# Patient Record
Sex: Female | Born: 1995 | Race: Black or African American | Hispanic: No | Marital: Single | Smoking: Never smoker
Health system: Southern US, Community
[De-identification: ages and names within clinical notes are randomized; demographics above are authoritative.]

## PROBLEM LIST (undated history)

## (undated) ENCOUNTER — Inpatient Hospital Stay (HOSPITAL_COMMUNITY): Payer: Self-pay

## (undated) ENCOUNTER — Ambulatory Visit (HOSPITAL_COMMUNITY): Source: Home / Self Care

## (undated) DIAGNOSIS — R011 Cardiac murmur, unspecified: Secondary | ICD-10-CM

## (undated) DIAGNOSIS — A749 Chlamydial infection, unspecified: Secondary | ICD-10-CM

## (undated) DIAGNOSIS — G43909 Migraine, unspecified, not intractable, without status migrainosus: Secondary | ICD-10-CM

## (undated) HISTORY — PX: NO PAST SURGERIES: SHX2092

---
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
## 2013-03-11 ENCOUNTER — Emergency Department (HOSPITAL_COMMUNITY)
Admission: EM | Admit: 2013-03-11 | Discharge: 2013-03-11 | Disposition: A | Discharge status: Home / Self Care | Payer: Self-pay | Attending: Emergency Medicine | Admitting: Emergency Medicine

## 2013-03-11 ENCOUNTER — Emergency Department (HOSPITAL_COMMUNITY): Payer: Self-pay

## 2013-03-11 ENCOUNTER — Encounter (HOSPITAL_COMMUNITY): Payer: Self-pay | Admitting: Emergency Medicine

## 2013-03-11 DIAGNOSIS — R112 Nausea with vomiting, unspecified: Secondary | ICD-10-CM | POA: Insufficient documentation

## 2013-03-11 DIAGNOSIS — N946 Dysmenorrhea, unspecified: Secondary | ICD-10-CM | POA: Insufficient documentation

## 2013-03-11 DIAGNOSIS — Z880 Allergy status to penicillin: Secondary | ICD-10-CM | POA: Insufficient documentation

## 2013-03-11 DIAGNOSIS — R102 Pelvic and perineal pain: Secondary | ICD-10-CM

## 2013-03-11 DIAGNOSIS — R011 Cardiac murmur, unspecified: Secondary | ICD-10-CM | POA: Insufficient documentation

## 2013-03-11 DIAGNOSIS — N949 Unspecified condition associated with female genital organs and menstrual cycle: Secondary | ICD-10-CM | POA: Insufficient documentation

## 2013-03-11 DIAGNOSIS — Z3202 Encounter for pregnancy test, result negative: Secondary | ICD-10-CM | POA: Insufficient documentation

## 2013-03-11 HISTORY — DX: Cardiac murmur, unspecified: R01.1

## 2013-03-11 LAB — CBC WITH DIFFERENTIAL/PLATELET
Basophils Absolute: 0 10*3/uL (ref 0.0–0.1)
Basophils Relative: 0 % (ref 0–1)
Eosinophils Absolute: 0 10*3/uL (ref 0.0–0.7)
Eosinophils Relative: 0 % (ref 0–5)
HCT: 41.4 % (ref 36.0–46.0)
Lymphs Abs: 0.9 10*3/uL (ref 0.7–4.0)
Neutrophils Relative %: 83 % — ABNORMAL HIGH (ref 43–77)
Platelets: 285 10*3/uL (ref 150–400)
RDW: 11.8 % (ref 11.5–15.5)

## 2013-03-11 LAB — WET PREP, GENITAL
Clue Cells Wet Prep HPF POC: NONE SEEN
Trich, Wet Prep: NONE SEEN
WBC, Wet Prep HPF POC: NONE SEEN

## 2013-03-11 LAB — BASIC METABOLIC PANEL
BUN: 10 mg/dL (ref 6–23)
Calcium: 10.1 mg/dL (ref 8.4–10.5)
Creatinine, Ser: 0.69 mg/dL (ref 0.50–1.10)
GFR calc Af Amer: >90 mL/min (ref >90)
GFR calc non Af Amer: >90 mL/min (ref >90)
Potassium: 3.9 mEq/L (ref 3.5–5.1)

## 2013-03-11 LAB — URINE MICROSCOPIC-ADD ON

## 2013-03-11 LAB — PREGNANCY, URINE: Preg Test, Ur: NEGATIVE

## 2013-03-11 LAB — URINALYSIS, ROUTINE W REFLEX MICROSCOPIC: Ketones, ur: 40 mg/dL — AB

## 2013-03-11 MED ORDER — MORPHINE SULFATE 4 MG/ML IJ SOLN
4.0000 mg | Freq: Once | INTRAMUSCULAR | Status: AC
  Administered 2013-03-11: 4 mg via INTRAVENOUS
  Filled 2013-03-11: qty 1

## 2013-03-11 MED ORDER — ONDANSETRON HCL 4 MG PO TABS: 4.0000 mg | ORAL_TABLET | Freq: Four times a day (QID) | ORAL | Status: DC

## 2013-03-11 MED ORDER — NAPROXEN 500 MG PO TABS: 500.0000 mg | ORAL_TABLET | Freq: Two times a day (BID) | ORAL | Status: DC

## 2013-03-11 NOTE — ED Provider Notes (Signed)
CSN: [id number]     Arrival date & time 03/11/13  1155 History   CC: ABD Pain  First MD Initiated Contact with Patient 03/11/13 1208     Chief Complaint  Patient presents with  . Emesis  . Abdominal Pain   (Consider location/radiation/quality/duration/timing/severity/associated sxs/prior Treatment) HPI patient is a 17 year old American female presents emergency department with chief complaint of abdominal pain and pelvic pain. She states the pain began approximately 3-4 days ago and has been increasing over the last few days. She states she normally gets pain during her menses however she is not currently menstruating and is late on her period. She says her periods are irregular and she took a pregnancy test last week but was non determinant.  Probably the pain is an ache and it is intermittent pelvis. Pain is rated at a 5/10 she has taken Tylenol with minimal relief. She denies fever, chills, chest pain, shortness of breath, she does report nausea with one episode of emesis nonbloody nonbilious, she denies frequency urgency dysuria, vaginal discharge or bleeding, or other associated symptoms.  Past Medical History  Diagnosis Date  . Heart murmur    Past Surgical History  Procedure Laterality Date  . Ovarian cyst removal     History reviewed. No pertinent family history. History  Substance Use Topics  . Smoking status: Never Smoker   . Smokeless tobacco: Not on file  . Alcohol Use: No   OB History   Grav Para Term Preterm Abortions TAB SAB Ect Mult Living                 Review of Systems  Allergies  Penicillins  Home Medications   Current Outpatient Rx  Name  Route  Sig  Dispense  Refill  . naproxen (NAPROSYN) 500 MG tablet   Oral   Take 1 tablet (500 mg total) by mouth 2 (two) times daily.   30 tablet   0   . ondansetron (ZOFRAN) 4 MG tablet   Oral   Take 1 tablet (4 mg total) by mouth every 6 (six) hours.   12 tablet   0    BP 113/72  Pulse 60  Temp(Src)  98.6 F (37 C) (Oral)  Resp 18  Ht 5\' 4"  (1.626 m)  SpO2 99% Physical Exam  Constitutional: She is oriented to person, place, and time. She appears well-developed and well-nourished.  HENT:  Head: Normocephalic and atraumatic.  Eyes: Conjunctivae are normal. Pupils are equal, round, and reactive to light.  Neck: Normal range of motion. Neck supple.  Cardiovascular: Normal rate, regular rhythm, normal heart sounds and intact distal pulses.   Pulmonary/Chest: Effort normal and breath sounds normal.  Abdominal: She exhibits no distension and no mass. There is tenderness. There is no rebound and no guarding.  Abdomen soft, no guarding rigidity or masses, no hepatosplenomegaly, but there is palpation McBurney's point, negative Murphy's,  Genitourinary: No vaginal discharge found.  Pelvic exam performed with a chaperone. Normal external female genitalia. Blood noted in the vaginal vault. Blood noted cervix. No active hemorrhaging. No lesions or discharge. Cervix closed. Bimanual exam with uterine tenderness to palpation and right greater than left adnexal tenderness to palpation. No masses appreciated  Musculoskeletal: Normal range of motion.  Neurological: She is alert and oriented to person, place, and time. She has normal reflexes.  Skin: Skin is warm and dry.   general patient is in no acute distress, she is a regular from bathroom and no distress is noted.  ED Course  17 year old with history of ovarian cysts. Presents with pelvic pain. Plan to obtain a hCG and perform pelvic exam and disobeys onto Procedures (including critical care time)  Labs Reviewed  CBC WITH DIFFERENTIAL - Abnormal; Notable for the following:    Neutrophils Relative % 83 (*)    All other components within normal limits  BASIC METABOLIC PANEL - Abnormal; Notable for the following:    Glucose, Bld 102 (*)    All other components within normal limits  URINALYSIS, ROUTINE W REFLEX MICROSCOPIC - Abnormal; Notable for  the following:    Color, Urine RED (*)    APPearance TURBID (*)    Hgb urine dipstick LARGE (*)    Ketones, ur 40 (*)    Protein, ur 100 (*)    Leukocytes, UA MODERATE (*)    All other components within normal limits  URINE MICROSCOPIC-ADD ON - Abnormal; Notable for the following:    Squamous Epithelial / LPF FEW (*)    All other components within normal limits  WET PREP, GENITAL  GC/CHLAMYDIA PROBE AMP  PREGNANCY, URINE   US Transvaginal Non-ob  03/11/2013   *RADIOLOGY REPORT*  Clinical Data:  17 year old female with pelvic pain, right greater than left.  TRANSABDOMINAL AND TRANSVAGINAL ULTRASOUND OF PELVIS DOPPLER ULTRASOUND OF OVARIES  Technique:  Both transabdominal and transvaginal ultrasound examinations of the pelvis were performed. Transabdominal technique was performed for global imaging of the pelvis including uterus, ovaries, adnexal regions, and pelvic cul-de-sac.  It was necessary to proceed with endovaginal exam following the transabdominal exam to visualize the ovaries and endometrium.  Color and duplex Doppler ultrasound was utilized to evaluate blood flow to the ovaries.  Comparison: None  Findings:  Uterus:  The uterus is anteverted measuring 7.9 x 3.8 x 3.9 cm.  No focal uterine abnormalities are identified.  Endometrium: The endometrium is normal in size and appearance measuring 7 mm in thickness.  Right ovary: The right ovary is unremarkable measuring 3.7 x 2.4 x 2.6 cm.  Normal color, Doppler and arterial/venous flow noted.  Left ovary: The left ovary is unremarkable measuring 2.6 x 1.6 x 1.3 cm.  Normal color, Doppler arterial/venous flow noted.  Pulsed Doppler evaluation demonstrates normal low-resistance arterial and venous waveforms in both ovaries.  There is no evidence of free fluid or adnexal mass.  IMPRESSION:  Normal exam.  No evidence of pelvic mass or other significant abnormality  No sonographic evidence for ovarian torsion.   Original Report Authenticated By: Harmon Pier, M.D.   US Pelvis Complete  03/11/2013   *RADIOLOGY REPORT*  Clinical Data:  17 year old female with pelvic pain, right greater than left.  TRANSABDOMINAL AND TRANSVAGINAL ULTRASOUND OF PELVIS DOPPLER ULTRASOUND OF OVARIES  Technique:  Both transabdominal and transvaginal ultrasound examinations of the pelvis were performed. Transabdominal technique was performed for global imaging of the pelvis including uterus, ovaries, adnexal regions, and pelvic cul-de-sac.  It was necessary to proceed with endovaginal exam following the transabdominal exam to visualize the ovaries and endometrium.  Color and duplex Doppler ultrasound was utilized to evaluate blood flow to the ovaries.  Comparison: None  Findings:  Uterus:  The uterus is anteverted measuring 7.9 x 3.8 x 3.9 cm.  No focal uterine abnormalities are identified.  Endometrium: The endometrium is normal in size and appearance measuring 7 mm in thickness.  Right ovary: The right ovary is unremarkable measuring 3.7 x 2.4 x 2.6 cm.  Normal color, Doppler and arterial/venous flow noted.  Left  ovary: The left ovary is unremarkable measuring 2.6 x 1.6 x 1.3 cm.  Normal color, Doppler arterial/venous flow noted.  Pulsed Doppler evaluation demonstrates normal low-resistance arterial and venous waveforms in both ovaries.  There is no evidence of free fluid or adnexal mass.  IMPRESSION:  Normal exam.  No evidence of pelvic mass or other significant abnormality  No sonographic evidence for ovarian torsion.   Original Report Authenticated By: Harmon Pier, M.D.   [place] Art/ven Flow Abd Pelv Doppler  03/11/2013   *RADIOLOGY REPORT*  Clinical Data:  17 year old female with pelvic pain, right greater than left.  TRANSABDOMINAL AND TRANSVAGINAL ULTRASOUND OF PELVIS DOPPLER ULTRASOUND OF OVARIES  Technique:  Both transabdominal and transvaginal ultrasound examinations of the pelvis were performed. Transabdominal technique was performed for global imaging of the pelvis including  uterus, ovaries, adnexal regions, and pelvic cul-de-sac.  It was necessary to proceed with endovaginal exam following the transabdominal exam to visualize the ovaries and endometrium.  Color and duplex Doppler ultrasound was utilized to evaluate blood flow to the ovaries.  Comparison: None  Findings:  Uterus:  The uterus is anteverted measuring 7.9 x 3.8 x 3.9 cm.  No focal uterine abnormalities are identified.  Endometrium: The endometrium is normal in size and appearance measuring 7 mm in thickness.  Right ovary: The right ovary is unremarkable measuring 3.7 x 2.4 x 2.6 cm.  Normal color, Doppler and arterial/venous flow noted.  Left ovary: The left ovary is unremarkable measuring 2.6 x 1.6 x 1.3 cm.  Normal color, Doppler arterial/venous flow noted.  Pulsed Doppler evaluation demonstrates normal low-resistance arterial and venous waveforms in both ovaries.  There is no evidence of free fluid or adnexal mass.  IMPRESSION:  Normal exam.  No evidence of pelvic mass or other significant abnormality  No sonographic evidence for ovarian torsion.   Original Report Authenticated By: Harmon Pier, M.D.   1. Pelvic pain   2. Dysmenorrhea     Results for orders placed during the hospital encounter of 03/11/13  WET PREP, GENITAL      Result Value Range   Yeast Wet Prep HPF POC NONE SEEN  NONE SEEN   Trich, Wet Prep NONE SEEN  NONE SEEN   Clue Cells Wet Prep HPF POC NONE SEEN  NONE SEEN   WBC, Wet Prep HPF POC NONE SEEN  NONE SEEN  CBC WITH DIFFERENTIAL      Result Value Range   WBC 6.9  4.0 - 10.5 K/uL   RBC 4.31  3.87 - 5.11 MIL/uL   Hemoglobin 14.4  12.0 - 15.0 g/dL   HCT 65.7  84.6 - 96.2 %   MCV 96.1  78.0 - 100.0 fL   MCH 33.4  26.0 - 34.0 pg   MCHC 34.8  30.0 - 36.0 g/dL   RDW 95.2  84.1 - 32.4 %   Platelets 285  150 - 400 K/uL   Neutrophils Relative % 83 (*) 43 - 77 %   Neutro Abs 5.7  1.7 - 7.7 K/uL   Lymphocytes Relative 13  12 - 46 %   Lymphs Abs 0.9  0.7 - 4.0 K/uL   Monocytes Relative 3   3 - 12 %   Monocytes Absolute 0.2  0.1 - 1.0 K/uL   Eosinophils Relative 0  0 - 5 %   Eosinophils Absolute 0.0  0.0 - 0.7 K/uL   Basophils Relative 0  0 - 1 %   Basophils Absolute 0.0  0.0 - 0.1 K/uL  BASIC METABOLIC PANEL      Result Value Range   Sodium 140  135 - 145 mEq/L   Potassium 3.9  3.5 - 5.1 mEq/L   Chloride 102  96 - 112 mEq/L   CO2 24  19 - 32 mEq/L   Glucose, Bld 102 (*) 70 - 99 mg/dL   BUN 10  6 - 23 mg/dL   Creatinine, Ser 1.61  0.50 - 1.10 mg/dL   Calcium 09.6  8.4 - 04.5 mg/dL   GFR calc non Af Amer >90  >90 mL/min   GFR calc Af Amer >90  >90 mL/min  PREGNANCY, URINE      Result Value Range   Preg Test, Ur NEGATIVE  NEGATIVE  URINALYSIS, ROUTINE W REFLEX MICROSCOPIC      Result Value Range   Color, Urine RED (*) YELLOW   APPearance TURBID (*) CLEAR   Specific Gravity, Urine 1.030  1.005 - 1.030   pH 7.5  5.0 - 8.0   Glucose, UA NEGATIVE  NEGATIVE mg/dL   Hgb urine dipstick LARGE (*) NEGATIVE   Bilirubin Urine NEGATIVE  NEGATIVE   Ketones, ur 40 (*) NEGATIVE mg/dL   Protein, ur 409 (*) NEGATIVE mg/dL   Urobilinogen, UA 1.0  0.0 - 1.0 mg/dL   Nitrite NEGATIVE  NEGATIVE   Leukocytes, UA MODERATE (*) NEGATIVE  URINE MICROSCOPIC-ADD ON      Result Value Range   Squamous Epithelial / LPF FEW (*) RARE   WBC, UA 7-10  <3 WBC/hpf   RBC / HPF TOO NUMEROUS TO COUNT  <3 RBC/hpf   Bacteria, UA RARE  RARE     MDM  17 year old female with pelvic pain. Workup negative for torsion, ectopic pregnancy, SBI, surgical abdomen, or emergent issue. I suspect her symptoms are multifactorial but likely related to her current menses.  Her vital signs are stable, lab work normal, ultrasound was negative.MDM Number of Diagnoses or Management Options   The patient appears reasonably screened and/or stabilized for discharge and I doubt any other medical condition or other [facility] requiring further screening, evaluation, or treatment in the ED at this time prior to discharge. Plan to  discharge with pain medications NSAIDs and Zofran. ER precautions were given the patient agrees with the plan.  Darlys Gales, MD 03/11/13 2145

## 2013-03-11 NOTE — ED Notes (Signed)
Pt c/o mid to lower abd pain starting today; pt sts LMP was beginning of July

## 2013-03-12 LAB — GC/CHLAMYDIA PROBE AMP: GC Probe RNA: NEGATIVE

## 2013-05-23 ENCOUNTER — Encounter (HOSPITAL_COMMUNITY): Payer: Self-pay

## 2013-05-23 ENCOUNTER — Inpatient Hospital Stay (HOSPITAL_COMMUNITY): Payer: Medicaid - Out of State

## 2013-05-23 ENCOUNTER — Inpatient Hospital Stay (HOSPITAL_COMMUNITY)
Admission: AD | Admit: 2013-05-23 | Discharge: 2013-05-23 | Disposition: A | Discharge status: Home / Self Care | Payer: Medicaid - Out of State | Source: Ambulatory Visit | Attending: Obstetrics & Gynecology | Admitting: Obstetrics & Gynecology

## 2013-05-23 DIAGNOSIS — A499 Bacterial infection, unspecified: Secondary | ICD-10-CM | POA: Insufficient documentation

## 2013-05-23 DIAGNOSIS — B9689 Other specified bacterial agents as the cause of diseases classified elsewhere: Secondary | ICD-10-CM | POA: Insufficient documentation

## 2013-05-23 DIAGNOSIS — O34599 Maternal care for other abnormalities of gravid uterus, unspecified trimester: Secondary | ICD-10-CM | POA: Insufficient documentation

## 2013-05-23 DIAGNOSIS — O2390 Unspecified genitourinary tract infection in pregnancy, unspecified trimester: Secondary | ICD-10-CM | POA: Insufficient documentation

## 2013-05-23 DIAGNOSIS — R109 Unspecified abdominal pain: Secondary | ICD-10-CM | POA: Insufficient documentation

## 2013-05-23 DIAGNOSIS — N760 Acute vaginitis: Secondary | ICD-10-CM

## 2013-05-23 DIAGNOSIS — Z3490 Encounter for supervision of normal pregnancy, unspecified, unspecified trimester: Secondary | ICD-10-CM

## 2013-05-23 DIAGNOSIS — N83209 Unspecified ovarian cyst, unspecified side: Secondary | ICD-10-CM

## 2013-05-23 LAB — URINALYSIS, ROUTINE W REFLEX MICROSCOPIC
Bilirubin Urine: NEGATIVE
Glucose, UA: NEGATIVE mg/dL
Ketones, ur: 15 mg/dL — AB
Protein, ur: NEGATIVE mg/dL
pH: 7.5 (ref 5.0–8.0)

## 2013-05-23 LAB — WET PREP, GENITAL: Trich, Wet Prep: NONE SEEN

## 2013-05-23 LAB — HCG, QUANTITATIVE, PREGNANCY: hCG, Beta Chain, Quant, S: 46472 m[IU]/mL — ABNORMAL HIGH (ref <5)

## 2013-05-23 LAB — CBC
MCHC: 35.3 g/dL (ref 30.0–36.0)
Platelets: 281 10*3/uL (ref 150–400)
RDW: 11.6 % (ref 11.5–15.5)
WBC: 4.2 10*3/uL (ref 4.0–10.5)

## 2013-05-23 LAB — ABO/RH: ABO/RH(D): B NEG

## 2013-05-23 MED ORDER — OXYCODONE-ACETAMINOPHEN 5-325 MG PO TABS: 1.0000 | ORAL_TABLET | ORAL | Status: DC | PRN

## 2013-05-23 MED ORDER — PROMETHAZINE HCL 25 MG PO TABS: 25.0000 mg | ORAL_TABLET | Freq: Four times a day (QID) | ORAL | Status: DC | PRN

## 2013-05-23 MED ORDER — ONDANSETRON HCL 4 MG PO TABS: 4.0000 mg | ORAL_TABLET | Freq: Three times a day (TID) | ORAL | Status: DC | PRN

## 2013-05-23 MED ORDER — ONDANSETRON 4 MG PO TBDP
4.0000 mg | ORAL_TABLET | Freq: Once | ORAL | Status: AC
  Administered 2013-05-23: 4 mg via ORAL
  Filled 2013-05-23: qty 1

## 2013-05-23 MED ORDER — METRONIDAZOLE 500 MG PO TABS: 500.0000 mg | ORAL_TABLET | Freq: Two times a day (BID) | ORAL | Status: DC

## 2013-05-23 NOTE — MAU Note (Signed)
Lower abd cramping is late for period.  No discharge.  Unsure if pregnant.

## 2013-05-23 NOTE — MAU Provider Note (Signed)
History     CSN: [id number]  Arrival date and time: 05/23/13 1311   First Provider Initiated Contact with Patient 05/23/13 1509      Chief Complaint  Patient presents with  . Abdominal Pain  . possible pregnant    HPI  Brandy [name] is a 17 y/o G1P0 w/ EGA of [redacted] weeks and 3 days who presents today with c/o nausea, abdominal cramping, and missed periods; LMP on 9/6.  Pt. States that early in October she began experiencing some mild abdominal cramping which she attributed to normal cramping that she experiences prior to normal menstruations.  Her cramping continued for another week and a half, but she still did not have her period.  She took a home pregnancy test 1-1.5 weeks ago, which was positive.  However, she did not think she was actually pregnant because a friend of hers told her they could be wrong. Pt. Also began experiencing N/V that began about a week ago, with once episode of emesis on 10/16.  UPT (+) in MAU, pt. Moderately surprised with results, but is okay with the news. She is unsure of her plans for prenatal care and is not taking PNV.  She denies any smoking, alcohol or drug use.   Her abdominal cramping is intermittent, and feels similar to cramping during menstruation. Brandy [name] states that the cramping is a 5-6/10 at its worst, but is currently a 0/10. She cannot correlate the cramping to any movement or activity, and she denies any unusual or change to activity level. She currently is experiencing mild nausea, but no other sx.  She reports decreased appetite for 2 weeks and minimal water intake, ~3-4 glasses /day; she last ate this morning around 10am.  She denies any CP, SOB, diarrhea, constipation, fever/chills, vaginal bleeding, vaginal discharge, or dysuria. She denies any pertinent medical history, but does report that she was recently diagnosed with a heart murmur in June of 2014, for which she saw a cardiologist; she is unsure of details, who she saw, or if she has to  f/u.    OB History   Grav Para Term Preterm Abortions TAB SAB Ect Mult Living   1               Past Medical History  Diagnosis Date  . Heart murmur     History reviewed. No pertinent past surgical history.  History reviewed. No pertinent family history.  History  Substance Use Topics  . Smoking status: Never Smoker   . Smokeless tobacco: Not on file  . Alcohol Use: No    Allergies:  Allergies  Allergen Reactions  . Penicillins Hives    No prescriptions prior to admission   Results for orders placed during the hospital encounter of 05/23/13 (from the past 24 hour(s))  URINALYSIS, ROUTINE W REFLEX MICROSCOPIC     Status: Abnormal   Collection Time    05/23/13  1:25 PM      Result Value Range   Color, Urine YELLOW  YELLOW   APPearance CLOUDY (*) CLEAR   Specific Gravity, Urine 1.025  1.005 - 1.030   pH 7.5  5.0 - 8.0   Glucose, UA NEGATIVE  NEGATIVE mg/dL   Hgb urine dipstick NEGATIVE  NEGATIVE   Bilirubin Urine NEGATIVE  NEGATIVE   Ketones, ur 15 (*) NEGATIVE mg/dL   Protein, ur NEGATIVE  NEGATIVE mg/dL   Urobilinogen, UA 2.0 (*) 0.0 - 1.0 mg/dL   Nitrite NEGATIVE  NEGATIVE   Leukocytes, UA  NEGATIVE  NEGATIVE  POCT PREGNANCY, URINE     Status: Abnormal   Collection Time    05/23/13  1:46 PM      Result Value Range   Preg Test, Ur POSITIVE (*) NEGATIVE  WET PREP, GENITAL     Status: Abnormal   Collection Time    05/23/13  4:21 PM      Result Value Range   Yeast Wet Prep HPF POC NONE SEEN  NONE SEEN   Trich, Wet Prep NONE SEEN  NONE SEEN   Clue Cells Wet Prep HPF POC MODERATE (*) NONE SEEN   WBC, Wet Prep HPF POC FEW (*) NONE SEEN  HCG, QUANTITATIVE, PREGNANCY     Status: Abnormal   Collection Time    05/23/13  4:41 PM      Result Value Range   hCG, Beta Chain, Sharene Butters, [place] [address] (*) <5 mIU/mL  CBC     Status: Abnormal   Collection Time    05/23/13  4:42 PM      Result Value Range   WBC 4.2  4.0 - 10.5 K/uL   RBC 3.74 (*) 3.87 - 5.11 MIL/uL    Hemoglobin 12.3  12.0 - 15.0 g/dL   HCT 19.1 (*) 47.8 - 29.5 %   MCV 93.0  78.0 - 100.0 fL   MCH 32.9  26.0 - 34.0 pg   MCHC 35.3  30.0 - 36.0 g/dL   RDW 62.1  30.8 - 65.7 %   Platelets 281  150 - 400 K/uL  ABO/RH     Status: None   Collection Time    05/23/13  4:42 PM      Result Value Range   ABO/RH(D) B NEG     US Ob Comp Less 14 Wks  05/23/2013   CLINICAL DATA:  Positive pregnancy test, pelvic pain  EXAM: OBSTETRIC <14 WK ULTRASOUND  TECHNIQUE: Transabdominal ultrasound was performed for evaluation of the gestation as well as the maternal uterus and adnexal regions.  COMPARISON:  None for this pregnancy.  FINDINGS: Intrauterine gestational sac: Visualized/normal in shape.  Yolk sac:  Visualized  Embryo:  Visualized  Cardiac Activity: Visualized  Heart Rate: 125 bpm  CRL:   7  mm   6 w 5d                  [place] EDC: 01/11/14  Maternal uterus/adnexae: The right ovary is normal. Simple appearing left ovarian cyst measures 4.9 x 4.7 x 4.4 cm.  IMPRESSION: Single live intrauterine gestation with MEASUREMENTS by crown-rump length concordant with assigned gestational age of [redacted] weeks 3 days by LMP.  4.9 cm simple appearing left ovarian cyst. If the patient is symptomatic on the left, this theoretically could act as a lead point for ovarian torsion, and close followup may be needed as clinically indicated. Otherwise, this can be reassessed at the time of fetal anatomic survey at 18-[redacted] weeks gestational age.   Electronically Signed   By: Christiana Pellant M.D.   On: 05/23/2013 17:13    US Ob Comp Less 14 Wks  05/23/2013   CLINICAL DATA:  Positive pregnancy test, pelvic pain  EXAM: OBSTETRIC <14 WK ULTRASOUND  TECHNIQUE: Transabdominal ultrasound was performed for evaluation of the gestation as well as the maternal uterus and adnexal regions.  COMPARISON:  None for this pregnancy.  FINDINGS: Intrauterine gestational sac: Visualized/normal in shape.  Yolk sac:  Visualized  Embryo:  Visualized  Cardiac Activity:  Visualized  Heart Rate: 125  bpm  CRL:   7  mm   6 w 5d                  [place] EDC: 01/11/14  Maternal uterus/adnexae: The right ovary is normal. Simple appearing left ovarian cyst measures 4.9 x 4.7 x 4.4 cm.  IMPRESSION: Single live intrauterine gestation with MEASUREMENTS by crown-rump length concordant with assigned gestational age of [redacted] weeks 3 days by LMP.  4.9 cm simple appearing left ovarian cyst. If the patient is symptomatic on the left, this theoretically could act as a lead point for ovarian torsion, and close followup may be needed as clinically indicated. Otherwise, this can be reassessed at the time of fetal anatomic survey at 18-[redacted] weeks gestational age.   Electronically Signed   By: Christiana Pellant M.D.   On: 05/23/2013 17:13   US Ob Transvaginal  05/23/2013   CLINICAL DATA:  Patient [redacted] weeks pregnant by ultrasound same day. Left ovarian cyst.  EXAM: TRANSVAGINAL ULTRASOUND OF PELVIS  DOPPLER ULTRASOUND OF OVARIES  TECHNIQUE: Transvaginal ultrasound examination of the pelvis was performed including evaluation of the uterus, ovaries, adnexal regions, and pelvic cul-de-sac.  Color and duplex Doppler ultrasound was utilized to evaluate blood flow to the ovaries.  COMPARISON:  Transabdominal ultrasound 05/23/2013  FINDINGS: Uterus  Measurements: Single intrauterine gestation with normal cardiac activity with heart at 124 beats per min. Crown-rump length is 0.65 mm for a estimated gestational age is 6 weeks 4 days.  Right ovary  Measurements: Normal. Normal arterial and venous waveforms.  Left ovary  Measurements: Large anechoic cyst associated with the left ovary measuring 5.7 x 4.1 x 4.7 cm. There is normal color Doppler flow to the left ovary. Normal arterial and venous waveforms in the left ovary.  Pulsed Doppler evaluation demonstrates normal low-resistance arterial and venous waveforms in both ovaries.  IMPRESSION: 1. Normal arterial and venous blood flow to the left and right ovary. No evidence of  torsion.  2. Large anechoic cyst associated with the left ovary measuring 5.7 cm.  3. Single intrauterine gestation with normal cardiac activity described on comparison abdominal ultrasound same day.   Electronically Signed   By: Genevive Bi M.D.   On: 05/23/2013 18:05   [place] Art/ven Flow Abd Pelv Doppler  05/23/2013   CLINICAL DATA:  Patient [redacted] weeks pregnant by ultrasound same day. Left ovarian cyst.  EXAM: TRANSVAGINAL ULTRASOUND OF PELVIS  DOPPLER ULTRASOUND OF OVARIES  TECHNIQUE: Transvaginal ultrasound examination of the pelvis was performed including evaluation of the uterus, ovaries, adnexal regions, and pelvic cul-de-sac.  Color and duplex Doppler ultrasound was utilized to evaluate blood flow to the ovaries.  COMPARISON:  Transabdominal ultrasound 05/23/2013  FINDINGS: Uterus  Measurements: Single intrauterine gestation with normal cardiac activity with heart at 124 beats per min. Crown-rump length is 0.65 mm for a estimated gestational age is 6 weeks 4 days.  Right ovary  Measurements: Normal. Normal arterial and venous waveforms.  Left ovary  Measurements: Large anechoic cyst associated with the left ovary measuring 5.7 x 4.1 x 4.7 cm. There is normal color Doppler flow to the left ovary. Normal arterial and venous waveforms in the left ovary.  Pulsed Doppler evaluation demonstrates normal low-resistance arterial and venous waveforms in both ovaries.  IMPRESSION: 1. Normal arterial and venous blood flow to the left and right ovary. No evidence of torsion.  2. Large anechoic cyst associated with the left ovary measuring 5.7 cm.  3. Single intrauterine gestation with normal  cardiac activity described on comparison abdominal ultrasound same day.   Electronically Signed   By: Genevive Bi M.D.   On: 05/23/2013 18:05    Review of Systems  Constitutional: Negative for fever and chills.  Respiratory: Negative for cough.   Cardiovascular: Negative for chest pain.  Gastrointestinal: Positive for  nausea. Negative for heartburn, vomiting, abdominal pain, diarrhea and constipation.  Genitourinary: Negative for dysuria.  Neurological: Negative for headaches.   Physical Exam   Blood pressure 112/60, pulse 83, temperature 99 F (37.2 C), temperature source Oral, resp. rate 16, height 5\' 3"  (1.6 m), weight 125 lb 6.4 oz (56.881 kg), last menstrual period 04/08/2013.  Physical Exam  Constitutional: She is oriented to person, place, and time. She appears well-developed and well-nourished.  HENT:  Head: Normocephalic.  Neck: Neck supple.  Cardiovascular: Normal rate, regular rhythm and intact distal pulses.  Exam reveals no gallop and no friction rub.   Murmur heard. Late diastolic III/VI murmur heard best at RUSB  Respiratory: Effort normal and breath sounds normal. She has no wheezes.  GI: Soft. Bowel sounds are normal. She exhibits no distension and no mass. There is tenderness. There is no rebound and no guarding.  + suprapubic tenderness  + left lower quadrant pain   Genitourinary: No vaginal discharge found.  Speculum exam: Vagina - Small amount of creamy discharge, no odor Cervix - No contact bleeding Bimanual exam: Cervix closed Uterus non tender, normal size for gestational age Adnexa non tender, no masses bilaterally, suprapubic tenderness.  GC/Chlam, wet prep done Chaperone present for exam.   Neurological: She is alert and oriented to person, place, and time.  Skin: Skin is warm.    MAU Course  Procedures None   MDM Wet prep GC/Chlamydia  CBC  ABO/RH Beta Hcg [place]   B negative blood type   Given Dr. Amanda Cockayne report; I have sent patient back for doppler of left ovary.  Consulted with Dr. Erin Fulling; plan of care discussed  Assessment and Plan  A- IUP with cardiac activity US done 10/21 Left ovarian cyst 4.9 cm, no sign of ovarian torsion  Bacterial vaginosis   P- Discharge home RX: percocet 5/325        Flagyl 500 mg BID # 14        Zofran          Phenergan  Return to MAU with worsening abdominal pain, or vaginal bleeding  Return to MAU with worsening Nausea and vomiting   Christiana Fuchs, Student-PA 05/23/2013 3:44 PM  Evaluation and management procedures were performed by the student above under my supervision and collaboration. I have reviewed the note and chart, and I agree with the management and plan.  RASCH, JENNIFER IRENE FNP-C  05/23/2013, 6:23 PM

## 2013-05-23 NOTE — MAU Provider Note (Signed)
Attestation of Attending Supervision of Advanced Practitioner (CNM/NP): Evaluation and management procedures were performed by the Advanced Practitioner under my supervision and collaboration.  I have reviewed the Advanced Practitioner's note and chart, and I agree with the management and plan.  HARRAWAY-SMITH, Raven Furnas 7:17 PM

## 2013-05-23 NOTE — MAU Note (Signed)
Patient is in with c/o lower abdominal cramping and a positive home pregnancy test. She denies vaginal bleeding or abnormal vaginal discharge. Patient will like an ultrasound to determine how far along she is. She states that she will get prenatal care from Dr Gaynell Face once she gets her medicaid card.

## 2013-05-24 LAB — GC/CHLAMYDIA PROBE AMP
CT Probe RNA: NEGATIVE
GC Probe RNA: NEGATIVE

## 2014-03-28 ENCOUNTER — Encounter (HOSPITAL_COMMUNITY): Payer: Self-pay | Admitting: *Deleted

## 2014-04-10 ENCOUNTER — Inpatient Hospital Stay (HOSPITAL_COMMUNITY)
Admission: AD | Admit: 2014-04-10 | Discharge: 2014-04-11 | Disposition: A | Discharge status: Home / Self Care | Payer: Medicaid - Out of State | Source: Ambulatory Visit | Attending: Family Medicine | Admitting: Family Medicine

## 2014-04-10 DIAGNOSIS — Z3202 Encounter for pregnancy test, result negative: Secondary | ICD-10-CM | POA: Insufficient documentation

## 2014-04-10 DIAGNOSIS — R109 Unspecified abdominal pain: Secondary | ICD-10-CM | POA: Insufficient documentation

## 2014-04-11 ENCOUNTER — Encounter (HOSPITAL_COMMUNITY): Payer: Self-pay | Admitting: *Deleted

## 2014-04-11 DIAGNOSIS — Z3202 Encounter for pregnancy test, result negative: Secondary | ICD-10-CM | POA: Diagnosis not present

## 2014-04-11 DIAGNOSIS — R109 Unspecified abdominal pain: Secondary | ICD-10-CM | POA: Diagnosis not present

## 2014-04-11 LAB — URINALYSIS, ROUTINE W REFLEX MICROSCOPIC
Bilirubin Urine: NEGATIVE
GLUCOSE, UA: NEGATIVE mg/dL
HGB URINE DIPSTICK: NEGATIVE
Ketones, ur: NEGATIVE mg/dL
LEUKOCYTES UA: NEGATIVE
Nitrite: NEGATIVE
Protein, ur: NEGATIVE mg/dL
SPECIFIC GRAVITY, URINE: 1.02 (ref 1.005–1.030)
Urobilinogen, UA: 1 mg/dL (ref 0.0–1.0)
pH: 6 (ref 5.0–8.0)

## 2014-04-11 LAB — POCT PREGNANCY, URINE: PREG TEST UR: NEGATIVE

## 2014-04-11 NOTE — MAU Provider Note (Signed)
History     CSN: [id number]  Arrival date and time: 04/10/14 2349   First Provider Initiated Contact with Patient 04/11/14 0340      No chief complaint on file.  HPI  Brandy [name] is a 18 y.o. G1P1001 who presents today with lower abdominal pain and missed period. She states that she had a depo shot at the end of May, and was due for one at the end of August. She did not get that shot. She was concerned she was pregnant. She did not take a pregnancy test at home. She is here today to find out if she is pregnant.   Past Medical History  Diagnosis Date  . Heart murmur     History reviewed. No pertinent past surgical history.  Family History  Problem Relation Age of Onset  . Asthma Mother     History  Substance Use Topics  . Smoking status: Never Smoker   . Smokeless tobacco: Never Used  . Alcohol Use: No    Allergies:  Allergies  Allergen Reactions  . Penicillins Hives    Prescriptions prior to admission  Medication Sig Dispense Refill  . metroNIDAZOLE (FLAGYL) 500 MG tablet Take 1 tablet (500 mg total) by mouth 2 (two) times daily.  14 tablet  0  . ondansetron (ZOFRAN) 4 MG tablet Take 1 tablet (4 mg total) by mouth every 8 (eight) hours as needed for nausea.  20 tablet  0  . oxyCODONE-acetaminophen (PERCOCET/ROXICET) 5-325 MG per tablet Take 1 tablet by mouth every 4 (four) hours as needed for pain.  10 tablet  0  . promethazine (PHENERGAN) 25 MG tablet Take 1 tablet (25 mg total) by mouth every 6 (six) hours as needed for nausea.  20 tablet  0    ROS Physical Exam   Blood pressure 112/71, pulse 86, temperature 98.7 F (37.1 C), temperature source Oral, resp. rate 18, height  (1.575 m), weight 55.112 kg (121 lb 8 oz), last menstrual period 03/03/2014, unknown if currently breastfeeding.  Physical Exam  Nursing note and vitals reviewed. Constitutional: She appears well-developed. No distress.  Cardiovascular: Normal rate.   Respiratory: Effort normal.   GI: Soft. There is no tenderness.  Neurological: She is alert.  Skin: Skin is warm and dry.  Psychiatric: She has a normal mood and affect.    MAU Course  Procedures  Results for orders placed during the hospital encounter of 04/10/14 (from the past 24 hour(s))  URINALYSIS, ROUTINE W REFLEX MICROSCOPIC     Status: None   Collection Time    04/11/14  1:36 AM      Result Value Ref Range   Color, Urine YELLOW  YELLOW   APPearance CLEAR  CLEAR   Specific Gravity, Urine 1.020  1.005 - 1.030   pH 6.0  5.0 - 8.0   Glucose, UA NEGATIVE  NEGATIVE mg/dL   Hgb urine dipstick NEGATIVE  NEGATIVE   Bilirubin Urine NEGATIVE  NEGATIVE   Ketones, ur NEGATIVE  NEGATIVE mg/dL   Protein, ur NEGATIVE  NEGATIVE mg/dL   Urobilinogen, UA 1.0  0.0 - 1.0 mg/dL   Nitrite NEGATIVE  NEGATIVE   Leukocytes, UA NEGATIVE  NEGATIVE  POCT PREGNANCY, URINE     Status: None   Collection Time    04/11/14  1:45 AM      Result Value Ref Range   Preg Test, Ur NEGATIVE  NEGATIVE    Assessment and Plan   1. Encounter for pregnancy test with  result negative    Patient has a plan to go to [facility] for depo injection Return to MAU as needed  Follow-up Information   Schedule an appointment as soon as possible for a visit with [facility].   Contact information:   [address] [place] [address]        Tawnya Crook 04/11/2014, 3:42 AM

## 2014-04-11 NOTE — MAU Note (Signed)
NOT IN LOBBY 

## 2014-04-11 NOTE — MAU Note (Signed)
PT SAYS SHE STARTED HAVING LOWER ABD PAIN - 8-28. BIRTH CONTROL-  DEPO- LAST  SHOT 12-27-2013.   LAST SEX-  Friday.     NO HPT.    WAS HERE LAST YEAR FOR ABD   PAIN- -  WAS A CYST.    LAST APPOINTMENT AND DELIVERED IN [place].        NO GYN DR HERE.

## 2014-04-11 NOTE — Discharge Instructions (Signed)

## 2014-04-12 NOTE — MAU Provider Note (Signed)
Attestation of Attending Supervision of Advanced Practitioner (PA/CNM/NP): Evaluation and management procedures were performed by the Advanced Practitioner under my supervision and collaboration.  I have reviewed the Advanced Practitioner's note and chart, and I agree with the management and plan.  Aarik Blank, DO Attending Physician Faculty Practice, [facility]  

## 2014-06-04 ENCOUNTER — Encounter (HOSPITAL_COMMUNITY): Payer: Self-pay | Admitting: *Deleted

## 2014-07-21 ENCOUNTER — Encounter (HOSPITAL_COMMUNITY): Payer: Self-pay

## 2014-07-21 ENCOUNTER — Emergency Department (HOSPITAL_COMMUNITY)
Admission: EM | Admit: 2014-07-21 | Discharge: 2014-07-22 | Disposition: A | Discharge status: Home / Self Care | Payer: Medicaid Other | Attending: Emergency Medicine | Admitting: Emergency Medicine

## 2014-07-21 DIAGNOSIS — H1089 Other conjunctivitis: Secondary | ICD-10-CM | POA: Diagnosis not present

## 2014-07-21 DIAGNOSIS — Z880 Allergy status to penicillin: Secondary | ICD-10-CM | POA: Diagnosis not present

## 2014-07-21 DIAGNOSIS — R011 Cardiac murmur, unspecified: Secondary | ICD-10-CM | POA: Insufficient documentation

## 2014-07-21 DIAGNOSIS — H109 Unspecified conjunctivitis: Secondary | ICD-10-CM

## 2014-07-21 DIAGNOSIS — Z792 Long term (current) use of antibiotics: Secondary | ICD-10-CM | POA: Insufficient documentation

## 2014-07-21 DIAGNOSIS — H578 Other specified disorders of eye and adnexa: Secondary | ICD-10-CM | POA: Diagnosis present

## 2014-07-21 NOTE — ED Notes (Signed)
Pt reports eye swelling, pain and redness that began this morning.  Pt reports she has had cold symptoms x1 week.

## 2014-07-21 NOTE — ED Provider Notes (Signed)
CSN: [id number]     Arrival date & time 07/21/14  2321 History   This chart was scribed for non-physician practitioner working with Dione Boozeavid Glick, MD by Richarda Overlieichard Holland, ED Scribe. This patient was seen in room TR04C/TR04C and the patient's care was started at 11:59 PM.    Chief Complaint  Patient presents with  . Eye Problem   HPI HPI Comments: Brandy [name] is a 18 y.o. female who presents to the Emergency Department complaining of right eye swelling that began this morning. Pt reports she has had cold symptoms for the last week. She states she has had chills and has blurry vision out of her right eye and is experiencing pain and redness. Pt reports she has used ibuprofen and theraflu and neither provided relief to her symptoms. Pt states she does not wear contacts. She denies any known sick contacts. Pt reports no pertinent past medical history at this time. She reports she is allergic to penicillins. She reports no modifying factors currently.  Past Medical History  Diagnosis Date  . Heart murmur    History reviewed. No pertinent past surgical history. Family History  Problem Relation Age of Onset  . Asthma Mother    History  Substance Use Topics  . Smoking status: Never Smoker   . Smokeless tobacco: Never Used  . Alcohol Use: No   OB History    Gravida Para Term Preterm AB TAB SAB Ectopic Multiple Living   1 1 1       1      Review of Systems  HENT: Positive for congestion and rhinorrhea.   Eyes: Positive for discharge, redness and itching.  All other systems reviewed and are negative.     Allergies  Penicillins  Home Medications   Prior to Admission medications   Medication Sig Start Date End Date Taking? Authorizing Provider  metroNIDAZOLE (FLAGYL) 500 MG tablet Take 1 tablet (500 mg total) by mouth 2 (two) times daily. 05/23/13   Iona HansenJennifer Irene Rasch, NP  ondansetron (ZOFRAN) 4 MG tablet Take 1 tablet (4 mg total) by mouth every 8 (eight) hours as needed for  nausea. 05/23/13   Debbrah AlarJennifer Irene Rasch, NP  oxyCODONE-acetaminophen (PERCOCET/ROXICET) 5-325 MG per tablet Take 1 tablet by mouth every 4 (four) hours as needed for pain. 05/23/13   Debbrah AlarJennifer Irene Rasch, NP  promethazine (PHENERGAN) 25 MG tablet Take 1 tablet (25 mg total) by mouth every 6 (six) hours as needed for nausea. 05/23/13   Debbrah AlarJennifer Irene Rasch, NP  trimethoprim-polymyxin b (POLYTRIM) ophthalmic solution Place 1 drop into the right eye every 6 (six) hours. 07/22/14   Betzaida Cremeens L Korry Dalgleish, PA-C   BP 104/60 mmHg  Pulse 68  Temp(Src) 98.5 F (36.9 C) (Oral)  Resp 18  Ht 5\' 4"  (1.626 m)  Wt 118 lb (53.524 kg)  BMI 20.24 kg/m2  SpO2 99%  LMP 07/21/2014 Physical Exam  Constitutional: She is oriented to person, place, and time. She appears well-developed and well-nourished. No distress.  HENT:  Head: Normocephalic and atraumatic.  Right Ear: External ear normal.  Left Ear: External ear normal.  Nose: Nose normal.  Mouth/Throat: Oropharynx is clear and moist.  Eyes: EOM are normal. Pupils are equal, round, and reactive to light. Right eye exhibits discharge (dried purulent drainage). Right conjunctiva is injected. Right conjunctiva has no hemorrhage.  Neck: Normal range of motion. Neck supple.  Cardiovascular: Normal rate, regular rhythm and normal heart sounds.   Pulmonary/Chest: Effort normal and breath sounds normal. No respiratory  distress.  Abdominal: Soft.  Musculoskeletal: Normal range of motion.  Lymphadenopathy:    She has no cervical adenopathy.  Neurological: She is alert and oriented to person, place, and time.  Skin: Skin is warm and dry. She is not diaphoretic.  Psychiatric: She has a normal mood and affect.  Nursing note and vitals reviewed.   ED Course  Procedures   DIAGNOSTIC STUDIES: Oxygen Saturation is 99% on RA, normal by my interpretation.    COORDINATION OF CARE: 1:05 AM Discussed treatment plan with pt at bedside and pt agreed to plan.   Labs  Review Labs Reviewed - No data to display  Imaging Review No results found.   EKG Interpretation None      MDM   Final diagnoses:  Bacterial conjunctivitis of right eye   Filed Vitals:   07/21/14 2340  BP: 104/60  Pulse: 68  Temp: 98.5 F (36.9 C)  Resp: 18   Afebrile, NAD, non-toxic appearing, AAOx4.    Patient presentation consistent with bacterial conjunctivitis.  No entrapment, consensual photophobia.  Presentation non-concerning for iritis, corneal abrasions, or HSV.  No antibiotics are indicated and patient will be prescribed polytrim.  Personal hygiene and frequent handwashing discussed.  Patient advised to followup with ophthalmologist if symptoms persist or worsen in any way including vision change.  Patient verbalizes understanding and is agreeable with discharge.    I personally performed the services described in this documentation, which was scribed in my presence. The recorded information has been reviewed and is accurate.       Jeannetta EllisJennifer L Jeronica Stlouis, PA-C 07/22/14 0106  Dione Boozeavid Glick, MD 07/22/14 [phone]

## 2014-07-22 MED ORDER — POLYMYXIN B-TRIMETHOPRIM 10000-0.1 UNIT/ML-% OP SOLN: 1.0000 [drp] | Freq: Four times a day (QID) | OPHTHALMIC | Status: DC

## 2014-07-22 NOTE — Discharge Instructions (Signed)
Please follow up with your primary care physician in 1-2 days. If you do not have one please call the [facility] number listed above. Please take your antibiotic until completion. Please read all discharge instructions and return precautions.    Bacterial Conjunctivitis Bacterial conjunctivitis, commonly called pink eye, is an inflammation of the clear membrane that covers the white part of the eye (conjunctiva). The inflammation can also happen on the underside of the eyelids. The blood vessels in the conjunctiva become inflamed, causing the eye to become red or pink. Bacterial conjunctivitis may spread easily from one eye to another and from person to person (contagious).  CAUSES  Bacterial conjunctivitis is caused by bacteria. The bacteria may come from your own skin, your upper respiratory tract, or from someone else with bacterial conjunctivitis. SYMPTOMS  The normally white color of the eye or the underside of the eyelid is usually pink or red. The pink eye is usually associated with irritation, tearing, and some sensitivity to light. Bacterial conjunctivitis is often associated with a thick, yellowish discharge from the eye. The discharge may turn into a crust on the eyelids overnight, which causes your eyelids to stick together. If a discharge is present, there may also be some blurred vision in the affected eye. DIAGNOSIS  Bacterial conjunctivitis is diagnosed by your caregiver through an eye exam and the symptoms that you report. Your caregiver looks for changes in the surface tissues of your eyes, which may point to the specific type of conjunctivitis. A sample of any discharge may be collected on a cotton-tip swab if you have a severe case of conjunctivitis, if your cornea is affected, or if you keep getting repeat infections that do not respond to treatment. The sample will be sent to a lab to see if the inflammation is caused by a bacterial infection and to see if the  infection will respond to antibiotic medicines. TREATMENT   Bacterial conjunctivitis is treated with antibiotics. Antibiotic eyedrops are most often used. However, antibiotic ointments are also available. Antibiotics pills are sometimes used. Artificial tears or eye washes may ease discomfort. HOME CARE INSTRUCTIONS   To ease discomfort, apply a cool, clean washcloth to your eye for 10-20 minutes, 3-4 times a day.  Gently wipe away any drainage from your eye with a warm, wet washcloth or a cotton ball.  Wash your hands often with soap and water. Use paper towels to dry your hands.  Do not share towels or washcloths. This may spread the infection.  Change or wash your pillowcase every day.  You should not use eye makeup until the infection is gone.  Do not operate machinery or drive if your vision is blurred.  Stop using contact lenses. Ask your caregiver how to sterilize or replace your contacts before using them again. This depends on the type of contact lenses that you use.  When applying medicine to the infected eye, do not touch the edge of your eyelid with the eyedrop bottle or ointment tube. SEEK IMMEDIATE MEDICAL CARE IF:   Your infection has not improved within 3 days after beginning treatment.  You had yellow discharge from your eye and it returns.  You have increased eye pain.  Your eye redness is spreading.  Your vision becomes blurred.  You have a fever or persistent symptoms for more than 2-3 days.  You have a fever and your symptoms suddenly get worse.  You have facial pain, redness, or swelling. MAKE SURE YOU:  Understand these instructions.  Will watch your condition.  Will get help right away if you are not doing well or get worse. Document Released: 07/20/2005 Document Revised: 12/04/2013 Document Reviewed: 12/21/2011 [facility] Patient Information 2015 [place], [place]. This information is not intended to replace advice given to you by your health care  provider. Make sure you discuss any questions you have with your health care provider.

## 2014-08-03 NOTE — L&D Delivery Note (Cosign Needed)
Delivery Notet  After a 10 minute 2nsd stage, at 9:52 PM a viable female was delivered via Vaginal, Spontaneous Delivery (Presentation: ; LOP).  APGAR: 9/9, ; weight  Pending.  After 3 minutes, the cord was clamped and cut. 40 units of pitocin diluted in 1000cc LR was infused rapidly IV.  The placenta separated spontaneously and delivered via CCT and maternal pushing effort.  It was inspected and appears to be intact with a 3 VC.   Anesthesia:  epidural Episiotomy:  none Lacerations:  none Suture Repair: n/a Est. Blood Loss (mL):  150  Mom to postpartum.  Baby to Couplet care / Skin to Skin.  [name],Brandy Urbas 07/04/2015, 10:08 PM

## 2014-09-23 ENCOUNTER — Encounter (HOSPITAL_COMMUNITY): Payer: Self-pay | Admitting: *Deleted

## 2014-09-23 ENCOUNTER — Emergency Department (HOSPITAL_COMMUNITY)
Admission: EM | Admit: 2014-09-23 | Discharge: 2014-09-24 | Disposition: A | Discharge status: Home / Self Care | Payer: Medicaid Other | Attending: Emergency Medicine | Admitting: Emergency Medicine

## 2014-09-23 DIAGNOSIS — Z792 Long term (current) use of antibiotics: Secondary | ICD-10-CM | POA: Insufficient documentation

## 2014-09-23 DIAGNOSIS — R112 Nausea with vomiting, unspecified: Secondary | ICD-10-CM | POA: Diagnosis not present

## 2014-09-23 DIAGNOSIS — Z79899 Other long term (current) drug therapy: Secondary | ICD-10-CM | POA: Insufficient documentation

## 2014-09-23 DIAGNOSIS — B349 Viral infection, unspecified: Secondary | ICD-10-CM

## 2014-09-23 DIAGNOSIS — Z3202 Encounter for pregnancy test, result negative: Secondary | ICD-10-CM | POA: Diagnosis not present

## 2014-09-23 DIAGNOSIS — Z880 Allergy status to penicillin: Secondary | ICD-10-CM | POA: Insufficient documentation

## 2014-09-23 DIAGNOSIS — R1084 Generalized abdominal pain: Secondary | ICD-10-CM | POA: Insufficient documentation

## 2014-09-23 DIAGNOSIS — R011 Cardiac murmur, unspecified: Secondary | ICD-10-CM | POA: Diagnosis not present

## 2014-09-23 NOTE — ED Notes (Signed)
The pt has had abd pain since yesterday with n v  lmp feb 19

## 2014-09-23 NOTE — ED Provider Notes (Signed)
CSN: [id number]     Arrival date & time 09/23/14  2343 History   First MD Initiated Contact with Patient 09/23/14 2349     Chief Complaint  Patient presents with  . Abdominal Pain     (Consider location/radiation/quality/duration/timing/severity/associated sxs/prior Treatment) HPI Comments: Patient is a 19 year old female with no past medical history who presents with abdominal pain that started yesterday. The pain is located in her generalized abdomen and does not radiate. The pain is described as cramping and severe. The pain started gradually and progressively worsened since the onset. No alleviating/aggravating factors. The patient has tried nothing for symptoms without relief. Associated symptoms include nausea and vomiting. Patient denies fever, headache, diarrhea, chest pain, SOB, dysuria, constipation, abnormal vaginal bleeding/discharge. No history of abdominal surgery. LMP 09/21/2014     Past Medical History  Diagnosis Date  . Heart murmur    History reviewed. No pertinent past surgical history. Family History  Problem Relation Age of Onset  . Asthma Mother    History  Substance Use Topics  . Smoking status: Never Smoker   . Smokeless tobacco: Never Used  . Alcohol Use: No   OB History    Gravida Para Term Preterm AB TAB SAB Ectopic Multiple Living   Review of Systems  Constitutional: Negative for fever, chills and fatigue.  HENT: Negative for trouble swallowing.   Eyes: Negative for visual disturbance.  Respiratory: Negative for shortness of breath.   Cardiovascular: Negative for chest pain and palpitations.  Gastrointestinal: Positive for nausea, vomiting and abdominal pain. Negative for diarrhea.  Genitourinary: Negative for dysuria and difficulty urinating.  Musculoskeletal: Negative for arthralgias and neck pain.  Skin: Negative for color change.  Neurological: Negative for dizziness and weakness.  Psychiatric/Behavioral: Negative for  dysphoric mood.      Allergies  Penicillins  Home Medications   Prior to Admission medications   Medication Sig Start Date End Date Taking? Authorizing Provider  metroNIDAZOLE (FLAGYL) 500 MG tablet Take 1 tablet (500 mg total) by mouth 2 (two) times daily. 05/23/13   Iona Hansen Rasch, NP  ondansetron (ZOFRAN) 4 MG tablet Take 1 tablet (4 mg total) by mouth every 8 (eight) hours as needed for nausea. 05/23/13   Debbrah Alar, NP  oxyCODONE-acetaminophen (PERCOCET/ROXICET) 5-325 MG per tablet Take 1 tablet by mouth every 4 (four) hours as needed for pain. 05/23/13   Debbrah Alar, NP  promethazine (PHENERGAN) 25 MG tablet Take 1 tablet (25 mg total) by mouth every 6 (six) hours as needed for nausea. 05/23/13   Debbrah Alar, NP  trimethoprim-polymyxin b (POLYTRIM) ophthalmic solution Place 1 drop into the right eye every 6 (six) hours. 07/22/14   Jennifer L Piepenbrink, PA-C   BP 116/74 mmHg  Pulse 94  Temp(Src) 98.9 F (37.2 C) (Oral)  Resp 16  Ht  (1.626 m)  Wt 118 lb (53.524 kg)  BMI 20.24 kg/m2  SpO2 99%  LMP 09/21/2014 Physical Exam  Constitutional: She is oriented to person, place, and time. She appears well-developed and well-nourished. No distress.  HENT:  Head: Normocephalic and atraumatic.  Eyes: Conjunctivae and EOM are normal.  Neck: Normal range of motion.  Cardiovascular: Normal rate and regular rhythm.  Exam reveals no gallop and no friction rub.   No murmur heard. Pulmonary/Chest: Effort normal and breath sounds normal. She has no wheezes. She has no rales. She exhibits no tenderness.  Abdominal: Soft. She exhibits no distension. There is tenderness. There is no rebound.  Generalized tenderness to palpation. No focal tenderness or peritoneal signs.   Musculoskeletal: Normal range of motion.  Neurological: She is alert and oriented to person, place, and time. Coordination normal.  Speech is goal-oriented. Moves limbs without ataxia.    Skin: Skin is warm and dry.  Psychiatric: She has a normal mood and affect. Her behavior is normal.  Nursing note and vitals reviewed.   ED Course  Procedures (including critical care time) Labs Review Labs Reviewed  CBC WITH DIFFERENTIAL/PLATELET - Abnormal; Notable for the following:    WBC 3.2 (*)    All other components within normal limits  URINALYSIS, ROUTINE W REFLEX MICROSCOPIC - Abnormal; Notable for the following:    Ketones, ur 15 (*)    All other components within normal limits  COMPREHENSIVE METABOLIC PANEL  LIPASE, BLOOD  PREGNANCY, URINE    Imaging Review No results found.   EKG Interpretation None      MDM   Final diagnoses:  Viral illness  Generalized abdominal pain    11:56 PM Labs and urinalysis pending. Vitals stable and patient afebrile. Patient will have fluids, morphine and zofran.   3:35 AM Labs and urinalysis unremarkable for acute changes. Patient likely has a viral illness and will be discharged with symptomatic treatment.    Brandy BeckKaitlyn Josede Cicero, PA-C 09/24/14 0341  Brandy Gaskinsonald W Wickline, MD 09/24/14 [phone]

## 2014-09-24 LAB — CBC WITH DIFFERENTIAL/PLATELET
BASOS PCT: 0 % (ref 0–1)
Basophils Absolute: 0 10*3/uL (ref 0.0–0.1)
Eosinophils Absolute: 0 10*3/uL (ref 0.0–0.7)
Eosinophils Relative: 1 % (ref 0–5)
HCT: 38.4 % (ref 36.0–46.0)
HEMOGLOBIN: 12.9 g/dL (ref 12.0–15.0)
Lymphocytes Relative: 29 % (ref 12–46)
Lymphs Abs: 0.9 10*3/uL (ref 0.7–4.0)
MCH: 32.4 pg (ref 26.0–34.0)
MCHC: 33.6 g/dL (ref 30.0–36.0)
MCV: 96.5 fL (ref 78.0–100.0)
MONOS PCT: 5 % (ref 3–12)
Monocytes Absolute: 0.2 10*3/uL (ref 0.1–1.0)
NEUTROS ABS: 2.1 10*3/uL (ref 1.7–7.7)
Neutrophils Relative %: 65 % (ref 43–77)
Platelets: 231 10*3/uL (ref 150–400)
RBC: 3.98 MIL/uL (ref 3.87–5.11)
RDW: 12.1 % (ref 11.5–15.5)
WBC: 3.2 10*3/uL — AB (ref 4.0–10.5)

## 2014-09-24 LAB — COMPREHENSIVE METABOLIC PANEL
ALT: 14 U/L (ref 0–35)
AST: 18 U/L (ref 0–37)
Albumin: 4 g/dL (ref 3.5–5.2)
Alkaline Phosphatase: 71 U/L (ref 39–117)
Anion gap: 6 (ref 5–15)
BUN: 10 mg/dL (ref 6–23)
CHLORIDE: 107 mmol/L (ref 96–112)
CO2: 24 mmol/L (ref 19–32)
CREATININE: 0.77 mg/dL (ref 0.50–1.10)
Calcium: 8.8 mg/dL (ref 8.4–10.5)
GFR calc Af Amer: >90 mL/min (ref >90)
GFR calc non Af Amer: >90 mL/min (ref >90)
GLUCOSE: 94 mg/dL (ref 70–99)
POTASSIUM: 3.5 mmol/L (ref 3.5–5.1)
SODIUM: 137 mmol/L (ref 135–145)
Total Bilirubin: 0.6 mg/dL (ref 0.3–1.2)
Total Protein: 7 g/dL (ref 6.0–8.3)

## 2014-09-24 LAB — LIPASE, BLOOD: Lipase: 29 U/L (ref 11–59)

## 2014-09-24 LAB — URINALYSIS, ROUTINE W REFLEX MICROSCOPIC
Bilirubin Urine: NEGATIVE
GLUCOSE, UA: NEGATIVE mg/dL
Hgb urine dipstick: NEGATIVE
Ketones, ur: 15 mg/dL — AB
Leukocytes, UA: NEGATIVE
Nitrite: NEGATIVE
Protein, ur: NEGATIVE mg/dL
Specific Gravity, Urine: 1.03 (ref 1.005–1.030)
Urobilinogen, UA: 1 mg/dL (ref 0.0–1.0)
pH: 7.5 (ref 5.0–8.0)

## 2014-09-24 LAB — PREGNANCY, URINE: Preg Test, Ur: NEGATIVE

## 2014-09-24 MED ORDER — ONDANSETRON 4 MG PO TBDP: 4.0000 mg | ORAL_TABLET | Freq: Three times a day (TID) | ORAL | Status: DC | PRN

## 2014-09-24 MED ORDER — MORPHINE SULFATE 4 MG/ML IJ SOLN
4.0000 mg | Freq: Once | INTRAMUSCULAR | Status: AC
  Administered 2014-09-24: 2 mg via INTRAVENOUS
  Filled 2014-09-24: qty 1

## 2014-09-24 MED ORDER — ONDANSETRON HCL 4 MG/2ML IJ SOLN
4.0000 mg | Freq: Once | INTRAMUSCULAR | Status: AC
  Administered 2014-09-24: 4 mg via INTRAVENOUS
  Filled 2014-09-24: qty 2

## 2014-09-24 MED ORDER — TRAMADOL HCL 50 MG PO TABS: 50.0000 mg | ORAL_TABLET | Freq: Four times a day (QID) | ORAL | Status: DC | PRN

## 2014-09-24 MED ORDER — ACETAMINOPHEN 500 MG PO TABS
1000.0000 mg | ORAL_TABLET | Freq: Once | ORAL | Status: AC
  Administered 2014-09-24: 1000 mg via ORAL
  Filled 2014-09-24: qty 2

## 2014-09-24 NOTE — Discharge Instructions (Signed)
Take tramadol as needed for pain. Take zofran as needed for nausea. Refer to attached documents for more information. Return to the ED with worsening or concerning symptoms.

## 2014-09-24 NOTE — ED Notes (Signed)
Szekalski, PA notified that the pt was having chest tightness.

## 2014-10-19 ENCOUNTER — Emergency Department (INDEPENDENT_AMBULATORY_CARE_PROVIDER_SITE_OTHER)
Admission: EM | Admit: 2014-10-19 | Discharge: 2014-10-19 | Disposition: A | Discharge status: Home / Self Care | Payer: Medicaid Other | Source: Home / Self Care | Attending: Emergency Medicine | Admitting: Emergency Medicine

## 2014-10-19 ENCOUNTER — Encounter (HOSPITAL_COMMUNITY): Payer: Self-pay | Admitting: *Deleted

## 2014-10-19 DIAGNOSIS — R1030 Lower abdominal pain, unspecified: Secondary | ICD-10-CM | POA: Diagnosis not present

## 2014-10-19 LAB — POCT URINALYSIS DIP (DEVICE)
Bilirubin Urine: NEGATIVE
Glucose, UA: NEGATIVE mg/dL
Nitrite: NEGATIVE
PROTEIN: NEGATIVE mg/dL
Specific Gravity, Urine: >=1.03 (ref 1.005–1.030)
Urobilinogen, UA: 1 mg/dL (ref 0.0–1.0)
pH: 6 (ref 5.0–8.0)

## 2014-10-19 LAB — POCT PREGNANCY, URINE: Preg Test, Ur: NEGATIVE

## 2014-10-19 MED ORDER — IBUPROFEN 800 MG PO TABS: 800.0000 mg | ORAL_TABLET | Freq: Three times a day (TID) | ORAL | Status: DC

## 2014-10-19 MED ORDER — KETOROLAC TROMETHAMINE 60 MG/2ML IM SOLN
INTRAMUSCULAR | Status: AC
  Filled 2014-10-19: qty 2

## 2014-10-19 MED ORDER — TRAMADOL HCL 50 MG PO TABS: 50.0000 mg | ORAL_TABLET | Freq: Four times a day (QID) | ORAL | Status: DC | PRN

## 2014-10-19 MED ORDER — KETOROLAC TROMETHAMINE 60 MG/2ML IM SOLN
60.0000 mg | Freq: Once | INTRAMUSCULAR | Status: AC
  Administered 2014-10-19: 60 mg via INTRAMUSCULAR

## 2014-10-19 MED ORDER — CIPROFLOXACIN HCL 500 MG PO TABS: 500.0000 mg | ORAL_TABLET | Freq: Two times a day (BID) | ORAL | Status: DC

## 2014-10-19 NOTE — ED Provider Notes (Signed)
CSN: [id number]     Arrival date & time 10/19/14  1134 History   First MD Initiated Contact with Patient 10/19/14 1401     Chief Complaint  Patient presents with  . Abdominal Cramping   (Consider location/radiation/quality/duration/timing/severity/associated sxs/prior Treatment) HPI She is a 19 year old woman here for evaluation of lower abdominal cramping. She states this started last night and is located across her lower abdomen. She tried some ibuprofen without improvement. Her period started today. She denies any vaginal discharge. She reports some nausea, but no vomiting. She states she has a pressure sensation when she urinates. She states this pain is similar, but worse, to pain she had previously when she had a cyst.  Past Medical History  Diagnosis Date  . Heart murmur    History reviewed. No pertinent past surgical history. Family History  Problem Relation Age of Onset  . Asthma Mother    History  Substance Use Topics  . Smoking status: Never Smoker   . Smokeless tobacco: Never Used  . Alcohol Use: No   OB History    Gravida Para Term Preterm AB TAB SAB Ectopic Multiple Living   Review of Systems  Constitutional: Negative for fever and chills.  Gastrointestinal: Positive for nausea and abdominal pain. Negative for vomiting and diarrhea.  Genitourinary: Positive for dysuria. Negative for vaginal discharge.    Allergies  Penicillins  Home Medications   Prior to Admission medications   Medication Sig Start Date End Date Taking? Authorizing Provider  ciprofloxacin (CIPRO) 500 MG tablet Take 1 tablet (500 mg total) by mouth 2 (two) times daily. 10/19/14   Charm Rings, MD  ibuprofen (ADVIL,MOTRIN) 800 MG tablet Take 1 tablet (800 mg total) by mouth 3 (three) times daily. 10/19/14   Charm Rings, MD  ondansetron (ZOFRAN ODT) 4 MG disintegrating tablet Take 1 tablet (4 mg total) by mouth every 8 (eight) hours as needed for nausea or vomiting. 09/24/14    Emilia Beck, PA-C  traMADol (ULTRAM) 50 MG tablet Take 1 tablet (50 mg total) by mouth every 6 (six) hours as needed. 10/19/14   Charm Rings, MD   BP 117/66 mmHg  Pulse 83  Temp(Src) 98.5 F (36.9 C) (Oral)  Resp 14  SpO2 100%  LMP 09/21/2014 Physical Exam  Constitutional: She is oriented to person, place, and time. She appears well-developed and well-nourished. No distress.  Neck: Neck supple.  Cardiovascular: Normal rate.   Pulmonary/Chest: Effort normal.  Abdominal: Soft. Bowel sounds are normal. She exhibits no distension and no mass. There is tenderness (across lower abdomen). There is no rebound and no guarding.  Neurological: She is alert and oriented to person, place, and time.    ED Course  Procedures (including critical care time) Labs Review Labs Reviewed  POCT URINALYSIS DIP (DEVICE) - Abnormal; Notable for the following:    Ketones, ur TRACE (*)    Hgb urine dipstick LARGE (*)    Leukocytes, UA SMALL (*)    All other components within normal limits  URINE CULTURE  POCT PREGNANCY, URINE    Imaging Review No results found.   MDM   1. Lower abdominal pain    Toradol 60 mg IM given.  Likely cyst versus UTI. We'll treat with Cipro for 3 days and send urine for culture. Ibuprofen 800 mg and tramadol 50 mg to use as needed for pain. Follow-up if symptoms change or worsen.  Charm RingsErin J Honig, MD 10/19/14 [phone]

## 2014-10-19 NOTE — ED Notes (Signed)
Pt  Reports  Low  abd  Pain     Started  Last  Pm     -          Period  Started  Today           Sme  Nausea  No  Vomiting   No  Diarrhea          Pt  States  She  Has  Had  An ovarian  Cyst  in past

## 2014-10-19 NOTE — Discharge Instructions (Signed)
You either have a cyst or a bladder infection. Take ibuprofen 800 mg 3 times a day starting at bedtime. Use the tramadol every 8 hours as needed for severe pain. Take Cipro 1 pill twice a day for 3 days. If your pain is worsening or you develop new symptoms please come back.

## 2014-10-22 LAB — URINE CULTURE: Colony Count: 85000

## 2014-11-23 ENCOUNTER — Encounter (HOSPITAL_COMMUNITY): Payer: Self-pay | Admitting: *Deleted

## 2014-11-23 ENCOUNTER — Inpatient Hospital Stay (HOSPITAL_COMMUNITY): Payer: Medicaid Other

## 2014-11-23 ENCOUNTER — Inpatient Hospital Stay (HOSPITAL_COMMUNITY)
Admission: AD | Admit: 2014-11-23 | Discharge: 2014-11-23 | Disposition: A | Discharge status: Home / Self Care | Payer: Medicaid Other | Source: Ambulatory Visit | Attending: Family Medicine | Admitting: Family Medicine

## 2014-11-23 DIAGNOSIS — Z3A01 Less than 8 weeks gestation of pregnancy: Secondary | ICD-10-CM | POA: Diagnosis not present

## 2014-11-23 DIAGNOSIS — R109 Unspecified abdominal pain: Secondary | ICD-10-CM | POA: Diagnosis not present

## 2014-11-23 DIAGNOSIS — O9989 Other specified diseases and conditions complicating pregnancy, childbirth and the puerperium: Secondary | ICD-10-CM | POA: Diagnosis not present

## 2014-11-23 DIAGNOSIS — O26899 Other specified pregnancy related conditions, unspecified trimester: Secondary | ICD-10-CM

## 2014-11-23 LAB — URINALYSIS, ROUTINE W REFLEX MICROSCOPIC
Bilirubin Urine: NEGATIVE
GLUCOSE, UA: NEGATIVE mg/dL
Hgb urine dipstick: NEGATIVE
Ketones, ur: NEGATIVE mg/dL
Nitrite: NEGATIVE
PROTEIN: NEGATIVE mg/dL
Specific Gravity, Urine: 1.01 (ref 1.005–1.030)
UROBILINOGEN UA: 0.2 mg/dL (ref 0.0–1.0)
pH: 6 (ref 5.0–8.0)

## 2014-11-23 LAB — CBC
HCT: 36.4 % (ref 36.0–46.0)
HEMOGLOBIN: 12.7 g/dL (ref 12.0–15.0)
MCH: 34 pg (ref 26.0–34.0)
MCHC: 34.9 g/dL (ref 30.0–36.0)
MCV: 97.3 fL (ref 78.0–100.0)
PLATELETS: 310 10*3/uL (ref 150–400)
RBC: 3.74 MIL/uL — AB (ref 3.87–5.11)
RDW: 12.2 % (ref 11.5–15.5)
WBC: 4.5 10*3/uL (ref 4.0–10.5)

## 2014-11-23 LAB — URINE MICROSCOPIC-ADD ON

## 2014-11-23 LAB — HCG, QUANTITATIVE, PREGNANCY: HCG, BETA CHAIN, QUANT, S: 7022 m[IU]/mL — AB (ref <5)

## 2014-11-23 LAB — WET PREP, GENITAL
TRICH WET PREP: NONE SEEN
WBC, Wet Prep HPF POC: NONE SEEN
YEAST WET PREP: NONE SEEN

## 2014-11-23 LAB — POCT PREGNANCY, URINE: PREG TEST UR: POSITIVE — AB

## 2014-11-23 NOTE — Discharge Instructions (Signed)

## 2014-11-23 NOTE — MAU Note (Signed)
Pt presents to MAU with complaints of pain in her lower abdomen. LMP was 10/11/14

## 2014-11-23 NOTE — MAU Provider Note (Signed)
Chief Complaint: Abdominal Pain and Possible Pregnancy   First Provider Initiated Contact with Patient 11/23/14 2113      SUBJECTIVE HPI: Brandy [name] is a 19 y.o. G2P1001 at [redacted]w[redacted]d by LMP who presents to maternity admissions reporting abdominal pain described as cramping and mild, bilaterally in lower abdomen, with onset unsure but at least 2 weeks ago.  She has not tried anything for pain.  The pain is not affected by position change or walking.  She denies vaginal bleeding, vaginal itching/burning, urinary symptoms, h/a, dizziness, n/v, or fever/chills.    Past Medical History  Diagnosis Date  . Heart murmur    History reviewed. No pertinent past surgical history. History   Social History  . Marital Status: Single    Spouse Name: N/A  . Number of Children: N/A  . Years of Education: N/A   Occupational History  . Not on file.   Social History Main Topics  . Smoking status: Never Smoker   . Smokeless tobacco: Never Used  . Alcohol Use: No  . Drug Use: No  . Sexual Activity: Yes    Birth Control/ Protection: None   Other Topics Concern  . Not on file   Social History Narrative   No current facility-administered medications on file prior to encounter.   Current Outpatient Prescriptions on File Prior to Encounter  Medication Sig Dispense Refill  . ciprofloxacin (CIPRO) 500 MG tablet Take 1 tablet (500 mg total) by mouth 2 (two) times daily. (Patient not taking: Reported on 11/23/2014) 6 tablet 0  . ibuprofen (ADVIL,MOTRIN) 800 MG tablet Take 1 tablet (800 mg total) by mouth 3 (three) times daily. (Patient not taking: Reported on 11/23/2014) 30 tablet 0  . ondansetron (ZOFRAN ODT) 4 MG disintegrating tablet Take 1 tablet (4 mg total) by mouth every 8 (eight) hours as needed for nausea or vomiting. (Patient not taking: Reported on 11/23/2014) 10 tablet 0  . traMADol (ULTRAM) 50 MG tablet Take 1 tablet (50 mg total) by mouth every 6 (six) hours as needed. (Patient not taking:  Reported on 11/23/2014) 15 tablet 0  . [DISCONTINUED] promethazine (PHENERGAN) 25 MG tablet Take 1 tablet (25 mg total) by mouth every 6 (six) hours as needed for nausea. (Patient not taking: Reported on 09/24/2014) 20 tablet 0   Allergies  Allergen Reactions  . Kiwi Extract Swelling    Causes swelling of the mouth and throat.  Marland Kitchen Penicillins Hives    Review of Systems  Constitutional: Negative for fever, chills and malaise/fatigue.  Eyes: Negative for blurred vision.  Respiratory: Negative for cough and shortness of breath.   Cardiovascular: Negative for chest pain.  Gastrointestinal: Positive for abdominal pain. Negative for heartburn, nausea and vomiting.  Genitourinary: Negative for dysuria, urgency and frequency.  Musculoskeletal: Negative.   Neurological: Negative for dizziness and headaches.  Psychiatric/Behavioral: Negative for depression.   OBJECTIVE Blood pressure 106/70, pulse 107, temperature 98.1 F (36.7 C), resp. rate 16, last menstrual period 10/11/2014, unknown if currently breastfeeding. GENERAL: Well-developed, well-nourished female in no acute distress.  HEENT: Normocephalic HEART: normal rate RESP: normal effort ABDOMEN: Soft, non-tender EXTREMITIES: Nontender, no edema NEURO: Alert and oriented Pelvic exam: Cervix pink, visually closed, without lesion, scant white creamy discharge, vaginal walls and external genitalia normal Bimanual exam: Cervix 0/long/high, firm, anterior, neg CMT, uterus nontender, nonenlarged, adnexa without tenderness, enlargement, or mass  LAB RESULTS Results for orders placed or performed during the hospital encounter of 11/23/14 (from the past 24 hour(s))  Urinalysis, Routine w  reflex microscopic     Status: Abnormal   Collection Time: 11/23/14  6:37 PM  Result Value Ref Range   Color, Urine YELLOW YELLOW   APPearance CLEAR CLEAR   Specific Gravity, Urine 1.010 1.005 - 1.030   pH 6.0 5.0 - 8.0   Glucose, UA NEGATIVE NEGATIVE mg/dL    Hgb urine dipstick NEGATIVE NEGATIVE   Bilirubin Urine NEGATIVE NEGATIVE   Ketones, ur NEGATIVE NEGATIVE mg/dL   Protein, ur NEGATIVE NEGATIVE mg/dL   Urobilinogen, UA 0.2 0.0 - 1.0 mg/dL   Nitrite NEGATIVE NEGATIVE   Leukocytes, UA TRACE (A) NEGATIVE  Urine microscopic-add on     Status: Abnormal   Collection Time: 11/23/14  6:37 PM  Result Value Ref Range   Squamous Epithelial / LPF MANY (A) RARE   WBC, UA 0-2 <3 WBC/hpf   RBC / HPF 0-2 <3 RBC/hpf   Bacteria, UA FEW (A) RARE  Pregnancy, urine POC     Status: Abnormal   Collection Time: 11/23/14  6:43 PM  Result Value Ref Range   Preg Test, Ur POSITIVE (A) NEGATIVE  CBC     Status: Abnormal   Collection Time: 11/23/14  8:10 PM  Result Value Ref Range   WBC 4.5 4.0 - 10.5 K/uL   RBC 3.74 (L) 3.87 - 5.11 MIL/uL   Hemoglobin 12.7 12.0 - 15.0 g/dL   HCT 19.136.4 47.836.0 - 29.546.0 %   MCV 97.3 78.0 - 100.0 fL   MCH 34.0 26.0 - 34.0 pg   MCHC 34.9 30.0 - 36.0 g/dL   RDW 62.112.2 30.811.5 - 65.715.5 %   Platelets 310 150 - 400 K/uL  hCG, quantitative, pregnancy     Status: Abnormal   Collection Time: 11/23/14  8:10 PM  Result Value Ref Range   hCG, Beta Chain, Quant, S 7022 (H) <5 mIU/mL  Wet prep, genital     Status: Abnormal   Collection Time: 11/23/14  9:20 PM  Result Value Ref Range   Yeast Wet Prep HPF POC NONE SEEN NONE SEEN   Trich, Wet Prep NONE SEEN NONE SEEN   Clue Cells Wet Prep HPF POC FEW (A) NONE SEEN   WBC, Wet Prep HPF POC NONE SEEN NONE SEEN    IMAGING [place] Ob Comp Less 14 Wks  11/23/2014   CLINICAL DATA:  Pelvic pain, positive pregnancy test  EXAM: OBSTETRIC <14 WK US AND TRANSVAGINAL OB US  TECHNIQUE: Both transabdominal and transvaginal ultrasound examinations were performed for complete evaluation of the gestation as well as the maternal uterus, adnexal regions, and pelvic cul-de-sac. Transvaginal technique was performed to assess early pregnancy.  COMPARISON:  None for this pregnancy  FINDINGS: Intrauterine gestational sac:  Visualized/normal in shape.  Yolk sac:  Visualized  Embryo:  Not visualized  Cardiac Activity: Not visualized  MSD: 7  mm   5 w   2  d  Maternal uterus/adnexae: Trace free fluid in the cul-de-sac. Ovaries appear normal.  IMPRESSION: Intrauterine gestational sac and yolk sac identified but no fetal pole or cardiac activity yet visualized. Recommend follow-up ultrasound in 10-14 days to document appropriate pregnancy progression and for the purposes of accurate dating.   Electronically Signed   By: Christiana PellantGretchen  Green M.D.   On: 11/23/2014 22:43   [place] Ob Transvaginal  11/23/2014   CLINICAL DATA:  Pelvic pain, positive pregnancy test  EXAM: OBSTETRIC <14 WK US AND TRANSVAGINAL OB US  TECHNIQUE: Both transabdominal and transvaginal ultrasound examinations were performed for complete evaluation of  the gestation as well as the maternal uterus, adnexal regions, and pelvic cul-de-sac. Transvaginal technique was performed to assess early pregnancy.  COMPARISON:  None for this pregnancy  FINDINGS: Intrauterine gestational sac: Visualized/normal in shape.  Yolk sac:  Visualized  Embryo:  Not visualized  Cardiac Activity: Not visualized  MSD: 7  mm   5 w   2  d  Maternal uterus/adnexae: Trace free fluid in the cul-de-sac. Ovaries appear normal.  IMPRESSION: Intrauterine gestational sac and yolk sac identified but no fetal pole or cardiac activity yet visualized. Recommend follow-up ultrasound in 10-14 days to document appropriate pregnancy progression and for the purposes of accurate dating.   Electronically Signed   By: Christiana Pellant M.D.   On: 11/23/2014 22:43    ASSESSMENT 1. Abdominal pain in pregnancy     PLAN Discharge home Outpatient U/S ordered in 10 days Return to MAU as needed for emergencies    Medication List    STOP taking these medications        ciprofloxacin 500 MG tablet  Commonly known as:  CIPRO     ibuprofen 800 MG tablet  Commonly known as:  ADVIL,MOTRIN     ondansetron 4 MG  disintegrating tablet  Commonly known as:  ZOFRAN ODT     traMADol 50 MG tablet  Commonly known as:  ULTRAM       Follow-up Information    Follow up with [facility] [place] [facility].   Specialty:  Radiology   Why:  Ultrasound will call you with appointment.   Contact information:   [address] [id number] mc [place] [address] [phone]      Follow up with [facility] [place] MATERNITY ADMISSIONS.   Why:  As needed for emergencies   Contact information:   [address] [id number] mc [place] [address] [phone]      Sharen Counter Certified Nurse-Midwife 11/23/2014  11:17 PM

## 2014-11-24 LAB — HIV ANTIBODY (ROUTINE TESTING W REFLEX): HIV Screen 4th Generation wRfx: NONREACTIVE

## 2014-11-26 LAB — GC/CHLAMYDIA PROBE AMP (~~LOC~~) NOT AT ARMC
Chlamydia: NEGATIVE
Neisseria Gonorrhea: NEGATIVE

## 2014-12-14 ENCOUNTER — Inpatient Hospital Stay (HOSPITAL_COMMUNITY)
Admission: AD | Admit: 2014-12-14 | Discharge: 2014-12-14 | Disposition: A | Discharge status: Home / Self Care | Payer: Medicaid Other | Source: Ambulatory Visit | Attending: Obstetrics & Gynecology | Admitting: Obstetrics & Gynecology

## 2014-12-14 ENCOUNTER — Ambulatory Visit (HOSPITAL_COMMUNITY)
Admission: RE | Admit: 2014-12-14 | Discharge: 2014-12-14 | Disposition: A | Discharge status: Home / Self Care | Payer: Medicaid Other | Source: Ambulatory Visit | Attending: Obstetrics and Gynecology | Admitting: Obstetrics and Gynecology

## 2014-12-14 DIAGNOSIS — O208 Other hemorrhage in early pregnancy: Secondary | ICD-10-CM | POA: Insufficient documentation

## 2014-12-14 DIAGNOSIS — Z3A08 8 weeks gestation of pregnancy: Secondary | ICD-10-CM | POA: Insufficient documentation

## 2014-12-14 DIAGNOSIS — Z3481 Encounter for supervision of other normal pregnancy, first trimester: Secondary | ICD-10-CM

## 2014-12-14 DIAGNOSIS — R109 Unspecified abdominal pain: Secondary | ICD-10-CM

## 2014-12-14 DIAGNOSIS — O26899 Other specified pregnancy related conditions, unspecified trimester: Secondary | ICD-10-CM

## 2014-12-14 NOTE — Discharge Instructions (Signed)
First Trimester of Pregnancy The first trimester of pregnancy is from week 1 until the end of week 12 (months 1 through 3). A week after a sperm fertilizes an egg, the egg will implant on the wall of the uterus. This embryo will begin to develop into a baby. Genes from you and your partner are forming the baby. The female genes determine whether the baby is a boy or a girl. At 6-8 weeks, the eyes and face are formed, and the heartbeat can be seen on ultrasound. At the end of 12 weeks, all the baby's organs are formed.  Now that you are pregnant, you will want to do everything you can to have a healthy baby. Two of the most important things are to get good prenatal care and to follow your health care provider's instructions. Prenatal care is all the medical care you receive before the baby's birth. This care will help prevent, find, and treat any problems during the pregnancy and childbirth. BODY CHANGES Your body goes through many changes during pregnancy. The changes vary from woman to woman.   You may gain or lose a couple of pounds at first.  You may feel sick to your stomach (nauseous) and throw up (vomit). If the vomiting is uncontrollable, call your health care provider.  You may tire easily.  You may develop headaches that can be relieved by medicines approved by your health care provider.  You may urinate more often. Painful urination may mean you have a bladder infection.  You may develop heartburn as a result of your pregnancy.  You may develop constipation because certain hormones are causing the muscles that push waste through your intestines to slow down.  You may develop hemorrhoids or swollen, bulging veins (varicose veins).  Your breasts may begin to grow larger and become tender. Your nipples may stick out more, and the tissue that surrounds them (areola) may become darker.  Your gums may bleed and may be sensitive to brushing and flossing.  Dark spots or blotches (chloasma,  mask of pregnancy) may develop on your face. This will likely fade after the baby is born.  Your menstrual periods will stop.  You may have a loss of appetite.  You may develop cravings for certain kinds of food.  You may have changes in your emotions from day to day, such as being excited to be pregnant or being concerned that something may go wrong with the pregnancy and baby.  You may have more vivid and strange dreams.  You may have changes in your hair. These can include thickening of your hair, rapid growth, and changes in texture. Some women also have hair loss during or after pregnancy, or hair that feels dry or thin. Your hair will most likely return to normal after your baby is born. WHAT TO EXPECT AT YOUR PRENATAL VISITS During a routine prenatal visit:  You will be weighed to make sure you and the baby are growing normally.  Your blood pressure will be taken.  Your abdomen will be measured to track your baby's growth.  The fetal heartbeat will be listened to starting around week 10 or 12 of your pregnancy.  Test results from any previous visits will be discussed. Your health care provider may ask you:  How you are feeling.  If you are feeling the baby move.  If you have had any abnormal symptoms, such as leaking fluid, bleeding, severe headaches, or abdominal cramping.  If you have any questions. Other tests   that may be performed during your first trimester include:  Blood tests to find your blood type and to check for the presence of any previous infections. They will also be used to check for low iron levels (anemia) and Rh antibodies. Later in the pregnancy, blood tests for diabetes will be done along with other tests if problems develop.  Urine tests to check for infections, diabetes, or protein in the urine.  An ultrasound to confirm the proper growth and development of the baby.  An amniocentesis to check for possible genetic problems.  Fetal screens for  spina bifida and Down syndrome.  You may need other tests to make sure you and the baby are doing well. HOME CARE INSTRUCTIONS  Medicines  Follow your health care provider's instructions regarding medicine use. Specific medicines may be either safe or unsafe to take during pregnancy.  Take your prenatal vitamins as directed.  If you develop constipation, try taking a stool softener if your health care provider approves. Diet  Eat regular, well-balanced meals. Choose a variety of foods, such as meat or vegetable-based protein, fish, milk and low-fat dairy products, vegetables, fruits, and whole grain breads and cereals. Your health care provider will help you determine the amount of weight gain that is right for you.  Avoid raw meat and uncooked cheese. These carry germs that can cause birth defects in the baby.  Eating four or five small meals rather than three large meals a day may help relieve nausea and vomiting. If you start to feel nauseous, eating a few soda crackers can be helpful. Drinking liquids between meals instead of during meals also seems to help nausea and vomiting.  If you develop constipation, eat more high-fiber foods, such as fresh vegetables or fruit and whole grains. Drink enough fluids to keep your urine clear or pale yellow. Activity and Exercise  Exercise only as directed by your health care provider. Exercising will help you:  Control your weight.  Stay in shape.  Be prepared for labor and delivery.  Experiencing pain or cramping in the lower abdomen or low back is a good sign that you should stop exercising. Check with your health care provider before continuing normal exercises.  Try to avoid standing for long periods of time. Move your legs often if you must stand in one place for a long time.  Avoid heavy lifting.  Wear low-heeled shoes, and practice good posture.  You may continue to have sex unless your health care provider directs you  otherwise. Relief of Pain or Discomfort  Wear a good support bra for breast tenderness.   Take warm sitz baths to soothe any pain or discomfort caused by hemorrhoids. Use hemorrhoid cream if your health care provider approves.   Rest with your legs elevated if you have leg cramps or low back pain.  If you develop varicose veins in your legs, wear support hose. Elevate your feet for 15 minutes, 3-4 times a day. Limit salt in your diet. Prenatal Care  Schedule your prenatal visits by the twelfth week of pregnancy. They are usually scheduled monthly at first, then more often in the last 2 months before delivery.  Write down your questions. Take them to your prenatal visits.  Keep all your prenatal visits as directed by your health care provider. Safety  Wear your seat belt at all times when driving.  Make a list of emergency phone numbers, including numbers for family, friends, the hospital, and police and fire departments. General Tips    Ask your health care provider for a referral to a local prenatal education class. Begin classes no later than at the beginning of month 6 of your pregnancy.  Ask for help if you have counseling or nutritional needs during pregnancy. Your health care provider can offer advice or refer you to specialists for help with various needs.  Do not use hot tubs, steam rooms, or saunas.  Do not douche or use tampons or scented sanitary pads.  Do not cross your legs for long periods of time.  Avoid cat litter boxes and soil used by cats. These carry germs that can cause birth defects in the baby and possibly loss of the fetus by miscarriage or stillbirth.  Avoid all smoking, herbs, alcohol, and medicines not prescribed by your health care provider. Chemicals in these affect the formation and growth of the baby.  Schedule a dentist appointment. At home, brush your teeth with a soft toothbrush and be gentle when you floss. SEEK MEDICAL CARE IF:   You have  dizziness.  You have mild pelvic cramps, pelvic pressure, or nagging pain in the abdominal area.  You have persistent nausea, vomiting, or diarrhea.  You have a bad smelling vaginal discharge.  You have pain with urination.  You notice increased swelling in your face, hands, legs, or ankles. SEEK IMMEDIATE MEDICAL CARE IF:   You have a fever.  You are leaking fluid from your vagina.  You have spotting or bleeding from your vagina.  You have severe abdominal cramping or pain.  You have rapid weight gain or loss.  You vomit blood or material that looks like coffee grounds.  You are exposed to German measles and have never had them.  You are exposed to fifth disease or chickenpox.  You develop a severe headache.  You have shortness of breath.  You have any kind of trauma, such as from a fall or a car accident. Document Released: 07/14/2001 Document Revised: 12/04/2013 Document Reviewed: 05/30/2013 ExitCare Patient Information 2015 [employer]. This information is not intended to replace advice given to you by your health care provider. Make sure you discuss any questions you have with your health care provider.  

## 2014-12-14 NOTE — MAU Provider Note (Signed)
Brandy [name] 19 y.o. G2P1001 @[redacted]w[redacted]d  presents for follow up ultrasound. Ultrasound shows positive cardiac activity HR 165 and she is  8 weeks 0 days. EDD 07/26/15. She denies pain or bleeding. She is to start her prenatal care. She is to start prenatal vitamins. She will return here as needed for any problems. Review of Systems  All other systems reviewed and are negative. Physical Exam  Constitutional: She is oriented to person, place, and time and well-developed, well-nourished, and in no distress. No distress.  HENT:  Head: Normocephalic.  Pulmonary/Chest: Effort normal. No respiratory distress.  Neurological: She is alert and oriented to person, place, and time. Gait normal.  Skin: No rash noted. No erythema. No pallor.  Psychiatric: Mood, memory, affect and judgment normal.   exam [place] Ob Comp Less 14 Wks  11/23/2014   CLINICAL DATA:  Pelvic pain, positive pregnancy test  EXAM: OBSTETRIC <14 WK US AND TRANSVAGINAL OB US  TECHNIQUE: Both transabdominal and transvaginal ultrasound examinations were performed for complete evaluation of the gestation as well as the maternal uterus, adnexal regions, and pelvic cul-de-sac. Transvaginal technique was performed to assess early pregnancy.  COMPARISON:  None for this pregnancy  FINDINGS: Intrauterine gestational sac: Visualized/normal in shape.  Yolk sac:  Visualized  Embryo:  Not visualized  Cardiac Activity: Not visualized  MSD: 7  mm   5 w   2  d  Maternal uterus/adnexae: Trace free fluid in the cul-de-sac. Ovaries appear normal.  IMPRESSION: Intrauterine gestational sac and yolk sac identified but no fetal pole or cardiac activity yet visualized. Recommend follow-up ultrasound in 10-14 days to document appropriate pregnancy progression and for the purposes of accurate dating.   Electronically Signed   By: Christiana PellantGretchen  Green M.D.   On: 11/23/2014 22:43   [place] Ob Transvaginal  12/14/2014   CLINICAL DATA:  Abdominal pain in pregnancy. Vaginal bleeding. First  trimester pregnancy with inconclusive fetal viability.  EXAM: TRANSVAGINAL OB ULTRASOUND  TECHNIQUE: Transvaginal ultrasound was performed for complete evaluation of the gestation as well as the maternal uterus, adnexal regions, and pelvic cul-de-sac.  COMPARISON:  11/23/2014  FINDINGS: Intrauterine gestational sac: Visualized/normal in shape.  Yolk sac:  Visualized  Embryo:  Visualized  Cardiac Activity: Visualized  Heart Rate: 165 bpm  CRL:   16  mm   8 w 0 d                  US EDC: 07/26/2015  Maternal uterus/adnexae: A small subchorionic hemorrhage is noted. Both ovaries are normal in appearance. No mass or free fluid identified.  IMPRESSION: Single living IUP measuring 8 weeks 0 days with US EDC of 07/26/2015. Appropriate progression since prior exam.  Small subchorionic hemorrhage noted.   Electronically Signed   By: Myles RosenthalJohn  Stahl M.D.   On: 12/14/2014 14:04   [place] Ob Transvaginal  11/23/2014   CLINICAL DATA:  Pelvic pain, positive pregnancy test  EXAM: OBSTETRIC <14 WK US AND TRANSVAGINAL OB US  TECHNIQUE: Both transabdominal and transvaginal ultrasound examinations were performed for complete evaluation of the gestation as well as the maternal uterus, adnexal regions, and pelvic cul-de-sac. Transvaginal technique was performed to assess early pregnancy.  COMPARISON:  None for this pregnancy  FINDINGS: Intrauterine gestational sac: Visualized/normal in shape.  Yolk sac:  Visualized  Embryo:  Not visualized  Cardiac Activity: Not visualized  MSD: 7  mm   5 w   2  d  Maternal uterus/adnexae: Trace free fluid in the cul-de-sac. Ovaries appear  normal.  IMPRESSION: Intrauterine gestational sac and yolk sac identified but no fetal pole or cardiac activity yet visualized. Recommend follow-up ultrasound in 10-14 days to document appropriate pregnancy progression and for the purposes of accurate dating.   Electronically Signed   By: Christiana PellantGretchen  Green M.D.   On: 11/23/2014 22:43   Illene BolusLori Bonne Whack CNM

## 2014-12-20 ENCOUNTER — Inpatient Hospital Stay (HOSPITAL_COMMUNITY)
Admission: AD | Admit: 2014-12-20 | Discharge: 2014-12-21 | Disposition: A | Discharge status: Home / Self Care | Payer: Medicaid Other | Source: Ambulatory Visit | Attending: Obstetrics & Gynecology | Admitting: Obstetrics & Gynecology

## 2014-12-20 DIAGNOSIS — O26891 Other specified pregnancy related conditions, first trimester: Secondary | ICD-10-CM

## 2014-12-20 DIAGNOSIS — O219 Vomiting of pregnancy, unspecified: Secondary | ICD-10-CM

## 2014-12-20 DIAGNOSIS — Z3A09 9 weeks gestation of pregnancy: Secondary | ICD-10-CM | POA: Insufficient documentation

## 2014-12-20 DIAGNOSIS — R51 Headache: Secondary | ICD-10-CM | POA: Insufficient documentation

## 2014-12-20 DIAGNOSIS — O210 Mild hyperemesis gravidarum: Secondary | ICD-10-CM | POA: Insufficient documentation

## 2014-12-20 DIAGNOSIS — R519 Headache, unspecified: Secondary | ICD-10-CM

## 2014-12-21 ENCOUNTER — Encounter (HOSPITAL_COMMUNITY): Payer: Self-pay | Admitting: *Deleted

## 2014-12-21 DIAGNOSIS — O219 Vomiting of pregnancy, unspecified: Secondary | ICD-10-CM | POA: Diagnosis not present

## 2014-12-21 DIAGNOSIS — R51 Headache: Secondary | ICD-10-CM | POA: Diagnosis present

## 2014-12-21 DIAGNOSIS — O210 Mild hyperemesis gravidarum: Secondary | ICD-10-CM | POA: Diagnosis not present

## 2014-12-21 DIAGNOSIS — Z3A09 9 weeks gestation of pregnancy: Secondary | ICD-10-CM | POA: Diagnosis not present

## 2014-12-21 LAB — URINALYSIS, ROUTINE W REFLEX MICROSCOPIC
Bilirubin Urine: NEGATIVE
GLUCOSE, UA: NEGATIVE mg/dL
Hgb urine dipstick: NEGATIVE
Ketones, ur: 15 mg/dL — AB
LEUKOCYTES UA: NEGATIVE
Nitrite: NEGATIVE
PH: 6 (ref 5.0–8.0)
Protein, ur: 100 mg/dL — AB
SPECIFIC GRAVITY, URINE: 1.025 (ref 1.005–1.030)
Urobilinogen, UA: 1 mg/dL (ref 0.0–1.0)

## 2014-12-21 LAB — URINE MICROSCOPIC-ADD ON

## 2014-12-21 MED ORDER — PROMETHAZINE HCL 25 MG PO TABS: 12.5000 mg | ORAL_TABLET | Freq: Four times a day (QID) | ORAL | Status: DC | PRN

## 2014-12-21 MED ORDER — PROMETHAZINE HCL 25 MG/ML IJ SOLN
25.0000 mg | Freq: Once | INTRAMUSCULAR | Status: AC
  Administered 2014-12-21: 25 mg via INTRAVENOUS
  Filled 2014-12-21: qty 1

## 2014-12-21 MED ORDER — BUTALBITAL-APAP-CAFFEINE 50-325-40 MG PO TABS: 1.0000 | ORAL_TABLET | Freq: Four times a day (QID) | ORAL | Status: DC | PRN

## 2014-12-21 MED ORDER — BUTALBITAL-APAP-CAFFEINE 50-325-40 MG PO TABS
2.0000 | ORAL_TABLET | Freq: Once | ORAL | Status: AC
  Administered 2014-12-21: 2 via ORAL
  Filled 2014-12-21: qty 2

## 2014-12-21 NOTE — Discharge Instructions (Signed)

## 2014-12-21 NOTE — MAU Note (Signed)
PT  SAYS SHE STARTED  VOMITING  ,  ABD  PAIN     AND H/A   ON Monday  .  [facility]    TOOK TYLENOL   2 REG TABS   FOR  H/A-  NO RELIEF

## 2014-12-21 NOTE — MAU Provider Note (Signed)
History     CSN: [id number]  Arrival date and time: 12/20/14 2339   First Provider Initiated Contact with Patient 12/21/14 0120      No chief complaint on file.  HPI Comments: Brandy [name] is a 19 y.o. G2P1001 at 1253w2d who presents today with headache, and nausea/vomiting. She states that the nausea and vomiting has been ongoing since finding out she was pregnant. However, the headache started about three days ago. She has tried tylenol, but it hasn't helped. She denies any VB or LOC.   Headache  This is a new problem. The current episode started in the past 7 days. The problem occurs constantly. The problem has been unchanged. The pain is located in the frontal region. The pain does not radiate. The pain quality is similar to prior headaches. The quality of the pain is described as aching. The pain is at a severity of 10/10. Associated symptoms include nausea and vomiting. Pertinent negatives include no abdominal pain or fever. Nothing aggravates the symptoms. She has tried acetaminophen for the symptoms. The treatment provided no relief.  Emesis  This is a new problem. The current episode started in the past 7 days. The problem occurs 2 to 4 times per day. The problem has been unchanged. The emesis has an appearance of stomach contents. Associated symptoms include headaches. Pertinent negatives include no abdominal pain, diarrhea or fever. Risk factors: pregnancy  She has tried nothing for the symptoms.      Past Medical History  Diagnosis Date  . Heart murmur     History reviewed. No pertinent past surgical history.  Family History  Problem Relation Age of Onset  . Asthma Mother     History  Substance Use Topics  . Smoking status: Never Smoker   . Smokeless tobacco: Never Used  . Alcohol Use: No    Allergies:  Allergies  Allergen Reactions  . Kiwi Extract Swelling    Causes swelling of the mouth and throat.  Marland Kitchen. Penicillins Hives    No prescriptions prior to  admission    Review of Systems  Constitutional: Negative for fever.  Gastrointestinal: Positive for nausea and vomiting. Negative for abdominal pain, diarrhea and constipation.  Genitourinary: Negative for dysuria, urgency and frequency.  Neurological: Positive for headaches.   Physical Exam   Blood pressure 102/69, pulse 102, temperature 100.3 F (37.9 C), temperature source Oral, resp. rate 18, height 5\' 3"  (1.6 m), weight 53.695 kg (118 lb 6 oz), last menstrual period 10/11/2014, unknown if currently breastfeeding.  Physical Exam  Nursing note and vitals reviewed. Constitutional: She is oriented to person, place, and time. She appears well-developed and well-nourished. No distress.  Cardiovascular: Normal rate.   Respiratory: Effort normal.  GI: Soft. There is no tenderness.  Neurological: She is alert and oriented to person, place, and time.  Skin: Skin is warm and dry.  Psychiatric: She has a normal mood and affect.   Results for orders placed or performed during the hospital encounter of 12/20/14 (from the past 24 hour(s))  Urinalysis, Routine w reflex microscopic     Status: Abnormal   Collection Time: 12/21/14 12:15 AM  Result Value Ref Range   Color, Urine YELLOW YELLOW   APPearance HAZY (A) CLEAR   Specific Gravity, Urine 1.025 1.005 - 1.030   pH 6.0 5.0 - 8.0   Glucose, UA NEGATIVE NEGATIVE mg/dL   Hgb urine dipstick NEGATIVE NEGATIVE   Bilirubin Urine NEGATIVE NEGATIVE   Ketones, ur 15 (A) NEGATIVE mg/dL  Protein, ur 100 (A) NEGATIVE mg/dL   Urobilinogen, UA 1.0 0.0 - 1.0 mg/dL   Nitrite NEGATIVE NEGATIVE   Leukocytes, UA NEGATIVE NEGATIVE  Urine microscopic-add on     Status: Abnormal   Collection Time: 12/21/14 12:15 AM  Result Value Ref Range   Squamous Epithelial / LPF FEW (A) RARE   WBC, UA 3-6 <3 WBC/hpf   Bacteria, UA FEW (A) RARE   Urine-Other MUCOUS PRESENT     MAU Course  Procedures  MDM Patient is tolerating PO Headache is improved    Assessment and Plan   1. Nausea and vomiting during pregnancy prior to [redacted] weeks gestation   2. Headache in pregnancy, first trimester    DC home RX: phenergan and Fioricet  [facility]  Follow-up Information    Schedule an appointment as soon as possible [facility] [place]   Contact information:   [address] [facility] [place] [phone]       Tawnya CrookHogan, Fatema Rabe Donovan 12/21/2014, 1:26 AM

## 2015-01-01 ENCOUNTER — Inpatient Hospital Stay (HOSPITAL_COMMUNITY)
Admission: AD | Admit: 2015-01-01 | Discharge: 2015-01-02 | Disposition: A | Discharge status: Home / Self Care | Payer: Medicaid Other | Source: Ambulatory Visit | Attending: Family Medicine | Admitting: Family Medicine

## 2015-01-01 DIAGNOSIS — O219 Vomiting of pregnancy, unspecified: Secondary | ICD-10-CM

## 2015-01-01 DIAGNOSIS — Z3A10 10 weeks gestation of pregnancy: Secondary | ICD-10-CM | POA: Insufficient documentation

## 2015-01-01 DIAGNOSIS — O210 Mild hyperemesis gravidarum: Secondary | ICD-10-CM | POA: Insufficient documentation

## 2015-01-02 ENCOUNTER — Encounter (HOSPITAL_COMMUNITY): Payer: Self-pay | Admitting: Anesthesiology

## 2015-01-02 ENCOUNTER — Encounter (HOSPITAL_COMMUNITY): Payer: Self-pay | Admitting: *Deleted

## 2015-01-02 DIAGNOSIS — O219 Vomiting of pregnancy, unspecified: Secondary | ICD-10-CM | POA: Diagnosis not present

## 2015-01-02 DIAGNOSIS — Z3A11 11 weeks gestation of pregnancy: Secondary | ICD-10-CM

## 2015-01-02 LAB — URINALYSIS, ROUTINE W REFLEX MICROSCOPIC
Bilirubin Urine: NEGATIVE
Glucose, UA: NEGATIVE mg/dL
Hgb urine dipstick: NEGATIVE
KETONES UR: NEGATIVE mg/dL
LEUKOCYTES UA: NEGATIVE
NITRITE: NEGATIVE
PH: 6 (ref 5.0–8.0)
PROTEIN: NEGATIVE mg/dL
Specific Gravity, Urine: >1.03 — ABNORMAL HIGH (ref 1.005–1.030)
UROBILINOGEN UA: 0.2 mg/dL (ref 0.0–1.0)

## 2015-01-02 MED ORDER — METOCLOPRAMIDE HCL 5 MG/ML IJ SOLN
10.0000 mg | Freq: Once | INTRAMUSCULAR | Status: AC
  Administered 2015-01-02: 10 mg via INTRAVENOUS
  Filled 2015-01-02: qty 2

## 2015-01-02 MED ORDER — LACTATED RINGERS IV BOLUS (SEPSIS)
1000.0000 mL | Freq: Once | INTRAVENOUS | Status: AC
  Administered 2015-01-02: 1000 mL via INTRAVENOUS

## 2015-01-02 MED ORDER — LACTATED RINGERS IV BOLUS (SEPSIS): 1000.0000 mL | Freq: Once | INTRAVENOUS | Status: DC

## 2015-01-02 MED ORDER — METOCLOPRAMIDE HCL 10 MG PO TABS: 10.0000 mg | ORAL_TABLET | Freq: Four times a day (QID) | ORAL | Status: DC

## 2015-01-02 MED ORDER — PROMETHAZINE HCL 25 MG/ML IJ SOLN
25.0000 mg | Freq: Once | INTRAVENOUS | Status: AC
  Administered 2015-01-02: 25 mg via INTRAVENOUS
  Filled 2015-01-02: qty 1

## 2015-01-02 NOTE — MAU Note (Signed)
Pt states she began having really bad pain in her lower abd at 1900, vomiting

## 2015-01-02 NOTE — Discharge Instructions (Signed)
Morning Sickness °Morning sickness is when you feel sick to your stomach (nauseous) during pregnancy. You may feel sick to your stomach and throw up (vomit). You may feel sick in the morning, but you can feel this way any time of day. Some women feel very sick to their stomach and cannot stop throwing up (hyperemesis gravidarum). °HOME CARE °· Only take medicines as told by your doctor. °· Take multivitamins as told by your doctor. Taking multivitamins before getting pregnant can stop or lessen the harshness of morning sickness. °· Eat dry toast or unsalted crackers before getting out of bed. °· Eat 5 to 6 small meals a day. °· Eat dry and bland foods like rice and baked potatoes. °· Do not drink liquids with meals. Drink between meals. °· Do not eat greasy, fatty, or spicy foods. °· Have someone cook for you if the smell of food causes you to feel sick or throw up. °· If you feel sick to your stomach after taking prenatal vitamins, take them at night or with a snack. °· Eat protein when you need a snack (nuts, yogurt, cheese). °· Eat unsweetened gelatins for dessert. °· Wear a bracelet used for sea sickness (acupressure wristband). °· Go to a doctor that puts thin needles into certain body points (acupuncture) to improve how you feel. °· Do not smoke. °· Use a humidifier to keep the air in your house free of odors. °· Get lots of fresh air. °GET HELP IF: °· You need medicine to feel better. °· You feel dizzy or lightheaded. °· You are losing weight. °GET HELP RIGHT AWAY IF:  °· You feel very sick to your stomach and cannot stop throwing up. °· You pass out (faint). °MAKE SURE YOU: °· Understand these instructions. °· Will watch your condition. °· Will get help right away if you are not doing well or get worse. °Document Released: 08/27/2004 Document Revised: 07/25/2013 Document Reviewed: 01/04/2013 °Exit[employer]® Patient Information ©2015 [employer]. This information is not intended to replace advice given to you by  your health care provider. Make sure you discuss any questions you have with your health care provider. ° °Eating Plan for Hyperemesis Gravidarum °Severe cases of hyperemesis gravidarum can lead to dehydration and malnutrition. The hyperemesis eating plan is one way to lessen the symptoms of nausea and vomiting. It is often used with prescribed medicines to control your symptoms.  °WHAT CAN I DO TO RELIEVE MY SYMPTOMS? °Listen to your body. Everyone is different and has different preferences. Find what works best for you. Some of the following things may help: °· Eat and drink slowly. °· Eat 5-6 small meals daily instead of 3 large meals.   °· Eat crackers before you get out of bed in the morning.   °· Starchy foods are usually well tolerated (such as cereal, toast, bread, potatoes, pasta, rice, and pretzels).   °· Ginger may help with nausea. Add ¼ tsp ground ginger to hot tea or choose ginger tea.   °· Try drinking 100% fruit juice or an electrolyte drink. °· Continue to take your prenatal vitamins as directed by your health care provider. If you are having trouble taking your prenatal vitamins, talk with your health care provider about different options. °· Include at least 1 serving of protein with your meals and snacks (such as meats or poultry, beans, nuts, eggs, or yogurt). Try eating a protein-rich snack before bed (such as cheese and crackers or a half turkey or peanut butter sandwich). °WHAT THINGS SHOULD I   AVOID TO REDUCE MY SYMPTOMS? °The following things may help reduce your symptoms: °· Avoid foods with strong smells. Try eating meals in well-ventilated areas that are free of odors. °· Avoid drinking water or other beverages with meals. Try not to drink anything less than 30 minutes before and after meals. °· Avoid drinking more than 1 cup of fluid at a time. °· Avoid fried or high-fat foods, such as butter and cream sauces. °· Avoid spicy foods. °· Avoid skipping meals the best you can. Nausea can be  more intense on an empty stomach. If you cannot tolerate food at that time, do not force it. Try sucking on ice chips or other frozen items and make up the calories later. °· Avoid lying down within 2 hours after eating. °Document Released: 05/17/2007 Document Revised: 07/25/2013 Document Reviewed: 05/24/2013 °ExitCare® Patient Information ©2015 [employer]. This information is not intended to replace advice given to you by your health care provider. Make sure you discuss any questions you have with your health care provider. ° °

## 2015-01-02 NOTE — MAU Provider Note (Signed)
History     CSN: [id number]  Arrival date and time: 01/01/15 2359   First Provider Initiated Contact with Patient 01/02/15 0147      No chief complaint on file.  HPI [name] is a 19 y.o. G2P1001 at [redacted]w[redacted]d who presents to MAU today with complaint of N/V and abdominal pain since last night. The patient states 2 loose stools as well yesterday. She states abdominal pain is diffuse and moderate and started with onset of N/V. She denies fever, UTI symptoms or sick contacts. She has has nausea throughout the pregnancy, but not a lot of vomiting. She has not taken any ant-emetics.   OB History    Gravida Para Term Preterm AB TAB SAB Ectopic Multiple Living   Past Medical History  Diagnosis Date  . Heart murmur     History reviewed. No pertinent past surgical history.  Family History  Problem Relation Age of Onset  . Asthma Mother     History  Substance Use Topics  . Smoking status: Never Smoker   . Smokeless tobacco: Never Used  . Alcohol Use: No    Allergies:  Allergies  Allergen Reactions  . Kiwi Extract Swelling    Causes swelling of the mouth and throat.  Marland Kitchen Penicillins Hives    Prescriptions prior to admission  Medication Sig Dispense Refill Last Dose  . butalbital-acetaminophen-caffeine (FIORICET) 50-325-40 MG per tablet Take 1-2 tablets by mouth every 6 (six) hours as needed for headache. 20 tablet 0 Past Week at Unknown time  . promethazine (PHENERGAN) 25 MG tablet Take 0.5-1 tablets (12.5-25 mg total) by mouth every 6 (six) hours as needed. 30 tablet 0 More than a month at Unknown time    Review of Systems  Constitutional: Negative for fever and malaise/fatigue.  Gastrointestinal: Positive for nausea, vomiting, abdominal pain and diarrhea. Negative for constipation.  Genitourinary: Negative for dysuria, urgency and frequency.       Neg - vaginal bleeding, discharge   Physical Exam   Blood pressure 121/72, pulse 114,  temperature 98.6 F (37 C), resp. rate 18, height  (1.626 m), weight 118 lb (53.524 kg), last menstrual period 10/11/2014, SpO2 98 %, unknown if currently breastfeeding.  Physical Exam  Nursing note and vitals reviewed. Constitutional: She is oriented to person, place, and time. She appears well-developed and well-nourished. No distress.  HENT:  Head: Normocephalic and atraumatic.  Cardiovascular: Normal rate.   Respiratory: Effort normal.  GI: Soft. Bowel sounds are normal. She exhibits no distension and no mass. There is tenderness (mild diffuse tenderness to palpation of the abdomen). There is no rebound and no guarding.  Neurological: She is alert and oriented to person, place, and time.  Skin: Skin is warm and dry. No erythema.  Psychiatric: She has a normal mood and affect.   Results for orders placed or performed during the hospital encounter of 01/01/15 (from the past 24 hour(s))  Urinalysis, Routine w reflex microscopic     Status: Abnormal   Collection Time: 01/02/15 12:23 AM  Result Value Ref Range   Color, Urine YELLOW YELLOW   APPearance CLEAR CLEAR   Specific Gravity, Urine >1.030 (H) 1.005 - 1.030   pH 6.0 5.0 - 8.0   Glucose, UA NEGATIVE NEGATIVE mg/dL   Hgb urine dipstick NEGATIVE NEGATIVE   Bilirubin Urine NEGATIVE NEGATIVE   Ketones, ur NEGATIVE NEGATIVE mg/dL   Protein, ur NEGATIVE  NEGATIVE mg/dL   Urobilinogen, UA 0.2 0.0 - 1.0 mg/dL   Nitrite NEGATIVE NEGATIVE   Leukocytes, UA NEGATIVE NEGATIVE     MAU Course  Procedures  MDM FHR - 151 bpm with doppler UA today shows mild dehydration. Patient is actively vomiting upon arrival in MAU 25 mg Phenergan infusion in 1 liter LR given  First liter completed. Entered room to re-assess patient. Had to wake her up. Patient states still having nausea. No additional emesis.  1 liter LR ordered with 10 mg Reglan.  Patient reports improvement in symptoms. No additional emesis while in MAU Note reviewed from  previous visit in MAU Assessment and Plan  A: SIUP at [id number] Nausea and vomiting in pregnancy prior to [redacted] weeks gestation  P: Discharge home Rx for Reglan sent to patient's pharmacy Diet for N/V in pregnancy included on AVS Patient advised to follow-up with [facility] to start prenatal care as planned Patient may return to MAU as needed or if her condition were to change or worsen  Marny LowensteinJulie N Shiquan Mathieu, PA-C  01/02/2015, 5:09 AM

## 2015-01-02 NOTE — MAU Note (Signed)
SAYS SHE STARTED   VOMITING  AT 8 PM . [facility]-   PLANS  FOR HD.

## 2015-01-25 ENCOUNTER — Inpatient Hospital Stay (HOSPITAL_COMMUNITY)
Admission: AD | Admit: 2015-01-25 | Discharge: 2015-01-26 | Disposition: A | Discharge status: Home / Self Care | Payer: Self-pay | Source: Ambulatory Visit | Attending: Obstetrics & Gynecology | Admitting: Obstetrics & Gynecology

## 2015-01-25 ENCOUNTER — Encounter (HOSPITAL_COMMUNITY): Payer: Self-pay | Admitting: *Deleted

## 2015-01-25 DIAGNOSIS — O26899 Other specified pregnancy related conditions, unspecified trimester: Secondary | ICD-10-CM

## 2015-01-25 DIAGNOSIS — Z3493 Encounter for supervision of normal pregnancy, unspecified, third trimester: Secondary | ICD-10-CM | POA: Insufficient documentation

## 2015-01-25 DIAGNOSIS — Z3A14 14 weeks gestation of pregnancy: Secondary | ICD-10-CM | POA: Insufficient documentation

## 2015-01-25 DIAGNOSIS — N760 Acute vaginitis: Secondary | ICD-10-CM

## 2015-01-25 DIAGNOSIS — R102 Pelvic and perineal pain: Secondary | ICD-10-CM

## 2015-01-25 DIAGNOSIS — B9689 Other specified bacterial agents as the cause of diseases classified elsewhere: Secondary | ICD-10-CM

## 2015-01-25 LAB — URINALYSIS, ROUTINE W REFLEX MICROSCOPIC
Bilirubin Urine: NEGATIVE
Glucose, UA: NEGATIVE mg/dL
HGB URINE DIPSTICK: NEGATIVE
Ketones, ur: NEGATIVE mg/dL
Leukocytes, UA: NEGATIVE
NITRITE: NEGATIVE
PROTEIN: NEGATIVE mg/dL
Specific Gravity, Urine: >1.03 — ABNORMAL HIGH (ref 1.005–1.030)
UROBILINOGEN UA: 0.2 mg/dL (ref 0.0–1.0)
pH: 6 (ref 5.0–8.0)

## 2015-01-25 LAB — WET PREP, GENITAL
Trich, Wet Prep: NONE SEEN
Yeast Wet Prep HPF POC: NONE SEEN

## 2015-01-25 MED ORDER — IBUPROFEN 600 MG PO TABS
600.0000 mg | ORAL_TABLET | Freq: Once | ORAL | Status: AC
  Administered 2015-01-26: 600 mg via ORAL
  Filled 2015-01-25: qty 1

## 2015-01-25 NOTE — MAU Provider Note (Signed)
Chief Complaint: Abdominal Cramping   First Provider Initiated Contact with Patient 01/25/15 2325      SUBJECTIVE HPI: Brandy [name] is a 18 y.o. G2P1001 at [redacted]w[redacted]d by LMP who presents to [facility] reporting cramping x 2 days that she describes as intermittent, crampy, 7/10 on pain scale at worst. Hasn't tried anything for pain. Unsure what makes it batter or worse. Denies vaginal bleeding. LOF or vaginal discharge.  Ultrasound 12/14/2014 showed a single live intrauterine pregnancy measuring 8 weeks. Hasn't started [facility].   Past Medical History  Diagnosis Date  . Heart murmur    OB History  Gravida Para Term Preterm AB SAB TAB Ectopic Multiple Living  2 1 1       1     # Outcome Date GA Lbr Len/2nd Weight Sex Delivery Anes PTL Lv  2 Current           1 Term     M Vag-Spont   Y     Comments: System Generated. Please review and update pregnancy details.     History reviewed. No pertinent past surgical history. History   Social History  . Marital Status: Single    Spouse Name: N/A  . Number of Children: N/A  . Years of Education: N/A   Occupational History  . Not on file.   Social History Main Topics  . Smoking status: Never Smoker   . Smokeless tobacco: Never Used  . Alcohol Use: No  . Drug Use: No  . Sexual Activity: Yes    Birth Control/ Protection: None   Other Topics Concern  . Not on file   Social History Narrative   No current facility-administered medications on file prior to encounter.   Current Outpatient Prescriptions on File Prior to Encounter  Medication Sig Dispense Refill  . butalbital-acetaminophen-caffeine (FIORICET) 50-325-40 MG per tablet Take 1-2 tablets by mouth every 6 (six) hours as needed for headache. 20 tablet 0  . metoCLOPramide (REGLAN) 10 MG tablet Take 1 tablet (10 mg total) by mouth every 6 (six) hours. 30 tablet 0  . promethazine (PHENERGAN) 25 MG tablet Take 0.5-1 tablets (12.5-25 mg total) by mouth every 6 (six) hours as  needed. 30 tablet 0   Allergies  Allergen Reactions  . Kiwi Extract Swelling    Causes swelling of the mouth and throat.  Marland Kitchen Penicillins Hives    Review of Systems  Constitutional: Negative for fever and chills.  Gastrointestinal: Positive for abdominal pain. Negative for nausea, vomiting, diarrhea, constipation and blood in stool.  Genitourinary: Negative for dysuria, urgency, frequency, hematuria and flank pain.       Neg for vaginal discharge, bleeding or LOF.   Musculoskeletal: Negative for myalgias and back pain.    OBJECTIVE Blood pressure 116/61, pulse 86, temperature 98.7 F (37.1 C), temperature source Oral, height 5\' 4"  (1.626 m), weight 122 lb (55.339 kg), last menstrual period 10/11/2014, unknown if currently breastfeeding. GENERAL: Well-developed, well-nourished female in no acute distress.  HEART: normal rate RESP: normal effort GI: Abdomen soft, non-tender. Positive bowel sounds 4. MS: Nontender, no edema NEURO: Alert and oriented SPECULUM EXAM: NEFG, physiologic discharge, no blood noted, cervix clean BIMANUAL: cervix long and closed; uterus 14-week size, no adnexal tenderness or masses Fetal heart rate 151 by Doppler.  LAB RESULTS Results for orders placed or performed during the hospital encounter of 01/25/15 (from the past 24 hour(s))  Urinalysis, Routine w reflex microscopic (not at [facility])     Status: Abnormal   Collection  Time: 01/25/15 10:40 PM  Result Value Ref Range   Color, Urine YELLOW YELLOW   APPearance CLEAR CLEAR   Specific Gravity, Urine >1.030 (H) 1.005 - 1.030   pH 6.0 5.0 - 8.0   Glucose, UA NEGATIVE NEGATIVE mg/dL   Hgb urine dipstick NEGATIVE NEGATIVE   Bilirubin Urine NEGATIVE NEGATIVE   Ketones, ur NEGATIVE NEGATIVE mg/dL   Protein, ur NEGATIVE NEGATIVE mg/dL   Urobilinogen, UA 0.2 0.0 - 1.0 mg/dL   Nitrite NEGATIVE NEGATIVE   Leukocytes, UA NEGATIVE NEGATIVE  Wet prep, genital     Status: Abnormal   Collection Time: 01/25/15 11:33  PM  Result Value Ref Range   Yeast Wet Prep HPF POC NONE SEEN NONE SEEN   Trich, Wet Prep NONE SEEN NONE SEEN   Clue Cells Wet Prep HPF POC FEW (A) NONE SEEN   WBC, Wet Prep HPF POC FEW (A) NONE SEEN    IMAGING No results found.  MAU COURSE UA, wet prep, GC/chlamydia cultures, ibuprofen, warm compress.  Pain improve significantly. Now 4/10 on pain scale.  ASSESSMENT 1. BV (bacterial vaginosis)   2. Pain of round ligament affecting pregnancy, antepartum     PLAN Discharge home in stable condition.     Follow-up Information    Follow up with Obstetrician of your choice.   Why:  Routine prenatal visit      Follow up with [facility] [place] [facility].   Why:  As needed in emergencies   Contact information:   [address] [id number] mc [place] [address] [phone]       Medication List    TAKE these medications        butalbital-acetaminophen-caffeine 50-325-40 MG per tablet  Commonly known as:  FIORICET  Take 1-2 tablets by mouth every 6 (six) hours as needed for headache.     ibuprofen 600 MG tablet  Commonly known as:  ADVIL,MOTRIN  Take 1 tablet (600 mg total) by mouth every 6 (six) hours as needed for moderate pain.     metoCLOPramide 10 MG tablet  Commonly known as:  REGLAN  Take 1 tablet (10 mg total) by mouth every 6 (six) hours.     metroNIDAZOLE 500 MG tablet  Commonly known as:  FLAGYL  Take 1 tablet (500 mg total) by mouth 2 (two) times daily.     promethazine 25 MG tablet  Commonly known as:  PHENERGAN  Take 0.5-1 tablets (12.5-25 mg total) by mouth every 6 (six) hours as needed.         [place], CNM 01/26/2015  12:42 AM

## 2015-01-25 NOTE — MAU Note (Signed)
Pt reports having abd pain and cramping x2 days. Denies vag bleeding or discharge.

## 2015-01-26 DIAGNOSIS — A499 Bacterial infection, unspecified: Secondary | ICD-10-CM

## 2015-01-26 DIAGNOSIS — N760 Acute vaginitis: Secondary | ICD-10-CM

## 2015-01-26 MED ORDER — IBUPROFEN 600 MG PO TABS: 600.0000 mg | ORAL_TABLET | Freq: Four times a day (QID) | ORAL | Status: DC | PRN

## 2015-01-26 MED ORDER — METRONIDAZOLE 500 MG PO TABS: 500.0000 mg | ORAL_TABLET | Freq: Two times a day (BID) | ORAL | Status: DC

## 2015-01-26 NOTE — Discharge Instructions (Signed)
Bacterial Vaginosis Bacterial vaginosis is a vaginal infection that occurs when the normal balance of bacteria in the vagina is disrupted. It results from an overgrowth of certain bacteria. This is the most common vaginal infection in women of childbearing age. Treatment is important to prevent complications, especially in pregnant women, as it can cause a premature delivery. CAUSES  Bacterial vaginosis is caused by an increase in harmful bacteria that are normally present in smaller amounts in the vagina. Several different kinds of bacteria can cause bacterial vaginosis. However, the reason that the condition develops is not fully understood. RISK FACTORS Certain activities or behaviors can put you at an increased risk of developing bacterial vaginosis, including:  Having a new sex partner or multiple sex partners.  Douching.  Using an intrauterine device (IUD) for contraception. Women do not get bacterial vaginosis from toilet seats, bedding, swimming pools, or contact with objects around them. SIGNS AND SYMPTOMS  Some women with bacterial vaginosis have no signs or symptoms. Common symptoms include:  Grey vaginal discharge.  A fishlike odor with discharge, especially after sexual intercourse.  Itching or burning of the vagina and vulva.  Burning or pain with urination. DIAGNOSIS  Your health care provider will take a medical history and examine the vagina for signs of bacterial vaginosis. A sample of vaginal fluid may be taken. Your health care provider will look at this sample under a microscope to check for bacteria and abnormal cells. A vaginal pH test may also be done.  TREATMENT  Bacterial vaginosis may be treated with antibiotic medicines. These may be given in the form of a pill or a vaginal cream. A second round of antibiotics may be prescribed if the condition comes back after treatment.  HOME CARE INSTRUCTIONS   Only take over-the-counter or prescription medicines as  directed by your health care provider.  If antibiotic medicine was prescribed, take it as directed. Make sure you finish it even if you start to feel better.  Do not have sex until treatment is completed.  Tell all sexual partners that you have a vaginal infection. They should see their health care provider and be treated if they have problems, such as a mild rash or itching.  Practice safe sex by using condoms and only having one sex partner. SEEK MEDICAL CARE IF:   Your symptoms are not improving after 3 days of treatment.  You have increased discharge or pain.  You have a fever. MAKE SURE YOU:   Understand these instructions.  Will watch your condition.  Will get help right away if you are not doing well or get worse. FOR MORE INFORMATION  Centers for Disease Control and Prevention, Division of STD Prevention: SolutionApps.co.za American Sexual Health Association (ASHA): www.ashastd.org  Document Released: 07/20/2005 Document Revised: 05/10/2013 Document Reviewed: 03/01/2013 [facility] Patient Information 2015 [place], [place]. This information is not intended to replace advice given to you by your health care provider. Make sure you discuss any questions you have with your health care provider.  Round Ligament Pain During Pregnancy   Round ligament pain is a sharp pain or jabbing feeling often felt in the lower belly or groin area on one or both sides. It is one of the most common complaints during pregnancy and is considered a normal part of pregnancy. It is most often felt during the second trimester.   Here is what you need to know about round ligament pain, including some tips to help you feel better.   Causes of Round  Ligament Pain   Several thick ligaments surround and support your womb (uterus) as it grows during pregnancy. One of them is called the round ligament.   The round ligament connects the front part of the womb to your groin, the area where your legs attach to  your pelvis. The round ligament normally tightens and relaxes slowly.   As your baby and womb grow, the round ligament stretches. That makes it more likely to become strained.   Sudden movements can cause the ligament to tighten quickly, like a rubber band snapping. This causes a sudden and quick jabbing feeling.   Symptoms of Round Ligament Pain   Round ligament pain can be concerning and uncomfortable. But it is considered normal as your body changes during pregnancy.   The symptoms of round ligament pain include a sharp, sudden spasm in the belly. It usually affects the right side, but it may happen on both sides. The pain only lasts a few seconds.   Exercise may cause the pain, as will rapid movements such as:  sneezing  coughing  laughing  rolling over in bed  standing up too quickly   Treatment of Round Ligament Pain   Here are some tips that may help reduce your discomfort:   Pain relief. Take over-the-counter acetaminophen for pain, if necessary. Ask your doctor if this is OK.   Exercise. Get plenty of exercise to keep your stomach (core) muscles strong. Doing stretching exercises or prenatal yoga can be helpful. Ask your doctor which exercises are safe for you and your baby.   A helpful exercise involves putting your hands and knees on the floor, lowering your head, and pushing your backside into the air.   Avoid sudden movements. Change positions slowly (such as standing up or sitting down) to avoid sudden movements that may cause stretching and pain.   Flex your hips. Bend and flex your hips before you cough, sneeze, or laugh to avoid pulling on the ligaments.   Apply warmth. A heating pad or warm bath may be helpful. Ask your doctor if this is OK. Extreme heat can be dangerous to the baby.   You should try to modify your daily activity level and avoid positions that may worsen the condition.   When to Call the Doctor/Midwife   Always tell your doctor or midwife  about any type of pain you have during pregnancy. Round ligament pain is quick and doesn't last long.   Call your health care provider immediately if you have:  severe pain  fever  chills  pain on urination  difficulty walking   Belly pain during pregnancy can be due to many different causes. It is important for your doctor to rule out more serious conditions, including pregnancy complications such as placenta abruption or non-pregnancy illnesses such as:  inguinal hernia  appendicitis  stomach, liver, and kidney problems  Preterm labor pains may sometimes be mistaken for round ligament pain.    Second Trimester of Pregnancy The second trimester is from week 13 through week 28, months 4 through 6. The second trimester is often a time when you feel your best. Your body has also adjusted to being pregnant, and you begin to feel better physically. Usually, morning sickness has lessened or quit completely, you may have more energy, and you may have an increase in appetite. The second trimester is also a time when the fetus is growing rapidly. At the end of the sixth month, the fetus is about 9 inches  long and weighs about 1 pounds. You will likely begin to feel the baby move (quickening) between 18 and 20 weeks of the pregnancy. BODY CHANGES Your body goes through many changes during pregnancy. The changes vary from woman to woman.   Your weight will continue to increase. You will notice your lower abdomen bulging out.  You may begin to get stretch marks on your hips, abdomen, and breasts.  You may develop headaches that can be relieved by medicines approved by your health care provider.  You may urinate more often because the fetus is pressing on your bladder.  You may develop or continue to have heartburn as a result of your pregnancy.  You may develop constipation because certain hormones are causing the muscles that push waste through your intestines to slow down.  You may develop  hemorrhoids or swollen, bulging veins (varicose veins).  You may have back pain because of the weight gain and pregnancy hormones relaxing your joints between the bones in your pelvis and as a result of a shift in weight and the muscles that support your balance.  Your breasts will continue to grow and be tender.  Your gums may bleed and may be sensitive to brushing and flossing.  Dark spots or blotches (chloasma, mask of pregnancy) may develop on your face. This will likely fade after the baby is born.  A dark line from your belly button to the pubic area (linea nigra) may appear. This will likely fade after the baby is born.  You may have changes in your hair. These can include thickening of your hair, rapid growth, and changes in texture. Some women also have hair loss during or after pregnancy, or hair that feels dry or thin. Your hair will most likely return to normal after your baby is born. WHAT TO EXPECT AT YOUR PRENATAL VISITS During a routine prenatal visit:  You will be weighed to make sure you and the fetus are growing normally.  Your blood pressure will be taken.  Your abdomen will be measured to track your baby's growth.  The fetal heartbeat will be listened to.  Any test results from the previous visit will be discussed. Your health care provider may ask you:  How you are feeling.  If you are feeling the baby move.  If you have had any abnormal symptoms, such as leaking fluid, bleeding, severe headaches, or abdominal cramping.  If you have any questions. Other tests that may be performed during your second trimester include:  Blood tests that check for:  Low iron levels (anemia).  Gestational diabetes (between 24 and 28 weeks).  Rh antibodies.  Urine tests to check for infections, diabetes, or protein in the urine.  An ultrasound to confirm the proper growth and development of the baby.  An amniocentesis to check for possible genetic problems.  Fetal  screens for spina bifida and Down syndrome. HOME CARE INSTRUCTIONS   Avoid all smoking, herbs, alcohol, and unprescribed drugs. These chemicals affect the formation and growth of the baby.  Follow your health care provider's instructions regarding medicine use. There are medicines that are either safe or unsafe to take during pregnancy.  Exercise only as directed by your health care provider. Experiencing uterine cramps is a good sign to stop exercising.  Continue to eat regular, healthy meals.  Wear a good support bra for breast tenderness.  Do not use hot tubs, steam rooms, or saunas.  Wear your seat belt at all times when driving.  Avoid raw meat, uncooked cheese, cat litter boxes, and soil used by cats. These carry germs that can cause birth defects in the baby.  Take your prenatal vitamins.  Try taking a stool softener (if your health care provider approves) if you develop constipation. Eat more high-fiber foods, such as fresh vegetables or fruit and whole grains. Drink plenty of fluids to keep your urine clear or pale yellow.  Take warm sitz baths to soothe any pain or discomfort caused by hemorrhoids. Use hemorrhoid cream if your health care provider approves.  If you develop varicose veins, wear support hose. Elevate your feet for 15 minutes, 3-4 times a day. Limit salt in your diet.  Avoid heavy lifting, wear low heel shoes, and practice good posture.  Rest with your legs elevated if you have leg cramps or low back pain.  Visit your dentist if you have not gone yet during your pregnancy. Use a soft toothbrush to brush your teeth and be gentle when you floss.  A sexual relationship may be continued unless your health care provider directs you otherwise.  Continue to go to all your prenatal visits as directed by your health care provider. SEEK MEDICAL CARE IF:   You have dizziness.  You have mild pelvic cramps, pelvic pressure, or nagging pain in the abdominal  area.  You have persistent nausea, vomiting, or diarrhea.  You have a bad smelling vaginal discharge.  You have pain with urination. SEEK IMMEDIATE MEDICAL CARE IF:   You have a fever.  You are leaking fluid from your vagina.  You have spotting or bleeding from your vagina.  You have severe abdominal cramping or pain.  You have rapid weight gain or loss.  You have shortness of breath with chest pain.  You notice sudden or extreme swelling of your face, hands, ankles, feet, or legs.  You have not felt your baby move in over an hour.  You have severe headaches that do not go away with medicine.  You have vision changes. Document Released: 07/14/2001 Document Revised: 07/25/2013 Document Reviewed: 09/20/2012 [facility] Patient Information 2015 [place], [place]. This information is not intended to replace advice given to you by your health care provider. Make sure you discuss any questions you have with your health care provider.

## 2015-01-27 LAB — CULTURE, OB URINE: Special Requests: NORMAL

## 2015-01-28 ENCOUNTER — Other Ambulatory Visit: Payer: Self-pay | Admitting: Advanced Practice Midwife

## 2015-01-28 DIAGNOSIS — O98812 Other maternal infectious and parasitic diseases complicating pregnancy, second trimester: Principal | ICD-10-CM

## 2015-01-28 DIAGNOSIS — A749 Chlamydial infection, unspecified: Secondary | ICD-10-CM | POA: Insufficient documentation

## 2015-01-28 LAB — GC/CHLAMYDIA PROBE AMP (~~LOC~~) NOT AT ARMC
CHLAMYDIA, DNA PROBE: POSITIVE — AB
NEISSERIA GONORRHEA: NEGATIVE

## 2015-01-28 MED ORDER — AZITHROMYCIN 500 MG PO TABS: 1000.0000 mg | ORAL_TABLET | Freq: Once | ORAL | Status: DC

## 2015-01-28 NOTE — Progress Notes (Signed)
Dx Chlamydia. Rx Azithromycin. 

## 2015-01-29 ENCOUNTER — Encounter (HOSPITAL_COMMUNITY): Payer: Self-pay | Admitting: *Deleted

## 2015-02-02 ENCOUNTER — Encounter: Payer: Self-pay | Admitting: Advanced Practice Midwife

## 2015-02-02 ENCOUNTER — Telehealth: Payer: Self-pay | Admitting: Advanced Practice Midwife

## 2015-02-02 NOTE — Telephone Encounter (Signed)
Pt called for results, informed of +CT, [facility], pt agrees, discussed partner treatment. After further review of chart, azithromycin order has already been sent to patient's pharmacy, unsure if patient is aware, left message to call back to verify.

## 2015-02-04 ENCOUNTER — Encounter (HOSPITAL_COMMUNITY): Payer: Self-pay | Admitting: *Deleted

## 2015-02-04 ENCOUNTER — Inpatient Hospital Stay (HOSPITAL_COMMUNITY)
Admission: AD | Admit: 2015-02-04 | Discharge: 2015-02-04 | Disposition: A | Discharge status: Home / Self Care | Payer: Self-pay | Source: Ambulatory Visit | Attending: Obstetrics & Gynecology | Admitting: Obstetrics & Gynecology

## 2015-02-04 DIAGNOSIS — Z3A15 15 weeks gestation of pregnancy: Secondary | ICD-10-CM | POA: Insufficient documentation

## 2015-02-04 DIAGNOSIS — A5602 Chlamydial vulvovaginitis: Secondary | ICD-10-CM

## 2015-02-04 DIAGNOSIS — A749 Chlamydial infection, unspecified: Secondary | ICD-10-CM

## 2015-02-04 DIAGNOSIS — O98812 Other maternal infectious and parasitic diseases complicating pregnancy, second trimester: Secondary | ICD-10-CM | POA: Insufficient documentation

## 2015-02-04 DIAGNOSIS — O98312 Other infections with a predominantly sexual mode of transmission complicating pregnancy, second trimester: Secondary | ICD-10-CM

## 2015-02-04 HISTORY — DX: Chlamydial infection, unspecified: A74.9

## 2015-02-04 LAB — URINALYSIS, ROUTINE W REFLEX MICROSCOPIC
Bilirubin Urine: NEGATIVE
Glucose, UA: NEGATIVE mg/dL
Hgb urine dipstick: NEGATIVE
KETONES UR: NEGATIVE mg/dL
NITRITE: NEGATIVE
PROTEIN: NEGATIVE mg/dL
SPECIFIC GRAVITY, URINE: 1.025 (ref 1.005–1.030)
UROBILINOGEN UA: 1 mg/dL (ref 0.0–1.0)
pH: 6.5 (ref 5.0–8.0)

## 2015-02-04 LAB — URINE MICROSCOPIC-ADD ON

## 2015-02-04 LAB — WET PREP, GENITAL
Trich, Wet Prep: NONE SEEN
Yeast Wet Prep HPF POC: NONE SEEN

## 2015-02-04 LAB — CBC
HCT: 34.3 % — ABNORMAL LOW (ref 36.0–46.0)
Hemoglobin: 11.8 g/dL — ABNORMAL LOW (ref 12.0–15.0)
MCH: 33.9 pg (ref 26.0–34.0)
MCHC: 34.4 g/dL (ref 30.0–36.0)
MCV: 98.6 fL (ref 78.0–100.0)
Platelets: 282 10*3/uL (ref 150–400)
RBC: 3.48 MIL/uL — ABNORMAL LOW (ref 3.87–5.11)
RDW: 12.9 % (ref 11.5–15.5)
WBC: 4 10*3/uL (ref 4.0–10.5)

## 2015-02-04 MED ORDER — AZITHROMYCIN 1 G PO PACK
1.0000 g | PACK | Freq: Once | ORAL | Status: AC
  Administered 2015-02-04: 1 g via ORAL
  Filled 2015-02-04: qty 1

## 2015-02-04 NOTE — MAU Note (Addendum)
States she received a letter stating that she tested + for chlamydia and came straight here. States she is having "belly pain" that is a little worse than when she was here before. Worse with movement.

## 2015-02-04 NOTE — MAU Provider Note (Signed)
History     CSN: [id number]  Arrival date and time: 02/04/15 1224   First Provider Initiated Contact with Patient 02/04/15 1312      Chief Complaint  Patient presents with  . Abdominal Pain   HPI  Brandy [name] is a 19 y.o. G2P1001 at [redacted]w[redacted]d. She was notified has Chlamydia from MAU visit 6/24 and has not been treated yet. She continues to have low abd pain, R>L, worse with movement. No bleeding or spotting. No changes in discharge, odor or itching. No frequency urgency or dysuria, feels pressure with voiding. Sl constipated, sm BM last evening.   OB History    Gravida Para Term Preterm AB TAB SAB Ectopic Multiple Living   Past Medical History  Diagnosis Date  . Heart murmur   . Chlamydia     History reviewed. No pertinent past surgical history.  Family History  Problem Relation Age of Onset  . Asthma Mother     History  Substance Use Topics  . Smoking status: Never Smoker   . Smokeless tobacco: Never Used  . Alcohol Use: No    Allergies:  Allergies  Allergen Reactions  . Kiwi Extract Swelling    Causes swelling of the mouth and throat.  Marland Kitchen Penicillins Hives    Prescriptions prior to admission  Medication Sig Dispense Refill Last Dose  . acetaminophen (TYLENOL) 500 MG tablet Take 500 mg by mouth every 6 (six) hours as needed.   Past Week at Unknown time  . Prenatal Vit-Min-FA-Fish Oil ([employer] PRENATAL GUMMY) 0.4-113.5 MG CHEW Chew 1 tablet by mouth daily.   02/03/2015 at Unknown time  . butalbital-acetaminophen-caffeine (FIORICET) 50-325-40 MG per tablet Take 1-2 tablets by mouth every 6 (six) hours as needed for headache. (Patient not taking: Reported on 02/04/2015) 20 tablet 0 Past Week at Unknown time  . ibuprofen (ADVIL,MOTRIN) 600 MG tablet Take 1 tablet (600 mg total) by mouth every 6 (six) hours as needed for moderate pain. (Patient not taking: Reported on 02/04/2015) 30 tablet 0   . metoCLOPramide (REGLAN) 10 MG tablet Take 1 tablet (10 mg  total) by mouth every 6 (six) hours. (Patient not taking: Reported on 02/04/2015) 30 tablet 0   . metroNIDAZOLE (FLAGYL) 500 MG tablet Take 1 tablet (500 mg total) by mouth 2 (two) times daily. (Patient not taking: Reported on 02/04/2015) 14 tablet 0   . promethazine (PHENERGAN) 25 MG tablet Take 0.5-1 tablets (12.5-25 mg total) by mouth every 6 (six) hours as needed. (Patient not taking: Reported on 02/04/2015) 30 tablet 0 More than a month at Unknown time    Review of Systems  Constitutional: Negative for fever and chills.  Gastrointestinal: Positive for abdominal pain and constipation. Negative for nausea, vomiting and diarrhea.  Genitourinary: Negative for dysuria, urgency, frequency and hematuria.       Pressure with voiding No spotting or bleeding No change in discharge, odor or itching   Physical Exam   Blood pressure 107/60, pulse 100, temperature 98.3 F (36.8 C), temperature source Oral, resp. rate 18, height 5' 4.5" (1.638 m), weight 55.849 kg (123 lb 2 oz), last menstrual period 10/11/2014, unknown if currently breastfeeding.  Physical Exam  Nursing note and vitals reviewed. Constitutional: She is oriented to person, place, and time. She appears well-developed and well-nourished.  GI: Soft. There is tenderness (mild tenderness low abd).  Genitourinary:  Pelvic exam: Ext gen- nl anatomy, skin intact  Vagina- small amt thin greenish frothy discharge Cx- closed, thick Uterus- gravid, non tender Adn- non tender  Musculoskeletal: Normal range of motion.  Neurological: She is alert and oriented to person, place, and time.  Skin: Skin is warm and dry.  Psychiatric: She has a normal mood and affect. Her behavior is normal.    MAU Course  Procedures  MDM Results for orders placed or performed during the hospital encounter of 02/04/15 (from the past 24 hour(s))  Urinalysis, Routine w reflex microscopic (not at [facility])     Status: Abnormal   Collection Time: 02/04/15 12:33 PM   Result Value Ref Range   Color, Urine YELLOW YELLOW   APPearance CLEAR CLEAR   Specific Gravity, Urine 1.025 1.005 - 1.030   pH 6.5 5.0 - 8.0   Glucose, UA NEGATIVE NEGATIVE mg/dL   Hgb urine dipstick NEGATIVE NEGATIVE   Bilirubin Urine NEGATIVE NEGATIVE   Ketones, ur NEGATIVE NEGATIVE mg/dL   Protein, ur NEGATIVE NEGATIVE mg/dL   Urobilinogen, UA 1.0 0.0 - 1.0 mg/dL   Nitrite NEGATIVE NEGATIVE   Leukocytes, UA SMALL (A) NEGATIVE  Urine microscopic-add on     Status: None   Collection Time: 02/04/15 12:33 PM  Result Value Ref Range   Squamous Epithelial / LPF RARE RARE   WBC, UA 3-6 <3 WBC/hpf   Bacteria, UA RARE RARE  CBC     Status: Abnormal   Collection Time: 02/04/15  1:30 PM  Result Value Ref Range   WBC 4.0 4.0 - 10.5 K/uL   RBC 3.48 (L) 3.87 - 5.11 MIL/uL   Hemoglobin 11.8 (L) 12.0 - 15.0 g/dL   HCT 16.134.3 (L) 09.636.0 - 04.546.0 %   MCV 98.6 78.0 - 100.0 fL   MCH 33.9 26.0 - 34.0 pg   MCHC 34.4 30.0 - 36.0 g/dL   RDW 40.912.9 81.111.5 - 91.415.5 %   Platelets 282 150 - 400 K/uL  Wet prep, genital     Status: Abnormal   Collection Time: 02/04/15  1:59 PM  Result Value Ref Range   Yeast Wet Prep HPF POC NONE SEEN NONE SEEN   Trich, Wet Prep NONE SEEN NONE SEEN   Clue Cells Wet Prep HPF POC FEW (A) NONE SEEN   WBC, Wet Prep HPF POC FEW (A) NONE SEEN   CBC, U/A and wet prep are neg  Assessment and Plan  15 3/7 wks with Chlamydia- no sexual contact x 7 d after partner also treated Zithromax given today [facility]  Brandy [name] M. 02/04/2015, 1:22 PM

## 2015-02-21 ENCOUNTER — Encounter (HOSPITAL_COMMUNITY): Payer: Self-pay | Admitting: *Deleted

## 2015-02-21 ENCOUNTER — Inpatient Hospital Stay (HOSPITAL_COMMUNITY)
Admission: AD | Admit: 2015-02-21 | Discharge: 2015-02-22 | Disposition: A | Discharge status: Home / Self Care | Payer: Self-pay | Source: Ambulatory Visit | Attending: Family Medicine | Admitting: Family Medicine

## 2015-02-21 DIAGNOSIS — Z3A18 18 weeks gestation of pregnancy: Secondary | ICD-10-CM | POA: Insufficient documentation

## 2015-02-21 DIAGNOSIS — O9989 Other specified diseases and conditions complicating pregnancy, childbirth and the puerperium: Secondary | ICD-10-CM | POA: Insufficient documentation

## 2015-02-21 DIAGNOSIS — R1032 Left lower quadrant pain: Secondary | ICD-10-CM | POA: Insufficient documentation

## 2015-02-21 DIAGNOSIS — R109 Unspecified abdominal pain: Secondary | ICD-10-CM

## 2015-02-21 DIAGNOSIS — O26899 Other specified pregnancy related conditions, unspecified trimester: Secondary | ICD-10-CM | POA: Insufficient documentation

## 2015-02-21 DIAGNOSIS — M545 Low back pain: Secondary | ICD-10-CM | POA: Insufficient documentation

## 2015-02-21 LAB — URINALYSIS, DIPSTICK ONLY
Bilirubin Urine: NEGATIVE
Glucose, UA: NEGATIVE mg/dL
Hgb urine dipstick: NEGATIVE
Ketones, ur: NEGATIVE mg/dL
LEUKOCYTES UA: NEGATIVE
Nitrite: NEGATIVE
PH: 7 (ref 5.0–8.0)
Protein, ur: NEGATIVE mg/dL
Specific Gravity, Urine: 1.015 (ref 1.005–1.030)
Urobilinogen, UA: 0.2 mg/dL (ref 0.0–1.0)

## 2015-02-21 NOTE — MAU Note (Signed)
Pt c/o sharp shooting pain in low abd and vagina with low back pain for the past couple of days.  Took tylenol for pain with some relief. Denies any vag bleeding or discharge.  Reports starting to feel fetal movement.

## 2015-02-22 ENCOUNTER — Inpatient Hospital Stay (HOSPITAL_COMMUNITY): Payer: Self-pay

## 2015-02-22 DIAGNOSIS — R109 Unspecified abdominal pain: Secondary | ICD-10-CM

## 2015-02-22 DIAGNOSIS — O9989 Other specified diseases and conditions complicating pregnancy, childbirth and the puerperium: Secondary | ICD-10-CM

## 2015-02-22 DIAGNOSIS — O26899 Other specified pregnancy related conditions, unspecified trimester: Secondary | ICD-10-CM | POA: Insufficient documentation

## 2015-02-22 DIAGNOSIS — Z3A18 18 weeks gestation of pregnancy: Secondary | ICD-10-CM | POA: Insufficient documentation

## 2015-02-22 LAB — WET PREP, GENITAL
Trich, Wet Prep: NONE SEEN
Yeast Wet Prep HPF POC: NONE SEEN

## 2015-02-22 LAB — GC/CHLAMYDIA PROBE AMP (~~LOC~~) NOT AT ARMC
Chlamydia: NEGATIVE
Neisseria Gonorrhea: POSITIVE — AB

## 2015-02-22 LAB — CBC
HCT: 32.8 % — ABNORMAL LOW (ref 36.0–46.0)
Hemoglobin: 11.2 g/dL — ABNORMAL LOW (ref 12.0–15.0)
MCH: 34.3 pg — ABNORMAL HIGH (ref 26.0–34.0)
MCHC: 34.1 g/dL (ref 30.0–36.0)
MCV: 100.3 fL — AB (ref 78.0–100.0)
Platelets: 261 10*3/uL (ref 150–400)
RBC: 3.27 MIL/uL — ABNORMAL LOW (ref 3.87–5.11)
RDW: 12.9 % (ref 11.5–15.5)
WBC: 5.3 10*3/uL (ref 4.0–10.5)

## 2015-02-22 LAB — HIV ANTIBODY (ROUTINE TESTING W REFLEX): HIV Screen 4th Generation wRfx: NONREACTIVE

## 2015-02-22 MED ORDER — OXYCODONE-ACETAMINOPHEN 5-325 MG PO TABS
2.0000 | ORAL_TABLET | Freq: Once | ORAL | Status: AC
  Administered 2015-02-22: 2 via ORAL
  Filled 2015-02-22: qty 2

## 2015-02-22 MED ORDER — OXYCODONE-ACETAMINOPHEN 5-325 MG PO TABS: 1.0000 | ORAL_TABLET | Freq: Four times a day (QID) | ORAL | Status: DC | PRN

## 2015-02-22 NOTE — Discharge Instructions (Signed)

## 2015-02-22 NOTE — MAU Provider Note (Signed)
Chief Complaint: Abdominal Pain   First Provider Initiated Contact with Patient 02/22/15 0016      SUBJECTIVE HPI: Brandy [name] is a 19 y.o. G2P1001 at [redacted]w[redacted]d by LMP who presents to maternity admissions reporting cramping abdominal and low back pain x 2 days accompanied by sharp shooting pain in vagina.  She has not yet started prenatal care in this pregnancy. She is tearful with pain while in MAU.  She denies vaginal bleeding, vaginal itching/burning, urinary symptoms, constipation, h/a, dizziness, n/v, or fever/chills.     Abdominal Pain This is a new problem. The current episode started in the past 7 days. The onset quality is sudden. The problem occurs intermittently. The most recent episode lasted 2 days. The problem has been gradually worsening. The pain is located in the LLQ and RLQ. The pain is moderate. The quality of the pain is cramping and sharp. The abdominal pain radiates to the pelvis. Pertinent negatives include no constipation, diarrhea, dysuria, fever, frequency, headaches, nausea or vomiting. She has tried nothing for the symptoms.    Past Medical History  Diagnosis Date  . Heart murmur   . Chlamydia    History reviewed. No pertinent past surgical history. History   Social History  . Marital Status: Single    Spouse Name: N/A  . Number of Children: N/A  . Years of Education: N/A   Occupational History  . Not on file.   Social History Main Topics  . Smoking status: Never Smoker   . Smokeless tobacco: Never Used  . Alcohol Use: No  . Drug Use: No  . Sexual Activity: Yes    Birth Control/ Protection: None     Comment: last sex Feb 01 2015   Other Topics Concern  . Not on file   Social History Narrative   No current facility-administered medications on file prior to encounter.   Current Outpatient Prescriptions on File Prior to Encounter  Medication Sig Dispense Refill  . acetaminophen (TYLENOL) 500 MG tablet Take 500 mg by mouth every 6 (six) hours as  needed.    . Prenatal Vit-Min-FA-Fish Oil ([employer] PRENATAL GUMMY) 0.4-113.5 MG CHEW Chew 1 tablet by mouth daily.     Allergies  Allergen Reactions  . Kiwi Extract Swelling    Causes swelling of the mouth and throat.  Marland Kitchen Penicillins Hives    Review of Systems  Constitutional: Negative for fever, chills and malaise/fatigue.  Eyes: Negative for blurred vision.  Respiratory: Negative for cough and shortness of breath.   Cardiovascular: Negative for chest pain.  Gastrointestinal: Positive for abdominal pain. Negative for heartburn, nausea, vomiting, diarrhea and constipation.  Genitourinary: Negative for dysuria, urgency and frequency.  Musculoskeletal: Negative.   Neurological: Negative for dizziness and headaches.  Psychiatric/Behavioral: Negative for depression.    OBJECTIVE Blood pressure 117/64, pulse 79, temperature 98.1 F (36.7 C), temperature source Oral, resp. rate 16, height  (1.626 m), weight 57.607 kg (127 lb), last menstrual period 10/11/2014, unknown if currently breastfeeding. GENERAL: Well-developed, well-nourished female in no acute distress.  EYES: normal sclera/conjunctiva; no lid-lag HENT: Atraumatic, normocephalic HEART: normal rate RESP: normal effort ABDOMEN: Soft, tender in lower mid abdomen and suprapubic area, no rebound tenderness or guarding MUSCULOSKELETAL: Normal ROM EXTREMITIES: Nontender, no edema NEURO/PSYCH: Alert and oriented, appropriate affect  PELVIC EXAM: Cervix pink, visually closed, without lesion, scant white creamy discharge, vaginal walls and external genitalia normal Bimanual exam: Cervix 0/long/high, firm, anterior, neg CMT, uterus with significant tenderness, ~18 week size, adnexa without tenderness,  enlargement, or mass  FHT present by doppler  LAB RESULTS No results found for this or any previous visit (from the past 24 hour(s)). Results for orders placed or performed during the hospital encounter of 02/21/15 (from the past 168  hour(s))  GC/Chlamydia probe amp ([place])not at [facility]   Collection Time: 02/21/15 12:00 AM  Result Value Ref Range   Report Status 02/23/2015 FINAL   Urinalysis, dipstick only   Collection Time: 02/21/15 10:30 PM  Result Value Ref Range   Specific Gravity, Urine 1.015 1.005 - 1.030   pH 7.0 5.0 - 8.0   Glucose, UA NEGATIVE NEGATIVE mg/dL   Hgb urine dipstick NEGATIVE NEGATIVE   Bilirubin Urine NEGATIVE NEGATIVE   Ketones, ur NEGATIVE NEGATIVE mg/dL   Protein, ur NEGATIVE NEGATIVE mg/dL   Urobilinogen, UA 0.2 0.0 - 1.0 mg/dL   Nitrite NEGATIVE NEGATIVE   Leukocytes, UA NEGATIVE NEGATIVE  Wet prep, genital   Collection Time: 02/21/15 11:58 PM  Result Value Ref Range   Yeast Wet Prep HPF POC NONE SEEN NONE SEEN   Trich, Wet Prep NONE SEEN NONE SEEN   Clue Cells Wet Prep HPF POC FEW (A) NONE SEEN   WBC, Wet Prep HPF POC MODERATE (A) NONE SEEN  CBC   Collection Time: 02/22/15 12:01 AM  Result Value Ref Range   WBC 5.3 4.0 - 10.5 K/uL   RBC 3.27 (L) 3.87 - 5.11 MIL/uL   Hemoglobin 11.2 (L) 12.0 - 15.0 g/dL   HCT 16.1 (L) 09.6 - 04.5 %   MCV 100.3 (H) 78.0 - 100.0 fL   MCH 34.3 (H) 26.0 - 34.0 pg   MCHC 34.1 30.0 - 36.0 g/dL   RDW 40.9 81.1 - 91.4 %   Platelets 261 150 - 400 K/uL  HIV antibody   Collection Time: 02/22/15 12:01 AM  Result Value Ref Range    B NEG  IMAGING Preliminary OB Limited with normal FHR, amniotic fluid, and placenta  MAU Management CBC, U/A, wet prep ordered and reviewed.  Pelvic exam with no specific adnexal tenderness but suprapubic tenderness and generalized low abdominal tenderness noted.  Percocet 5/325 x 2 tabs given in MAU related to pain, which improved but did not eliminate pt pain.  Limited OB U/S to evaluate placenta/cervical length.  Reviewed normal results of U/S.  Will manage pain as musculoskeletal with [facility] lab results pending.  Pt stable at time of discharge.  ASSESSMENT 1. Abdominal pain affecting pregnancy     PLAN Discharge  home     Medication List    TAKE these medications        acetaminophen 500 MG tablet  Commonly known as:  TYLENOL  Take 500 mg by mouth every 6 (six) hours as needed.     [employer] PRENATAL GUMMY 0.4-113.5 MG Chew  Chew 1 tablet by mouth daily.     oxyCODONE-acetaminophen 5-325 MG per tablet  Commonly known as:  PERCOCET/ROXICET  Take 1-2 tablets by mouth every 6 (six) hours as needed for severe pain.       Follow-up Information    Follow up with [facility] DE[facility] [place].   Why:  As scheduled   Contact information:   [address] [place] [address] [phone]      Follow up with [facility] [place] [facility]ERN[facility] ADMISS[facility].   Why:  As needed for emergencies   Contact information:   [address] [id number] mc [place] [address] [phone]  Sharen Counter Certified Nurse-Midwife 02/26/2015  8:09 AM

## 2015-02-23 LAB — CULTURE, OB URINE: Culture: 9000

## 2015-03-05 ENCOUNTER — Emergency Department (HOSPITAL_COMMUNITY)
Admission: EM | Admit: 2015-03-05 | Discharge: 2015-03-05 | Disposition: A | Discharge status: Home / Self Care | Payer: Self-pay | Attending: Emergency Medicine | Admitting: Emergency Medicine

## 2015-03-05 ENCOUNTER — Encounter (HOSPITAL_COMMUNITY): Payer: Self-pay | Admitting: Emergency Medicine

## 2015-03-05 DIAGNOSIS — O9989 Other specified diseases and conditions complicating pregnancy, childbirth and the puerperium: Secondary | ICD-10-CM | POA: Insufficient documentation

## 2015-03-05 DIAGNOSIS — Z3A19 19 weeks gestation of pregnancy: Secondary | ICD-10-CM | POA: Insufficient documentation

## 2015-03-05 DIAGNOSIS — S50862A Insect bite (nonvenomous) of left forearm, initial encounter: Secondary | ICD-10-CM | POA: Insufficient documentation

## 2015-03-05 DIAGNOSIS — Y999 Unspecified external cause status: Secondary | ICD-10-CM | POA: Insufficient documentation

## 2015-03-05 DIAGNOSIS — Z8619 Personal history of other infectious and parasitic diseases: Secondary | ICD-10-CM | POA: Insufficient documentation

## 2015-03-05 DIAGNOSIS — Y929 Unspecified place or not applicable: Secondary | ICD-10-CM | POA: Insufficient documentation

## 2015-03-05 DIAGNOSIS — R011 Cardiac murmur, unspecified: Secondary | ICD-10-CM | POA: Insufficient documentation

## 2015-03-05 DIAGNOSIS — Z880 Allergy status to penicillin: Secondary | ICD-10-CM | POA: Insufficient documentation

## 2015-03-05 DIAGNOSIS — O9A212 Injury, poisoning and certain other consequences of external causes complicating pregnancy, second trimester: Secondary | ICD-10-CM | POA: Insufficient documentation

## 2015-03-05 DIAGNOSIS — W57XXXA Bitten or stung by nonvenomous insect and other nonvenomous arthropods, initial encounter: Secondary | ICD-10-CM | POA: Insufficient documentation

## 2015-03-05 DIAGNOSIS — Y939 Activity, unspecified: Secondary | ICD-10-CM | POA: Insufficient documentation

## 2015-03-05 MED ORDER — DIPHENHYDRAMINE HCL 25 MG PO CAPS
25.0000 mg | ORAL_CAPSULE | Freq: Once | ORAL | Status: AC
  Administered 2015-03-05: 25 mg via ORAL
  Filled 2015-03-05: qty 1

## 2015-03-05 MED ORDER — DIPHENHYDRAMINE HCL 25 MG PO TABS: 25.0000 mg | ORAL_TABLET | Freq: Four times a day (QID) | ORAL | Status: DC

## 2015-03-05 NOTE — Discharge Instructions (Signed)
Insect Bite Return for rash, swelling, or numbness/tingling to the arm. Mosquitoes, flies, fleas, bedbugs, and many other insects can bite. Insect bites are different from insect stings. A sting is when venom is injected into the skin. Some insect bites can transmit infectious diseases. SYMPTOMS  Insect bites usually turn red, swell, and itch for 2 to 4 days. They often go away on their own. TREATMENT  Your caregiver may prescribe antibiotic medicines if a bacterial infection develops in the bite. HOME CARE INSTRUCTIONS  Do not scratch the bite area.  Keep the bite area clean and dry. Wash the bite area thoroughly with soap and water.  Put ice or cool compresses on the bite area.  Put ice in a plastic bag.  Place a towel between your skin and the bag.  Leave the ice on for 20 minutes, 4 times a day for the first 2 to 3 days, or as directed.  You may apply a baking soda paste, cortisone cream, or calamine lotion to the bite area as directed by your caregiver. This can help reduce itching and swelling.  Only take over-the-counter or prescription medicines as directed by your caregiver.  If you are given antibiotics, take them as directed. Finish them even if you start to feel better. You may need a tetanus shot if:  You cannot remember when you had your last tetanus shot.  You have never had a tetanus shot.  The injury broke your skin. If you get a tetanus shot, your arm may swell, get red, and feel warm to the touch. This is common and not a problem. If you need a tetanus shot and you choose not to have one, there is a rare chance of getting tetanus. Sickness from tetanus can be serious. SEEK IMMEDIATE MEDICAL CARE IF:   You have increased pain, redness, or swelling in the bite area.  You see a red line on the skin coming from the bite.  You have a fever.  You have joint pain.  You have a headache or neck pain.  You have unusual weakness.  You have a rash.  You have  chest pain or shortness of breath.  You have abdominal pain, nausea, or vomiting.  You feel unusually tired or sleepy. MAKE SURE YOU:   Understand these instructions.  Will watch your condition.  Will get help right away if you are not doing well or get worse. Document Released: 08/27/2004 Document Revised: 10/12/2011 Document Reviewed: 02/18/2011 [facility] Patient Information 2015 [place], [place]. This information is not intended to replace advice given to you by your health care provider. Make sure you discuss any questions you have with your health care provider.  Emergency Department Resource Guide 1) Find a Doctor and Pay Out of Pocket Although you won't have to find out who is covered by your insurance plan, it is a good idea to ask around and get recommendations. You will then need to call the office and see if the doctor you have chosen will accept you as a new patient and what types of options they offer for patients who are self-pay. Some doctors offer discounts or will set up payment plans for their patients who do not have insurance, but you will need to ask so you aren't surprised when you get to your appointment.  2) Contact Your Local Health Department Not all health departments have doctors that can see patients for sick visits, but many do, so it is worth a call to see if yours  does. If you don't know where your local health department is, you can check in your phone book. The CDC also has a tool to help you locate your state's health department, and many state websites also have listings of all of their local health departments.  3) Find a Walk-in Clinic If your illness is not likely to be very severe or complicated, you may want to try a walk in clinic. These are popping up all over the country in pharmacies, drugstores, and shopping centers. They're usually staffed by nurse practitioners or physician assistants that have been trained to treat common illnesses and complaints.  They're usually fairly quick and inexpensive. However, if you have serious medical issues or chronic medical problems, these are probably not your best option.  No Primary Care Doctor: - Call Health Connect at  [phone] - they can help you locate a primary care doctor that  accepts your insurance, provides certain services, etc. - Physician Referral Service- [phone]  Chronic Pain Problems: Organization         Address  Phone   Notes  [facility]  [phone] Patients need to be referred by their primary care doctor.   Medication Assistance: Organization         Address  Phone   Notes  [facility] Medication [facility] [address] [place] [place], [place] [address] [phone] --Must be a resident of [facility] -- Must have NO insurance coverage whatsoever (no Medicaid/ Medicare, etc.) -- The pt. MUST have a primary care doctor that directs their care regularly and follows them in the community   [facility]ist  [phone]   [employer]  [phone]    Agencies that provide inexpensive medical care: Organization         Address  Phone   Notes  [facility]  [phone]   [facility]    [phone]   [facility] [address] [place], [place] [address] [phone]   [facility] [address] [place] [place] [phone]   Planned Parenthood    [phone]   [facility]    [phone]   [facility]  [address], [place] Phone:  [phone], Fax:  [phone] Hours of Operation:  9 am - 6 pm, M-F.  Also accepts Medicaid/Medicare and self-pay.  [facility]  [address], [place] Phone: [phone], Fax: [phone]. Hours of Operation:  8:30 am - 5:30 pm, M-F.  Also accepts Medicaid and self-pay.  [facility] [place] [address], [place]  Phone: [phone]   [facility] [address] Natasha Bence [place], [place] [phone], Ext. [phone] Mondays & Thursdays: 7-9 AM.  First 15 patients are seen on a first come, first serve basis.    Medicaid-accepting [facility] Providers:  Organization         Address  Phone   Notes  [facility] [address], [place] [phone] Also accepts self-pay patients.  [facility] [address] Laurell Josephs [place], [place]  [phone]   [facility] [address], [place] [phone]   [facility] [address] [place] [phone]   Renaye Rakers [address], [place]   [phone]  Only accepts [place] patients after they have their name applied to their card.   Self-Pay (no insurance) in [facility]:  Organization         Address  Phone   Notes  Sickle Cell Patients, [facility] [address] [place], [place] [phone]   [facility] [address] [place], [place] [phone]   [facility] [place]  [address], [place] [phone]   [facility]/Dr. Osei-Bonsu  [address], [place] or [address], [place] [phone] Phone number for both [place] and [place] locations is the same.  [facility] [address], [place] [phone]   [facility] [address], [place] or [address] [phone]   [facility] [address] [place] [phone], phone; [phone], fax Sees patients 1st and 3rd Saturday of every month.  Must not qualify for public or private insurance (i.e. Medicaid, Medicare, [place] Health Choice, Veterans' Benefits)  Household income should be no more than 200% of the poverty level The clinic cannot  treat you if you are pregnant or think you are pregnant  Sexually transmitted diseases are not treated at the clinic.    Dental Care: Organization         Address  Phone  Notes  [facility] Department of Phys[facility] [address] [place], [place] [phone] Accepts children up to age 7 who are enrolled in [place] or [place] Health Choice; pregnant women with a Medicaid card; and children who have applied for Medicaid or [place] Health Choice, but were declined, whose parents can pay a reduced fee at time of service.  [facility] Department of [facility]  [address], [place] [phone] Accepts children up to age 71 who are enrolled in [place] or [place] Health Choice; pregnant women with a Medicaid card; and children who have applied for Medicaid or [place] Health Choice, but were declined, whose parents can pay a reduced fee at time of service.  [facility]  [address] [place], [place] [phone] Patients are seen by appointment only. Walk-ins are not accepted. [facility] will see patients 10 years of age and older. Monday - Tuesday (8am-5pm) Most Wednesdays (8:30-5pm) $30 per visit, cash only  [facility] Adult Dental Access PRO[facility]  [address], [facility] [phone] Patients are seen by appointment only. Walk-ins are not accepted. [facility] will see patients 65 years of age and older. One Wednesday Evening (Monthly: Volunteer Based).  $30 per visit, cash only  [employer]  [phone] for adults; Children under age 32, call Graduate Pediatric Dentistry at [phone]. Children aged 49-14, please call [phone] to request a pediatric application.  Dental services are provided in all areas of dental care including fillings, crowns and bridges, complete and partial dentures, implants, gum treatment, root canals, and extractions. Preventive care is also provided.  Treatment is provided to both adults and children. Patients are selected via a lottery and there is often a waiting list.   [facility] [address] [place]  [phone] [email or web address]   [facility] [address] [place], [place] [phone], Ext. [phone] Second and Fourth Thursday of each month, opens at 6:30 AM; Clinic  ends at 9 AM.  Patients are seen on a first-come first-served basis, and a limited number are seen during each clinic.   [facility]  [address] Ether Griffins [place], [place] [phone]   Eligibility Requirements You must have lived in [place], [place], or [place] counties for at least the last three months.   You cannot be eligible for state or federal sponsored [place], including [employer], [place], or [employer].   You generally cannot be eligible for healthcare insurance through your employer.    How to apply: Eligibility screenings are held every Tuesday and Wednesday afternoon from 1:00 pm until 4:00 pm. You do not need an appointment for the interview!  [facility] [address] [place], [place] [phone]   [facility] Health Department  [phone]   [facility] Health Department  [phone]   [facility] Health Department  [phone]    Behavioral Health Resources in the Community: Intensive Outpatient Programs Organization         Address  Phone  Notes  [facility] [address] [place], [place] [phone]   [facility] [address] [place], [place] [phone]   [facility] [address] [place], [place]  [phone]   [facility] Mental Health [address]  [place], [place] [phone] or [phone]   Substance Abuse Resources Organization         Address  Phone  Notes  [facility]  [phone]   [facility]   [phone]   [facility] [place]  [phone]   Floydene Flock  [phone]   Residential & Outpatient Substance Abuse Program  [phone]   Psychological Services Organization         Address  Phone  Notes  [facility]  [phone]   [facility]  [phone]   [facility] Mental [facility] [address], [place] [phone] or [phone]    Mobile Crisis Teams Organization         Address  Phone  Notes  [facility], Mobile Crisis Care Unit  [phone]   [facility]  [address] [place], [place] [phone]   Doristine Locks [address] [place] [phone]    Self-Help/Support Groups Organization         Address  Phone             Notes  [facility] - variety of support groups  [phone]- [phone] Call for more information  Narcotics Anonymous (NA), [facility] [address], [employer] [place]  2 meetings at this location   Statistician         Address  Phone  Notes  [facility] [address],    [place]  [phone]   [facility]  [address], [place] [address], [place], [place] [phone]   [facility] [address] [place], [place] [phone] Admissions: 8am-3pm M-F  [facility] [address]    [place], [place] [phone]   [facility] [address] Starling Manns [place], [place] [phone]   [facility] [address]  [place], [place] [phone]   [facility] - Intensive Outpatient [address] Laurell Josephs [address], [place], [place] [phone]   [facility] ([facility] [address] Henderson Cloud  [place], [place] [phone] or [phone]   [facility]idential  Treatment Services ([facility]) [address] [place], [place] [phone] Accepts Medicaid  [facility] [address],  [place] [phone] Substance  Abuse/Addiction Treatment   [facility] Organization         Address  Phone  Notes  [facility]  [phone]   Angie Fava, PhD [address] Ervin Knack [place], [place]   [phone] or [phone]   [facility]al   [address] [place], [place] [phone]   [facility] [address] [place], [place] [phone] Insurance/Medicaid/sponsorship through [facility] and Families [address]                                    [place], [place] [phone] Therapy/tele-psych/case  [facility] [address], [place] [phone]    Dr. Lolly Mustache  [phone]   [facility]  [employer] [facility] Dept. 1) [address], [place] 2) [address], [place] 3)  [address], [place] [phone]  [phone]   [facility] Child Abuse Hotline 757-812-1273 or [phone] (After Hours)

## 2015-03-05 NOTE — ED Notes (Signed)
Pt st's she was bitten on left forearm by a black spider short while ago.  C/o burning to area.  Pt has reddened area to forearm

## 2015-03-05 NOTE — ED Provider Notes (Signed)
CSN: [id number]     Arrival date & time 03/05/15  1625 History  This chart was scribed for non-physician practitioner, Catha Gosselin, PA-C working with Raeford Razor, MD by Placido Sou, ED scribe. This patient was seen in room TR08C/TR08C and the patient's care was started at 4:56 PM.   Chief Complaint  Patient presents with  . Insect Bite   The history is provided by the patient. No language interpreter was used.    HPI Comments: Brandy [name] is a 19 y.o. female who presents to the Emergency Department complaining of an insect bite to her left forearm that occurred 30 minutes ago. Pt notes that she was bitten by a black bug and within a short period of time began to notice associated, radiating, pain that she describes as burning throughout her left arm with diffuse itching. Pt notes being [redacted] weeks pregnant and denies taking any medications PTA. She denies fever or chills.   Past Medical History  Diagnosis Date  . Heart murmur   . Chlamydia    History reviewed. No pertinent past surgical history. Family History  Problem Relation Age of Onset  . Asthma Mother    History  Substance Use Topics  . Smoking status: Never Smoker   . Smokeless tobacco: Never Used  . Alcohol Use: No   OB History    Gravida Para Term Preterm AB TAB SAB Ectopic Multiple Living   Review of Systems  Constitutional: Negative for fever and chills.  Musculoskeletal: Positive for myalgias.  Skin: Negative for color change, rash and wound.    Allergies  Kiwi extract and Penicillins  Home Medications   Prior to Admission medications   Medication Sig Start Date End Date Taking? Authorizing Provider  acetaminophen (TYLENOL) 500 MG tablet Take 500 mg by mouth every 6 (six) hours as needed.    Historical Provider, MD  diphenhydrAMINE (BENADRYL) 25 MG tablet Take 1 tablet (25 mg total) by mouth every 6 (six) hours. 03/05/15   Lakeria Starkman Patel-Mills, PA-C  oxyCODONE-acetaminophen  (PERCOCET/ROXICET) 5-325 MG per tablet Take 1-2 tablets by mouth every 6 (six) hours as needed for severe pain. 02/22/15   Wilmer Floor Leftwich-Kirby, CNM  Prenatal Vit-Min-FA-Fish Oil ([facility] PRENATAL GUMMY) 0.4-113.5 MG CHEW Chew 1 tablet by mouth daily.    Historical Provider, MD   BP 93/61 mmHg  Pulse 84  Temp(Src) 98.6 F (37 C) (Oral)  Resp 20  Ht  (1.626 m)  Wt 128 lb (58.06 kg)  BMI 21.96 kg/m2  SpO2 99%  LMP 10/11/2014 Physical Exam  Constitutional: She is oriented to person, place, and time. She appears well-developed and well-nourished. No distress.  HENT:  Head: Normocephalic and atraumatic.  Eyes: Conjunctivae and EOM are normal. Pupils are equal, round, and reactive to light.  Neck: Normal range of motion. Neck supple. No tracheal deviation present.  Cardiovascular: Normal rate.   Pulmonary/Chest: Effort normal. No respiratory distress.  Musculoskeletal: Normal range of motion.  Neurological: She is alert and oriented to person, place, and time.  Skin: Skin is warm and dry.  She has no rash or erythema on the left arm or shoulder. There is no edema or puncture wound on the arm. 5/5 grip strength. 2+ radial pulse. Able to flex and extend elbow as well as wrist and fingers.  Psychiatric: She has a normal mood and affect. Her behavior is normal.  Nursing note and vitals reviewed.  ED Course  Procedures  DIAGNOSTIC STUDIES: Oxygen Saturation is 99% on RA, normal by my interpretation.    COORDINATION OF CARE: 4:57 PM Discussed treatment plan with pt at bedside and pt agreed to plan.  Labs Review Labs Reviewed - No data to display  Imaging Review No results found.   EKG Interpretation None      MDM   Final diagnoses:  Insect bite  Patient presents for insect bite to the left arm with a burning sensation. Patient was given ice and is elevating the arm in the room. She was also given Benadryl for itching and burning. Recheck: Patient still has no rash or swelling  to the arm.  I discussed return precautions after observing her for 30 minutes. Patient verbally agrees with the plan. I personally performed the services described in this documentation, which was scribed in my presence. The recorded information has been reviewed and is accurate.    Catha Gosselin, PA-C 03/05/15 2141  Raeford Razor, MD 03/06/15 [phone]

## 2015-03-09 ENCOUNTER — Inpatient Hospital Stay (HOSPITAL_COMMUNITY)
Admission: AD | Admit: 2015-03-09 | Discharge: 2015-03-09 | Disposition: A | Discharge status: Home / Self Care | Payer: Self-pay | Source: Ambulatory Visit | Attending: Obstetrics & Gynecology | Admitting: Obstetrics & Gynecology

## 2015-03-09 ENCOUNTER — Encounter (HOSPITAL_COMMUNITY): Payer: Self-pay | Admitting: *Deleted

## 2015-03-09 DIAGNOSIS — O98812 Other maternal infectious and parasitic diseases complicating pregnancy, second trimester: Secondary | ICD-10-CM | POA: Insufficient documentation

## 2015-03-09 DIAGNOSIS — B009 Herpesviral infection, unspecified: Secondary | ICD-10-CM | POA: Diagnosis present

## 2015-03-09 DIAGNOSIS — Z880 Allergy status to penicillin: Secondary | ICD-10-CM | POA: Insufficient documentation

## 2015-03-09 DIAGNOSIS — O0932 Supervision of pregnancy with insufficient antenatal care, second trimester: Secondary | ICD-10-CM

## 2015-03-09 DIAGNOSIS — R102 Pelvic and perineal pain unspecified side: Secondary | ICD-10-CM | POA: Diagnosis present

## 2015-03-09 DIAGNOSIS — O98811 Other maternal infectious and parasitic diseases complicating pregnancy, first trimester: Secondary | ICD-10-CM

## 2015-03-09 DIAGNOSIS — Z3A12 12 weeks gestation of pregnancy: Secondary | ICD-10-CM

## 2015-03-09 DIAGNOSIS — Z3A20 20 weeks gestation of pregnancy: Secondary | ICD-10-CM | POA: Insufficient documentation

## 2015-03-09 DIAGNOSIS — M899 Disorder of bone, unspecified: Secondary | ICD-10-CM

## 2015-03-09 DIAGNOSIS — B3731 Acute candidiasis of vulva and vagina: Secondary | ICD-10-CM

## 2015-03-09 DIAGNOSIS — B373 Candidiasis of vulva and vagina: Secondary | ICD-10-CM | POA: Insufficient documentation

## 2015-03-09 LAB — CBC WITH DIFFERENTIAL/PLATELET
BASOS ABS: 0 10*3/uL (ref 0.0–0.1)
Basophils Relative: 0 % (ref 0–1)
EOS PCT: 1 % (ref 0–5)
Eosinophils Absolute: 0 10*3/uL (ref 0.0–0.7)
HCT: 34.1 % — ABNORMAL LOW (ref 36.0–46.0)
HEMOGLOBIN: 11.6 g/dL — AB (ref 12.0–15.0)
LYMPHS PCT: 33 % (ref 12–46)
Lymphs Abs: 1.4 10*3/uL (ref 0.7–4.0)
MCH: 34.5 pg — AB (ref 26.0–34.0)
MCHC: 34 g/dL (ref 30.0–36.0)
MCV: 101.5 fL — AB (ref 78.0–100.0)
Monocytes Absolute: 0.3 10*3/uL (ref 0.1–1.0)
Monocytes Relative: 6 % (ref 3–12)
NEUTROS PCT: 60 % (ref 43–77)
Neutro Abs: 2.6 10*3/uL (ref 1.7–7.7)
PLATELETS: 258 10*3/uL (ref 150–400)
RBC: 3.36 MIL/uL — ABNORMAL LOW (ref 3.87–5.11)
RDW: 12.9 % (ref 11.5–15.5)
WBC: 4.3 10*3/uL (ref 4.0–10.5)

## 2015-03-09 LAB — URINALYSIS, ROUTINE W REFLEX MICROSCOPIC
Bilirubin Urine: NEGATIVE
Glucose, UA: NEGATIVE mg/dL
Hgb urine dipstick: NEGATIVE
Ketones, ur: NEGATIVE mg/dL
Nitrite: NEGATIVE
Protein, ur: NEGATIVE mg/dL
Specific Gravity, Urine: 1.025 (ref 1.005–1.030)
UROBILINOGEN UA: 0.2 mg/dL (ref 0.0–1.0)
pH: 6 (ref 5.0–8.0)

## 2015-03-09 LAB — URINE MICROSCOPIC-ADD ON

## 2015-03-09 LAB — WET PREP, GENITAL
CLUE CELLS WET PREP: NONE SEEN
Trich, Wet Prep: NONE SEEN

## 2015-03-09 MED ORDER — FLUCONAZOLE 150 MG PO TABS: 150.0000 mg | ORAL_TABLET | Freq: Once | ORAL | Status: DC

## 2015-03-09 MED ORDER — IBUPROFEN 800 MG PO TABS
400.0000 mg | ORAL_TABLET | Freq: Once | ORAL | Status: AC
  Administered 2015-03-09: 400 mg via ORAL
  Filled 2015-03-09: qty 1

## 2015-03-09 NOTE — Discharge Instructions (Signed)

## 2015-03-09 NOTE — MAU Provider Note (Signed)
History     CSN: [id number]  Arrival date and time: 03/09/15 1122   First Provider Initiated Contact with Patient 03/09/15 1318      Chief Complaint  Patient presents with  . Vaginal Pain  . Pelvic Pain   This is a 19 y.o. female at [redacted]w[redacted]d by 8 week [place] who presents with c/o vaginal and pubic bone pain for 2 days. States the pubic bone pain "is just like growing pains" and has been there "for a while".  The main complaint is vaginal introitus pain which started a few days ago "when I used a new soap".  States her mother put cortisone cream on it and it burned more. Pain is sharp and burning.   Was treated for Chlamydia 2 weeks ago. States her boyfriend went "to his doctor" and got treated. States the girl he had sex with got treated also. Has a call in to our clinic for care, They will call her Monday.    Vaginal Pain The patient's primary symptoms include a genital rash (irritation left labia) and pelvic pain (pubic bone). The patient's pertinent negatives include no genital itching, genital lesions, genital odor, vaginal bleeding or vaginal discharge. This is a new problem. The current episode started in the past 7 days. The problem occurs intermittently. The problem has been unchanged. The pain is moderate. The problem affects both sides. She is pregnant. Pertinent negatives include no abdominal pain, back pain, chills, constipation, diarrhea, dysuria, fever, frequency, headaches, nausea, painful intercourse (boyfirend has been incarcerated for a week) or vomiting. The symptoms are aggravated by tactile pressure. Treatments tried: cortisone cream. The treatment provided no relief. She is sexually active. It is unknown whether or not her partner has an STD. She uses nothing for contraception.  RN Note:  Expand All Collapse All   Patient presents at [redacted] weeks gestation with c/o vaginal and pelvic pain X 2 days. Fetus active. Denies bleeding but states she does have a discharge.           OB History    Gravida Para Term Preterm AB TAB SAB Ectopic Multiple Living   2 1 1       1       Past Medical History  Diagnosis Date  . Heart murmur   . Chlamydia     Past Surgical History  Procedure Laterality Date  . No past surgeries      Family History  Problem Relation Age of Onset  . Asthma Mother     History  Substance Use Topics  . Smoking status: Never Smoker   . Smokeless tobacco: Never Used  . Alcohol Use: No    Allergies:  Allergies  Allergen Reactions  . Kiwi Extract Swelling    Causes swelling of the mouth and throat.  Marland Kitchen Penicillins Hives    Prescriptions prior to admission  Medication Sig Dispense Refill Last Dose  . acetaminophen (TYLENOL) 500 MG tablet Take 500 mg by mouth every 6 (six) hours as needed.   Past Week at Unknown time  . diphenhydrAMINE (BENADRYL) 25 MG tablet Take 1 tablet (25 mg total) by mouth every 6 (six) hours. 20 tablet 0 Past Week at Unknown time  . oxyCODONE-acetaminophen (PERCOCET/ROXICET) 5-325 MG per tablet Take 1-2 tablets by mouth every 6 (six) hours as needed for severe pain. 10 tablet 0 Past Month at Unknown time  . Prenatal Vit-Min-FA-Fish Oil ([facility] PRENATAL GUMMY) 0.4-113.5 MG CHEW Chew 1 tablet by mouth daily.   03/09/2015 at  Unknown time   Medical, Surgical, Family and Social histories reviewed and are listed above.  Medications and allergies reviewed.   Review of Systems  Constitutional: Negative for fever, chills and malaise/fatigue.  Gastrointestinal: Negative for nausea, vomiting, abdominal pain, diarrhea and constipation.  Genitourinary: Positive for vaginal pain and pelvic pain (pubic bone). Negative for dysuria, frequency and vaginal discharge.       Burning to touch on labia, more on left   Musculoskeletal: Negative for myalgias and back pain.  Neurological: Negative for weakness and headaches.  Other systems negative  Physical Exam   Blood pressure 104/56, pulse 103, temperature 98.7 F (37.1 C),  temperature source Oral, resp. rate 16, height  (1.626 m), weight 128 lb (58.06 kg), last menstrual period 10/11/2014, unknown if currently breastfeeding.  Physical Exam  Constitutional: She is oriented to person, place, and time. She appears well-developed and well-nourished.  HENT:  Head: Normocephalic.  Cardiovascular: Normal rate and regular rhythm.   Respiratory: Effort normal. No respiratory distress.  GI: Soft. She exhibits no distension and no mass. There is no tenderness. There is no rebound and no guarding.  Genitourinary: No vaginal discharge found.  Introitus somewhat edematous and mildly erethemic No lesions but there is excoriation left labia Patient would not tolerate digital exam beyond 1cm into introitus  Musculoskeletal: Normal range of motion.  Neurological: She is alert and oriented to person, place, and time.  Skin: Skin is warm and dry.  Psychiatric: She has a normal mood and affect.    MAU Course  Procedures  MDM Cannot do TOC for chlamydia yet, has been only 2 weeks Wet prep sent HSV 1, HSV2 glycoproteins ordered CBC diff ordered Results for orders placed or performed during the hospital encounter of 03/09/15 (from the past 24 hour(s))  Urinalysis, Routine w reflex microscopic (not at [facility])     Status: Abnormal   Collection Time: 03/09/15 11:30 AM  Result Value Ref Range   Color, Urine YELLOW YELLOW   APPearance CLEAR CLEAR   Specific Gravity, Urine 1.025 1.005 - 1.030   pH 6.0 5.0 - 8.0   Glucose, UA NEGATIVE NEGATIVE mg/dL   Hgb urine dipstick NEGATIVE NEGATIVE   Bilirubin Urine NEGATIVE NEGATIVE   Ketones, ur NEGATIVE NEGATIVE mg/dL   Protein, ur NEGATIVE NEGATIVE mg/dL   Urobilinogen, UA 0.2 0.0 - 1.0 mg/dL   Nitrite NEGATIVE NEGATIVE   Leukocytes, UA MODERATE (A) NEGATIVE  Urine microscopic-add on     Status: Abnormal   Collection Time: 03/09/15 11:30 AM  Result Value Ref Range   Squamous Epithelial / LPF MANY (A) RARE   WBC, UA 7-10 <3  WBC/hpf   RBC / HPF 0-2 <3 RBC/hpf   Bacteria, UA MANY (A) RARE   Urine-Other FEW YEAST   Wet prep, genital     Status: Abnormal   Collection Time: 03/09/15  1:26 PM  Result Value Ref Range   Yeast Wet Prep HPF POC FEW (A) NONE SEEN   Trich, Wet Prep NONE SEEN NONE SEEN   Clue Cells Wet Prep HPF POC NONE SEEN NONE SEEN   WBC, Wet Prep HPF POC FEW (A) NONE SEEN  CBC with Differential     Status: Abnormal (Preliminary result)   Collection Time: 03/09/15  2:25 PM  Result Value Ref Range   WBC 4.3 4.0 - 10.5 K/uL   RBC 3.36 (L) 3.87 - 5.11 MIL/uL   Hemoglobin 11.6 (L) 12.0 - 15.0 g/dL   HCT 16.1 (L) 09.6 -  46.0 %   MCV 101.5 (H) 78.0 - 100.0 fL   MCH 34.5 (H) 26.0 - 34.0 pg   MCHC 34.0 30.0 - 36.0 g/dL   RDW 16.1 09.6 - 04.5 %   Platelets 258 150 - 400 K/uL   Neutrophils Relative % 60 43 - 77 %   Neutro Abs 2.6 1.7 - 7.7 K/uL   Lymphocytes Relative 33 12 - 46 %   Lymphs Abs 1.4 0.7 - 4.0 K/uL   Monocytes Relative 6 3 - 12 %   Monocytes Absolute 0.3 0.1 - 1.0 K/uL   Eosinophils Relative 1 0 - 5 %   Eosinophils Absolute 0.0 0.0 - 0.7 K/uL   Basophils Relative 0 0 - 1 %   Basophils Absolute 0.0 0.0 - 0.1 K/uL   Other PENDING %      Assessment and Plan  A:  SIUP at [redacted]w[redacted]d       Vaginal irritation, yeast vaginitis       Pubic bone pain, ongoing       No prenatal care  P: Discharge home      Discussed findings       Rx Diflucan po x 1, advised it may be a few days before  She notices relief      HSV glycoproteins pending      Pt to call clinic Monday for new ob appointment  [facility] 03/09/2015, 1:18 PM

## 2015-03-09 NOTE — MAU Note (Signed)
Patient presents at [redacted] weeks gestation with c/o vaginal and pelvic pain X 2 days. Fetus active. Denies bleeding but states she does have a discharge.

## 2015-03-10 LAB — HSV 1 ANTIBODY, IGG: HSV 1 Glycoprotein G Ab, IgG: <0.91 index (ref 0.00–0.90)

## 2015-03-10 LAB — HSV 2 ANTIBODY, IGG: HSV 2 Glycoprotein G Ab, IgG: 7.07 index — ABNORMAL HIGH (ref 0.00–0.90)

## 2015-03-12 DIAGNOSIS — B009 Herpesviral infection, unspecified: Secondary | ICD-10-CM | POA: Diagnosis present

## 2015-03-13 ENCOUNTER — Telehealth: Payer: Self-pay | Admitting: *Deleted

## 2015-03-13 NOTE — Telephone Encounter (Signed)
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 Mayford Knife, CNM.  Attempted to contact patient, no answer, left message for patient to contact clinic.

## 2015-03-14 ENCOUNTER — Encounter: Payer: Self-pay | Admitting: *Deleted

## 2015-03-14 NOTE — Telephone Encounter (Signed)
Attempted to contact patient, no answer, left voicemail that we are attempting to contact you, please call the clinic.  Will send certified letter.

## 2015-04-10 ENCOUNTER — Encounter (HOSPITAL_COMMUNITY): Payer: Self-pay | Admitting: *Deleted

## 2015-04-10 ENCOUNTER — Inpatient Hospital Stay (HOSPITAL_COMMUNITY)
Admission: AD | Admit: 2015-04-10 | Discharge: 2015-04-10 | Disposition: A | Discharge status: Home / Self Care | Payer: Self-pay | Source: Ambulatory Visit | Attending: Family Medicine | Admitting: Family Medicine

## 2015-04-10 DIAGNOSIS — O0930 Supervision of pregnancy with insufficient antenatal care, unspecified trimester: Secondary | ICD-10-CM

## 2015-04-10 DIAGNOSIS — O0932 Supervision of pregnancy with insufficient antenatal care, second trimester: Secondary | ICD-10-CM

## 2015-04-10 DIAGNOSIS — R102 Pelvic and perineal pain: Secondary | ICD-10-CM

## 2015-04-10 DIAGNOSIS — O9989 Other specified diseases and conditions complicating pregnancy, childbirth and the puerperium: Secondary | ICD-10-CM | POA: Insufficient documentation

## 2015-04-10 DIAGNOSIS — Z3A24 24 weeks gestation of pregnancy: Secondary | ICD-10-CM | POA: Insufficient documentation

## 2015-04-10 DIAGNOSIS — O26899 Other specified pregnancy related conditions, unspecified trimester: Secondary | ICD-10-CM

## 2015-04-10 DIAGNOSIS — Z880 Allergy status to penicillin: Secondary | ICD-10-CM | POA: Insufficient documentation

## 2015-04-10 DIAGNOSIS — N739 Female pelvic inflammatory disease, unspecified: Secondary | ICD-10-CM

## 2015-04-10 DIAGNOSIS — R109 Unspecified abdominal pain: Secondary | ICD-10-CM

## 2015-04-10 LAB — URINALYSIS, ROUTINE W REFLEX MICROSCOPIC
Bilirubin Urine: NEGATIVE
Glucose, UA: NEGATIVE mg/dL
HGB URINE DIPSTICK: NEGATIVE
Ketones, ur: NEGATIVE mg/dL
LEUKOCYTES UA: NEGATIVE
Nitrite: NEGATIVE
PH: 7 (ref 5.0–8.0)
Protein, ur: NEGATIVE mg/dL
Specific Gravity, Urine: 1.02 (ref 1.005–1.030)
Urobilinogen, UA: 1 mg/dL (ref 0.0–1.0)

## 2015-04-10 LAB — WET PREP, GENITAL
CLUE CELLS WET PREP: NONE SEEN
Trich, Wet Prep: NONE SEEN
Yeast Wet Prep HPF POC: NONE SEEN

## 2015-04-10 MED ORDER — AZITHROMYCIN 250 MG PO TABS
1000.0000 mg | ORAL_TABLET | Freq: Once | ORAL | Status: AC
  Administered 2015-04-10: 1000 mg via ORAL
  Filled 2015-04-10: qty 4

## 2015-04-10 MED ORDER — AZITHROMYCIN 250 MG PO TABS: 1000.0000 mg | ORAL_TABLET | Freq: Once | ORAL | Status: DC

## 2015-04-10 MED ORDER — ONDANSETRON 8 MG PO TBDP
8.0000 mg | ORAL_TABLET | ORAL | Status: AC
  Administered 2015-04-10: 8 mg via ORAL
  Filled 2015-04-10: qty 1

## 2015-04-10 MED ORDER — GI COCKTAIL ~~LOC~~
30.0000 mL | Freq: Once | ORAL | Status: AC
  Administered 2015-04-10: 30 mL via ORAL
  Filled 2015-04-10: qty 30

## 2015-04-10 MED ORDER — AZITHROMYCIN 500 MG PO TABS: ORAL_TABLET | ORAL | Status: DC

## 2015-04-10 MED ORDER — CEFTRIAXONE SODIUM 250 MG IJ SOLR
250.0000 mg | Freq: Once | INTRAMUSCULAR | Status: AC
  Administered 2015-04-10: 250 mg via INTRAMUSCULAR
  Filled 2015-04-10: qty 250

## 2015-04-10 NOTE — MAU Provider Note (Deleted)
Duplicate note

## 2015-04-10 NOTE — MAU Note (Signed)
Dr. Redmond Baseman given report re: urine and wet prep results. Dr. Redmond Baseman to be inserted in the charting where Dr. Natale Milch was charted as Dr. Redmond Baseman is on for faculty.

## 2015-04-10 NOTE — MAU Note (Signed)
Facvulty coming to unit to assess patient.

## 2015-04-10 NOTE — MAU Note (Signed)
Having real bad stomach pains and pain in pelvis.  No hx of prenatal care.

## 2015-04-10 NOTE — MAU Provider Note (Signed)
MAU HISTORY AND PHYSICAL  Chief Complaint:  No chief complaint on file.   Brandy [name] is a 19 y.o.  G2P1001 with IUP at [redacted]w[redacted]d presenting for lower abdominal pain and cramping for 1.5 weeks. Patient has not established prenatal care yet, but first visit is scheduled for tomorrow. Patient had a positive chlamydia and treated with azithromycin.  Patient had positive gonorrhea on 02/21/15 and does not appear to have been treated.  Patient states she has been having  no contractions, no vaginal bleeding, no LOF, with active fetal movement.  Patient denies being sexually active since finding out she had chlamydia.   Pt reports she was running a few days ago while being chased by a dog and jumped over a small hole/ditch. Her pain became worse after this incident. She reports hx in first pregnancy of "high blood antibodies" so she was watched closely in [place] by her prenatal provider. She also had abdominal cramping and pain during this pregnancy but had NSVD at term.  Abdominal Pain This is a recurrent problem. The current episode started 1 to 4 weeks ago. The onset quality is gradual. The problem occurs intermittently. The problem has been waxing and waning. The pain is located in the LLQ. The pain is severe. The quality of the pain is cramping and sharp. The abdominal pain does not radiate. Associated symptoms include nausea. Pertinent negatives include no constipation, diarrhea, dysuria, fever, frequency, headaches or vomiting. The pain is aggravated by movement. She has tried acetaminophen for the symptoms. The treatment provided no relief.   Menstrual History: OB History    Gravida Para Term Preterm AB TAB SAB Ectopic Multiple Living   2 1 1       1       Patient's last menstrual period was 10/11/2014.      Past Medical History  Diagnosis Date  . Heart murmur   . Chlamydia     Past Surgical History  Procedure Laterality Date  . No past surgeries      Family History  Problem  Relation Age of Onset  . Asthma Mother     Social History  Substance Use Topics  . Smoking status: Never Smoker   . Smokeless tobacco: Never Used  . Alcohol Use: No      Allergies  Allergen Reactions  . Kiwi Extract Swelling    Causes swelling of the mouth and throat.  Marland Kitchen Penicillins Hives    Prescriptions prior to admission  Medication Sig Dispense Refill Last Dose  . acetaminophen (TYLENOL) 500 MG tablet Take 500 mg by mouth every 6 (six) hours as needed for moderate pain.    04/09/2015 at Unknown time  . Prenatal Vit-Min-FA-Fish Oil ([facility] PRENATAL GUMMY) 0.4-113.5 MG CHEW Chew 1 tablet by mouth daily.   04/10/2015 at Unknown time    Review of Systems  Constitutional: Negative for fever and chills.  Respiratory: Negative for shortness of breath.   Cardiovascular: Negative for chest pain.  Gastrointestinal: Positive for nausea and abdominal pain. Negative for vomiting, diarrhea and constipation.  Genitourinary: Negative for dysuria and frequency.  Neurological: Negative for dizziness and headaches.  All other systems reviewed and are negative.   Physical Exam  Blood pressure 108/60, pulse 104, temperature 98.6 F (37 C), temperature source Oral, resp. rate 18, weight 60.601 kg (133 lb 9.6 oz), last menstrual period 10/11/2014, unknown if currently breastfeeding. GENERAL: Well-developed, well-nourished female in no acute distress.  LUNGS: Clear to auscultation bilaterally.  HEART: Regular rate and rhythm.  ABDOMEN: Soft, suprapubic tenderness, nondistended, gravid.  EXTREMITIES: Nontender, no edema, 2+ distal pulses. Cervical Exam: Dilatation 0cm   Effacement 0%   Station floating ; exquisite pain on bimanual exam of cervix Presentation: not determined FHT:  Baseline rate 130 bpm   Variability moderate  Accelerations present   Decelerations none Contractions: None   Labs: Results for orders placed or performed during the hospital encounter of 04/10/15 (from the past 24  hour(s))  Urinalysis, Routine w reflex microscopic (not at [facility])   Collection Time: 04/10/15 11:00 AM  Result Value Ref Range   Color, Urine YELLOW YELLOW   APPearance CLEAR CLEAR   Specific Gravity, Urine 1.020 1.005 - 1.030   pH 7.0 5.0 - 8.0   Glucose, UA NEGATIVE NEGATIVE mg/dL   Hgb urine dipstick NEGATIVE NEGATIVE   Bilirubin Urine NEGATIVE NEGATIVE   Ketones, ur NEGATIVE NEGATIVE mg/dL   Protein, ur NEGATIVE NEGATIVE mg/dL   Urobilinogen, UA 1.0 0.0 - 1.0 mg/dL   Nitrite NEGATIVE NEGATIVE   Leukocytes, UA NEGATIVE NEGATIVE  Wet prep, genital   Collection Time: 04/10/15 12:06 PM  Result Value Ref Range   Yeast Wet Prep HPF POC NONE SEEN NONE SEEN   Trich, Wet Prep NONE SEEN NONE SEEN   Clue Cells Wet Prep HPF POC NONE SEEN NONE SEEN   WBC, Wet Prep HPF POC FEW (A) NONE SEEN    Imaging Studies:  No results found.  Assessment: Brandy [name] is  19 y.o. G2P1001 at [redacted]w[redacted]d presents with lower abdominal pain and cramping. 1. PID (pelvic inflammatory disease)   2. Abdominal pain affecting pregnancy   3. Late prenatal care affecting pregnancy   4. Late prenatal care affecting pregnancy in second trimester, antepartum   5. Vaginal pain     Plan: Wet prep with no signs of trich. Gc/Chlamydia vaginal culture obtained Will treat empirically for PID. Rocephin  IM x 1 Azithromycin 1g PO today and 2nd dose in 1 week. Will need f/u in clinic asap. Needs TOC in 4 weeks. LEFTWICH-KIRBY, Eliah Marquard 9/7/20165:15 PM   Assumed care from Dr Redmond Baseman  MDM: Rocephin and azithromycin doses given.  Pt reported nausea and vomited x1 after medication but only clear light emesis without evidence of tablets given previously.  Then, pt reported chest pain described as sharp mid-sternal pain and upper abdominal sharp pain.  EKG ordered and results reviewed. Normal EKG.  GI cocktail given with Zofran 8 mg ODT.  Chest pain and nausea resolved.  Pt stable at time of discharge.  Teaching done  about round ligament pain, recommend rest/ice/heat/Tylenol for pain.  D/C home Azithromycin 1000 mg PO x 1 dose in 1 week F/U with appointment in [facility] tomorrow Return to MAU as needed for emergencies  LEFTWICH-KIRBY, Meia Emley, CNM 5:15 PM

## 2015-04-10 NOTE — MAU Note (Signed)
Pt c/o upper abdominal pain with a small amount of fluid vomited up while nurse was out of room. L Leftwich Kirby CNM given report and pt to receive a GI cocktail and then be assessed.

## 2015-04-10 NOTE — Discharge Instructions (Signed)
Pelvic Inflammatory Disease °Pelvic inflammatory disease (PID) refers to an infection in some or all of the female organs. The infection can be in the uterus, ovaries, fallopian tubes, or the surrounding tissues in the pelvis. PID can cause abdominal or pelvic pain that comes on suddenly (acute pelvic pain). PID is a serious infection because it can lead to lasting (chronic) pelvic pain or the inability to have children (infertile).  °CAUSES  °The infection is often caused by the normal bacteria found in the vaginal tissues. PID may also be caused by an infection that is spread during sexual contact. PID can also occur following:  °· The birth of a baby.   °· A miscarriage.   °· An abortion.   °· Major pelvic surgery.   °· The use of an intrauterine device (IUD).   °· A sexual assault.   °RISK FACTORS °Certain factors can put a person at higher risk for PID, such as: °· Being younger than 25 years. °· Being sexually active at a young age. °· Using nonbarrier contraception. °· Having multiple sexual partners. °· Having sex with someone who has symptoms of a genital infection. °· Using oral contraception. °Other times, certain behaviors can increase the possibility of getting PID, such as: °· Having sex during your period. °· Using a vaginal douche. °· Having an intrauterine device (IUD) in place. °SYMPTOMS  °· Abdominal or pelvic pain.   °· Fever.   °· Chills.   °· Abnormal vaginal discharge. °· Abnormal uterine bleeding.   °· Unusual pain shortly after finishing your period. °DIAGNOSIS  °Your caregiver will choose some of the following methods to make a diagnosis, such as:  °· Performing a physical exam and history. A pelvic exam typically reveals a very tender uterus and surrounding pelvis.   °· Ordering laboratory tests including a pregnancy test, blood tests, and urine test.  °· Ordering cultures of the vagina and cervix to check for a sexually transmitted infection (STI). °· Performing an ultrasound.    °· Performing a laparoscopic procedure to look inside the pelvis.   °TREATMENT  °· Antibiotic medicines may be prescribed and taken by mouth.   °· Sexual partners may be treated when the infection is caused by a sexually transmitted disease (STD).   °· Hospitalization may be needed to give antibiotics intravenously. °· Surgery may be needed, but this is rare. °It may take weeks until you are completely well. If you are diagnosed with PID, you should also be checked for human immunodeficiency virus (HIV).   °HOME CARE INSTRUCTIONS  °· If given, take your antibiotics as directed. Finish the medicine even if you start to feel better.   °· Only take over-the-counter or prescription medicines for pain, discomfort, or fever as directed by your caregiver.   °· Do not have sexual intercourse until treatment is completed or as directed by your caregiver. If PID is confirmed, your recent sexual partner(s) will need treatment.   °· Keep your follow-up appointments. °SEEK MEDICAL CARE IF:  °· You have increased or abnormal vaginal discharge.   °· You need prescription medicine for your pain.   °· You vomit.   °· You cannot take your medicines.   °· Your partner has an STD.   °SEEK IMMEDIATE MEDICAL CARE IF:  °· You have a fever.   °· You have increased abdominal or pelvic pain.   °· You have chills.   °· You have pain when you urinate.   °· You are not better after 72 hours following treatment.   °MAKE SURE YOU:  °· Understand these instructions. °· Will watch your condition. °· Will get help right away if you are not doing well or get worse. °  Document Released: 07/20/2005 Document Revised: 11/14/2012 Document Reviewed: 07/16/2011 [facility] Patient Information 2015 [place], [place]. This information is not intended to replace advice given to you by your health care provider. Make sure you discuss any questions you have with your health care provider.   Abdominal Pain During Pregnancy Abdominal pain is common in pregnancy.  Most of the time, it does not cause harm. There are many causes of abdominal pain. Some causes are more serious than others. Some of the causes of abdominal pain in pregnancy are easily diagnosed. Occasionally, the diagnosis takes time to understand. Other times, the cause is not determined. Abdominal pain can be a sign that something is very wrong with the pregnancy, or the pain may have nothing to do with the pregnancy at all. For this reason, always tell your health care provider if you have any abdominal discomfort. HOME CARE INSTRUCTIONS  Monitor your abdominal pain for any changes. The following actions may help to alleviate any discomfort you are experiencing:  Do not have sexual intercourse or put anything in your vagina until your symptoms go away completely.  Get plenty of rest until your pain improves.  Drink clear fluids if you feel nauseous. Avoid solid food as long as you are uncomfortable or nauseous.  Only take over-the-counter or prescription medicine as directed by your health care provider.  Keep all follow-up appointments with your health care provider. SEEK IMMEDIATE MEDICAL CARE IF:  You are bleeding, leaking fluid, or passing tissue from the vagina.  You have increasing pain or cramping.  You have persistent vomiting.  You have painful or bloody urination.  You have a fever.  You notice a decrease in your baby's movements.  You have extreme weakness or feel faint.  You have shortness of breath, with or without abdominal pain.  You develop a severe headache with abdominal pain.  You have abnormal vaginal discharge with abdominal pain.  You have persistent diarrhea.  You have abdominal pain that continues even after rest, or gets worse. MAKE SURE YOU:   Understand these instructions.  Will watch your condition.  Will get help right away if you are not doing well or get worse. Document Released: 07/20/2005 Document Revised: 05/10/2013 Document  Reviewed: 02/16/2013 [facility] Patient Information 2015 [place], [place]. This information is not intended to replace advice given to you by your health care provider. Make sure you discuss any questions you have with your health care provider.  Round Ligament Pain During Pregnancy  Round ligament pain is a sharp pain or jabbing feeling often felt in the lower belly or groin area on one or both sides. It is one of the most common complaints during pregnancy and is considered a normal part of pregnancy. It is most often felt during the second trimester.  Here is what you need to know about round ligament pain, including some tips to help you feel better.  Causes of Round Ligament Pain  Several thick ligaments surround and support your womb (uterus) as it grows during pregnancy. One of them is called the round ligament.  The round ligament connects the front part of the womb to your groin, the area where your legs attach to your pelvis. The round ligament normally tightens and relaxes slowly.  As your baby and womb grow, the round ligament stretches. That makes it more likely to become strained.  Sudden movements can cause the ligament to tighten quickly, like a rubber band snapping. This causes a sudden and quick jabbing feeling.  Symptoms of Round Ligament Pain  Round ligament pain can be concerning and uncomfortable. But it is considered normal as your body changes during pregnancy.  The symptoms of round ligament pain include a sharp, sudden spasm in the belly. It usually affects the right side, but it may happen on both sides. The pain only lasts a few seconds.  Exercise may cause the pain, as will rapid movements such as:  sneezing coughing laughing rolling over in bed standing up too quickly  Treatment of Round Ligament Pain  Here are some tips that may help reduce your discomfort:  Pain relief. Take over-the-counter acetaminophen for pain, if necessary. Ask your doctor if  this is OK.  Exercise. Get plenty of exercise to keep your stomach (core) muscles strong. Doing stretching exercises or prenatal yoga can be helpful. Ask your doctor which exercises are safe for you and your baby.  A helpful exercise involves putting your hands and knees on the floor, lowering your head, and pushing your backside into the air.  Avoid sudden movements. Change positions slowly (such as standing up or sitting down) to avoid sudden movements that may cause stretching and pain.  Flex your hips. Bend and flex your hips before you cough, sneeze, or laugh to avoid pulling on the ligaments.  Apply warmth. A heating pad or warm bath may be helpful. Ask your doctor if this is OK. Extreme heat can be dangerous to the baby.  You should try to modify your daily activity level and avoid positions that may worsen the condition.  When to Call the Doctor/Midwife  Always tell your doctor or midwife about any type of pain you have during pregnancy. Round ligament pain is quick and doesn't last long.  Call your health care provider immediately if you have:  severe pain fever chills pain on urination difficulty walking  Belly pain during pregnancy can be due to many different causes. It is important for your doctor to rule out more serious conditions, including pregnancy complications such as placenta abruption or non-pregnancy illnesses such as:  inguinal hernia appendicitis stomach, liver, and kidney problems Preterm labor pains may sometimes be mistaken for round ligament pain.

## 2015-04-10 NOTE — MAU Note (Signed)
Pt states that now she is having some chest pain bu that her abdominal pain is not as intense. Pt has called her mother to come and help take care of her child and then drive her home.

## 2015-04-11 ENCOUNTER — Encounter: Payer: Self-pay | Admitting: Obstetrics & Gynecology

## 2015-04-11 LAB — GC/CHLAMYDIA PROBE AMP (~~LOC~~) NOT AT ARMC
Chlamydia: NEGATIVE
NEISSERIA GONORRHEA: NEGATIVE

## 2015-04-18 ENCOUNTER — Ambulatory Visit (INDEPENDENT_AMBULATORY_CARE_PROVIDER_SITE_OTHER): Payer: Self-pay | Admitting: Obstetrics & Gynecology

## 2015-04-18 ENCOUNTER — Encounter: Payer: Self-pay | Admitting: Obstetrics & Gynecology

## 2015-04-18 VITALS — BP 110/63 | HR 80 | Temp 98.6°F | Wt 134.4 lb

## 2015-04-18 DIAGNOSIS — Z118 Encounter for screening for other infectious and parasitic diseases: Secondary | ICD-10-CM

## 2015-04-18 DIAGNOSIS — Z113 Encounter for screening for infections with a predominantly sexual mode of transmission: Secondary | ICD-10-CM

## 2015-04-18 DIAGNOSIS — Z23 Encounter for immunization: Secondary | ICD-10-CM

## 2015-04-18 DIAGNOSIS — Z124 Encounter for screening for malignant neoplasm of cervix: Secondary | ICD-10-CM

## 2015-04-18 DIAGNOSIS — O0932 Supervision of pregnancy with insufficient antenatal care, second trimester: Secondary | ICD-10-CM

## 2015-04-18 LAB — POCT URINALYSIS DIP (DEVICE)
Bilirubin Urine: NEGATIVE
GLUCOSE, UA: NEGATIVE mg/dL
HGB URINE DIPSTICK: NEGATIVE
Ketones, ur: NEGATIVE mg/dL
Leukocytes, UA: NEGATIVE
Nitrite: NEGATIVE
PH: 6.5 (ref 5.0–8.0)
PROTEIN: NEGATIVE mg/dL
SPECIFIC GRAVITY, URINE: 1.025 (ref 1.005–1.030)
UROBILINOGEN UA: 0.2 mg/dL (ref 0.0–1.0)

## 2015-04-18 MED ORDER — TETANUS-DIPHTH-ACELL PERTUSSIS 5-2.5-18.5 LF-MCG/0.5 IM SUSP
0.5000 mL | Freq: Once | INTRAMUSCULAR | Status: AC
  Administered 2015-04-18: 0.5 mL via INTRAMUSCULAR

## 2015-04-18 NOTE — Patient Instructions (Signed)
Third Trimester of Pregnancy The third trimester is from week 29 through week 42, months 7 through 9. The third trimester is a time when the fetus is growing rapidly. At the end of the ninth month, the fetus is about 20 inches in length and weighs 6-10 pounds.  BODY CHANGES Your body goes through many changes during pregnancy. The changes vary from woman to woman.   Your weight will continue to increase. You can expect to gain 25-35 pounds (11-16 kg) by the end of the pregnancy.  You may begin to get stretch marks on your hips, abdomen, and breasts.  You may urinate more often because the fetus is moving lower into your pelvis and pressing on your bladder.  You may develop or continue to have heartburn as a result of your pregnancy.  You may develop constipation because certain hormones are causing the muscles that push waste through your intestines to slow down.  You may develop hemorrhoids or swollen, bulging veins (varicose veins).  You may have pelvic pain because of the weight gain and pregnancy hormones relaxing your joints between the bones in your pelvis. Backaches may result from overexertion of the muscles supporting your posture.  You may have changes in your hair. These can include thickening of your hair, rapid growth, and changes in texture. Some women also have hair loss during or after pregnancy, or hair that feels dry or thin. Your hair will most likely return to normal after your baby is born.  Your breasts will continue to grow and be tender. A yellow discharge may leak from your breasts called colostrum.  Your belly button may stick out.  You may feel short of breath because of your expanding uterus.  You may notice the fetus "dropping," or moving lower in your abdomen.  You may have a bloody mucus discharge. This usually occurs a few days to a week before labor begins.  Your cervix becomes thin and soft (effaced) near your due date. WHAT TO EXPECT AT YOUR PRENATAL  EXAMS  You will have prenatal exams every 2 weeks until week 36. Then, you will have weekly prenatal exams. During a routine prenatal visit:  You will be weighed to make sure you and the fetus are growing normally.  Your blood pressure is taken.  Your abdomen will be measured to track your baby's growth.  The fetal heartbeat will be listened to.  Any test results from the previous visit will be discussed.  You may have a cervical check near your due date to see if you have effaced. At around 36 weeks, your caregiver will check your cervix. At the same time, your caregiver will also perform a test on the secretions of the vaginal tissue. This test is to determine if a type of bacteria, Group B streptococcus, is present. Your caregiver will explain this further. Your caregiver may ask you:  What your birth plan is.  How you are feeling.  If you are feeling the baby move.  If you have had any abnormal symptoms, such as leaking fluid, bleeding, severe headaches, or abdominal cramping.  If you have any questions. Other tests or screenings that may be performed during your third trimester include:  Blood tests that check for low iron levels (anemia).  Fetal testing to check the health, activity level, and growth of the fetus. Testing is done if you have certain medical conditions or if there are problems during the pregnancy. FALSE LABOR You may feel small, irregular contractions that   eventually go away. These are called Braxton Hicks contractions, or false labor. Contractions may last for hours, days, or even weeks before true labor sets in. If contractions come at regular intervals, intensify, or become painful, it is best to be seen by your caregiver.  SIGNS OF LABOR   Menstrual-like cramps.  Contractions that are 5 minutes apart or less.  Contractions that start on the top of the uterus and spread down to the lower abdomen and back.  A sense of increased pelvic pressure or back  pain.  A watery or bloody mucus discharge that comes from the vagina. If you have any of these signs before the 37th week of pregnancy, call your caregiver right away. You need to go to the hospital to get checked immediately. HOME CARE INSTRUCTIONS   Avoid all smoking, herbs, alcohol, and unprescribed drugs. These chemicals affect the formation and growth of the baby.  Follow your caregiver's instructions regarding medicine use. There are medicines that are either safe or unsafe to take during pregnancy.  Exercise only as directed by your caregiver. Experiencing uterine cramps is a good sign to stop exercising.  Continue to eat regular, healthy meals.  Wear a good support bra for breast tenderness.  Do not use hot tubs, steam rooms, or saunas.  Wear your seat belt at all times when driving.  Avoid raw meat, uncooked cheese, cat litter boxes, and soil used by cats. These carry germs that can cause birth defects in the baby.  Take your prenatal vitamins.  Try taking a stool softener (if your caregiver approves) if you develop constipation. Eat more high-fiber foods, such as fresh vegetables or fruit and whole grains. Drink plenty of fluids to keep your urine clear or pale yellow.  Take warm sitz baths to soothe any pain or discomfort caused by hemorrhoids. Use hemorrhoid cream if your caregiver approves.  If you develop varicose veins, wear support hose. Elevate your feet for 15 minutes, 3-4 times a day. Limit salt in your diet.  Avoid heavy lifting, wear low heal shoes, and practice good posture.  Rest a lot with your legs elevated if you have leg cramps or low back pain.  Visit your dentist if you have not gone during your pregnancy. Use a soft toothbrush to brush your teeth and be gentle when you floss.  A sexual relationship may be continued unless your caregiver directs you otherwise.  Do not travel far distances unless it is absolutely necessary and only with the approval  of your caregiver.  Take prenatal classes to understand, practice, and ask questions about the labor and delivery.  Make a trial run to the hospital.  Pack your hospital bag.  Prepare the baby's nursery.  Continue to go to all your prenatal visits as directed by your caregiver. SEEK MEDICAL CARE IF:  You are unsure if you are in labor or if your water has broken.  You have dizziness.  You have mild pelvic cramps, pelvic pressure, or nagging pain in your abdominal area.  You have persistent nausea, vomiting, or diarrhea.  You have a bad smelling vaginal discharge.  You have pain with urination. SEEK IMMEDIATE MEDICAL CARE IF:   You have a fever.  You are leaking fluid from your vagina.  You have spotting or bleeding from your vagina.  You have severe abdominal cramping or pain.  You have rapid weight loss or gain.  You have shortness of breath with chest pain.  You notice sudden or extreme swelling   of your face, hands, ankles, feet, or legs.  You have not felt your baby move in over an hour.  You have severe headaches that do not go away with medicine.  You have vision changes. Document Released: 07/14/2001 Document Revised: 07/25/2013 Document Reviewed: 09/20/2012 [employer] Patient Information 2015 [employer]. This information is not intended to replace advice given to you by your health care provider. Make sure you discuss any questions you have with your health care provider.  

## 2015-04-18 NOTE — Progress Notes (Signed)
Nutrition Note:  1st visit consult Pt seen today for first prenatal visit for current pregnancy. Pt has gained 9.4 # @ [redacted]w[redacted]d, which is expected.  Pt reports eating 3 meals, 3 snacks daily. Pt is taking PNV.  Pt reports no N & V or heartburn. NKFA. Pt received verbal and written education on general nutrition during pregnancy. Discussed weight gain goals of 25-35# or 1 lb per week. Pt agrees to continue to take PNV. Pt does not plan to BF but agreed to think about it.  Pt does not have WIC but plans to apply.  F/u as needed. Carloyn Manner, MS, RD, LDN

## 2015-04-18 NOTE — Progress Notes (Signed)
Ultrasound for anatomy scheduled for 05/02/2015@2 :00PM

## 2015-04-18 NOTE — Progress Notes (Signed)
Breastfeeding tip of the week reviewed Initial prenatal education packet given Prenatal profile, 1ht gtt today TDap today, declines flu Rhogam today PAP, cultures today

## 2015-04-18 NOTE — Progress Notes (Signed)
   Subjective: late to care    Brandy [name] is a G2P1001 [redacted]w[redacted]d being seen today for her first obstetrical visit.  Her obstetrical history is significant for late [facility], Rh neg. Patient does intend to breast feed. Pregnancy history fully reviewed.  Patient reports pressure.  Filed Vitals:   04/18/15 0822  BP: 110/63  Pulse: 80  Temp: 98.6 F (37 C)  Weight: 134 lb 6.4 oz (60.963 kg)    HISTORY: OB History  Gravida Para Term Preterm AB SAB TAB Ectopic Multiple Living  # Outcome Date GA Lbr Len/2nd Weight Sex Delivery Anes PTL Lv  2 Current           1 Term 12/27/13   5 lb 12 oz (2.608 kg) M Vag-Spont EPI N Y     Comments: System Generated. Please review and update pregnancy details.     Past Medical History  Diagnosis Date  . Heart murmur   . Chlamydia    Past Surgical History  Procedure Laterality Date  . No past surgeries     Family History  Problem Relation Age of Onset  . Asthma Mother      Exam    Uterus:     Pelvic Exam:    Perineum: No Hemorrhoids   Vulva: normal   Vagina:  normal mucosa   pH:    Cervix: no lesions and pap done   Adnexa: not evaluated   Bony Pelvis: average  System: Breast:  Inspection negative   Skin: normal coloration and turgor, no rashes    Neurologic: oriented, normal mood   Extremities: normal strength, tone, and muscle mass   HEENT neck supple with midline trachea and thyroid without masses   Mouth/Teeth dental hygiene good   Neck supple   Cardiovascular: regular rate and rhythm, no murmurs or gallops   Respiratory:  appears well, vitals normal, no respiratory distress, acyanotic, normal RR, chest clear, no wheezing, crepitations, rhonchi, normal symmetric air entry   Abdomen: soft, non-tender; bowel sounds normal; no masses,  no organomegaly   Urinary: urethral meatus normal      Assessment:    Pregnancy: G2P1001 Patient Active Problem List   Diagnosis Date Noted  . PID (pelvic inflammatory  disease) 04/10/2015  . 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 Late prenatal care affecting pregnancy in second trimester, antepartum 03/09/2015  . Vaginal pain 03/09/2015  . Abdominal pain affecting pregnancy   . Chlamydia infection affecting pregnancy in second trimester, antepartum 01/28/2015        Plan:     Initial labs drawn. Prenatal vitamins. Problem list reviewed and updated. Genetic Screening discussed missed due to late care  Ultrasound discussed; fetal survey: ordered.  Follow up in 2 weeks. 50% of 30 min visit spent on counseling and coordination of care.  rhogam   Brandy [name] 04/18/2015

## 2015-04-19 LAB — OBSTETRIC PANEL
ANTIBODY SCREEN: POSITIVE — AB
BASOS PCT: 0 % (ref 0–1)
Basophils Absolute: 0 10*3/uL (ref 0.0–0.1)
EOS PCT: 1 % (ref 0–5)
Eosinophils Absolute: 0 10*3/uL (ref 0.0–0.7)
HEMATOCRIT: 33.7 % — AB (ref 36.0–46.0)
Hemoglobin: 11 g/dL — ABNORMAL LOW (ref 12.0–15.0)
Hepatitis B Surface Ag: NEGATIVE
Lymphocytes Relative: 39 % (ref 12–46)
Lymphs Abs: 1.4 10*3/uL (ref 0.7–4.0)
MCH: 33.2 pg (ref 26.0–34.0)
MCHC: 32.6 g/dL (ref 30.0–36.0)
MCV: 101.8 fL — AB (ref 78.0–100.0)
MONO ABS: 0.3 10*3/uL (ref 0.1–1.0)
MPV: 10.4 fL (ref 8.6–12.4)
Monocytes Relative: 9 % (ref 3–12)
Neutro Abs: 1.8 10*3/uL (ref 1.7–7.7)
Neutrophils Relative %: 51 % (ref 43–77)
PLATELETS: 258 10*3/uL (ref 150–400)
RBC: 3.31 MIL/uL — AB (ref 3.87–5.11)
RDW: 12.6 % (ref 11.5–15.5)
RH TYPE: NEGATIVE
Rubella: 4.37 Index — ABNORMAL HIGH (ref <0.90)
WBC: 3.6 10*3/uL — AB (ref 4.0–10.5)

## 2015-04-19 LAB — ANTIBODY TITER (PRENATAL TITER): AB TITER: 8

## 2015-04-19 LAB — URINE CULTURE
Colony Count: NO GROWTH
ORGANISM ID, BACTERIA: NO GROWTH

## 2015-04-19 LAB — GC/CHLAMYDIA PROBE AMP (~~LOC~~) NOT AT ARMC
Chlamydia: NEGATIVE
NEISSERIA GONORRHEA: NEGATIVE

## 2015-04-19 LAB — PRENATAL ANTIBODY IDENTIFICATION

## 2015-04-19 LAB — GLUCOSE TOLERANCE, 1 HOUR (50G) W/O FASTING: Glucose, 1 Hour GTT: 60 mg/dL — ABNORMAL LOW (ref 70–140)

## 2015-04-20 LAB — PRESCRIPTION MONITORING PROFILE (19 PANEL)
Amphetamine/Meth: NEGATIVE ng/mL
BARBITURATE SCREEN, URINE: NEGATIVE ng/mL
Benzodiazepine Screen, Urine: NEGATIVE ng/mL
Buprenorphine, Urine: NEGATIVE ng/mL
CARISOPRODOL, URINE: NEGATIVE ng/mL
COCAINE METABOLITES: NEGATIVE ng/mL
Cannabinoid Scrn, Ur: NEGATIVE ng/mL
Creatinine, Urine: 137.55 mg/dL (ref >20.0)
Fentanyl, Ur: NEGATIVE ng/mL
MDMA URINE: NEGATIVE ng/mL
Meperidine, Ur: NEGATIVE ng/mL
Methadone Screen, Urine: NEGATIVE ng/mL
Methaqualone: NEGATIVE ng/mL
NITRITES URINE, INITIAL: NEGATIVE ug/mL
OPIATE SCREEN, URINE: NEGATIVE ng/mL
OXYCODONE SCRN UR: NEGATIVE ng/mL
PH URINE, INITIAL: 6.6 pH (ref 4.5–8.9)
PROPOXYPHENE: NEGATIVE ng/mL
Phencyclidine, Ur: NEGATIVE ng/mL
TAPENTADOLUR: NEGATIVE ng/mL
TRAMADOL UR: NEGATIVE ng/mL
Zolpidem, Urine: NEGATIVE ng/mL

## 2015-04-22 LAB — HEMOGLOBINOPATHY EVALUATION
HGB A2 QUANT: 2.3 % (ref 2.2–3.2)
HGB F QUANT: 0.3 % (ref 0.0–2.0)
HGB S QUANTITAION: 0 %
Hemoglobin Other: 0 %
Hgb A: 97.4 % (ref 96.8–97.8)

## 2015-04-22 LAB — CYTOLOGY - PAP

## 2015-04-25 ENCOUNTER — Encounter: Payer: Self-pay | Admitting: Obstetrics & Gynecology

## 2015-04-25 ENCOUNTER — Telehealth: Payer: Self-pay | Admitting: *Deleted

## 2015-04-25 DIAGNOSIS — O360190 Maternal care for anti-D [Rh] antibodies, unspecified trimester, not applicable or unspecified: Secondary | ICD-10-CM | POA: Insufficient documentation

## 2015-04-25 DIAGNOSIS — O26899 Other specified pregnancy related conditions, unspecified trimester: Secondary | ICD-10-CM

## 2015-04-25 DIAGNOSIS — O361920 Maternal care for other isoimmunization, second trimester, not applicable or unspecified: Secondary | ICD-10-CM | POA: Insufficient documentation

## 2015-04-25 DIAGNOSIS — Z6791 Unspecified blood type, Rh negative: Secondary | ICD-10-CM | POA: Insufficient documentation

## 2015-04-25 NOTE — Telephone Encounter (Signed)
Called patient per Dr Erin Fulling regarding need for MFM consult for alloimmunization. Appointment was made for 04/30/15 @ 1100. MFM needs old records faxed to them. Patient did not answer phone, voice mail message left, please call the clinic regarding new appointment.

## 2015-04-30 ENCOUNTER — Encounter (HOSPITAL_COMMUNITY): Payer: Self-pay

## 2015-05-02 ENCOUNTER — Ambulatory Visit (HOSPITAL_COMMUNITY)
Admission: RE | Admit: 2015-05-02 | Discharge: 2015-05-02 | Disposition: A | Discharge status: Home / Self Care | Payer: Self-pay | Source: Ambulatory Visit | Attending: Obstetrics & Gynecology | Admitting: Obstetrics & Gynecology

## 2015-05-02 ENCOUNTER — Other Ambulatory Visit: Payer: Self-pay | Admitting: Obstetrics & Gynecology

## 2015-05-02 DIAGNOSIS — O0932 Supervision of pregnancy with insufficient antenatal care, second trimester: Secondary | ICD-10-CM

## 2015-05-02 DIAGNOSIS — Z36 Encounter for antenatal screening of mother: Secondary | ICD-10-CM | POA: Insufficient documentation

## 2015-05-02 DIAGNOSIS — Z3689 Encounter for other specified antenatal screening: Secondary | ICD-10-CM

## 2015-05-02 DIAGNOSIS — Z3A27 27 weeks gestation of pregnancy: Secondary | ICD-10-CM

## 2015-05-02 DIAGNOSIS — O360131 Maternal care for anti-D [Rh] antibodies, third trimester, fetus 1: Secondary | ICD-10-CM

## 2015-05-02 DIAGNOSIS — O360120 Maternal care for anti-D [Rh] antibodies, second trimester, not applicable or unspecified: Secondary | ICD-10-CM | POA: Insufficient documentation

## 2015-05-02 DIAGNOSIS — O360130 Maternal care for anti-D [Rh] antibodies, third trimester, not applicable or unspecified: Secondary | ICD-10-CM

## 2015-05-02 NOTE — Progress Notes (Signed)
Brandy [name]  was seen today for an ultrasound appointment.  See full report in [facility].  Comments: 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  She had one previous pregnancy.  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  The level of anti-D is currently below the critical threshold, and would recommend montly anti-D titers.  If her titers return > 16, she will need further evaluation and weekly MCA Doppler studies with MFM.  Impression: Single IUP at 27w 6d Late prenatal care, anti-D alloimmunizaton The estimated fetal weight today is at the 33rd %tile.  The AC measures at the 10th %tile. MCA Doppler studies - PSV at 1.14 MoM (low risk for fetal anemia) No evidence of fetal hydrops Posterior placenta without previa Normal amniotic fluid volume  Recommendations: See comments above Ultrasound for growth in 3 weeks  Alpha Gula, MD

## 2015-05-09 ENCOUNTER — Ambulatory Visit (INDEPENDENT_AMBULATORY_CARE_PROVIDER_SITE_OTHER): Payer: Self-pay | Admitting: Obstetrics & Gynecology

## 2015-05-09 VITALS — BP 110/60 | HR 78 | Temp 98.3°F | Wt 142.0 lb

## 2015-05-09 DIAGNOSIS — A5602 Chlamydial vulvovaginitis: Secondary | ICD-10-CM

## 2015-05-09 DIAGNOSIS — A749 Chlamydial infection, unspecified: Secondary | ICD-10-CM

## 2015-05-09 DIAGNOSIS — O98313 Other infections with a predominantly sexual mode of transmission complicating pregnancy, third trimester: Secondary | ICD-10-CM

## 2015-05-09 DIAGNOSIS — O360130 Maternal care for anti-D [Rh] antibodies, third trimester, not applicable or unspecified: Secondary | ICD-10-CM

## 2015-05-09 DIAGNOSIS — O0933 Supervision of pregnancy with insufficient antenatal care, third trimester: Secondary | ICD-10-CM

## 2015-05-09 DIAGNOSIS — O98812 Other maternal infectious and parasitic diseases complicating pregnancy, second trimester: Principal | ICD-10-CM

## 2015-05-09 DIAGNOSIS — O0932 Supervision of pregnancy with insufficient antenatal care, second trimester: Secondary | ICD-10-CM

## 2015-05-09 LAB — POCT URINALYSIS DIP (DEVICE)
Bilirubin Urine: NEGATIVE
Glucose, UA: NEGATIVE mg/dL
KETONES UR: NEGATIVE mg/dL
Nitrite: NEGATIVE
PROTEIN: NEGATIVE mg/dL
SPECIFIC GRAVITY, URINE: 1.02 (ref 1.005–1.030)
UROBILINOGEN UA: 0.2 mg/dL (ref 0.0–1.0)
pH: 7 (ref 5.0–8.0)

## 2015-05-09 NOTE — Addendum Note (Signed)
Addended by: Aryella Besecker L on: 05/09/2015 03:41 PM   Modules accepted: Orders  

## 2015-05-09 NOTE — Addendum Note (Signed)
Addended by: Aldona Lento on: 05/09/2015 03:41 PM   Modules accepted: Orders

## 2015-05-09 NOTE — Addendum Note (Signed)
Addended by: Faythe Casa on: 05/09/2015 03:41 PM   Modules accepted: Orders

## 2015-05-09 NOTE — Progress Notes (Signed)
Pt reports pain on "entire stomach"

## 2015-05-09 NOTE — Patient Instructions (Signed)
Levonorgestrel intrauterine device (IUD) What is this medicine? LEVONORGESTREL IUD (LEE voe nor jes trel) is a contraceptive (birth control) device. The device is placed inside the uterus by a healthcare professional. It is used to prevent pregnancy and can also be used to treat heavy bleeding that occurs during your period. Depending on the device, it can be used for 3 to 5 years. This medicine may be used for other purposes; ask your health care provider or pharmacist if you have questions. What should I tell my health care provider before I take this medicine? They need to know if you have any of these conditions: -abnormal Pap smear -cancer of the breast, uterus, or cervix -diabetes -endometritis -genital or pelvic infection now or in the past -have more than one sexual partner or your partner has more than one partner -heart disease -history of an ectopic or tubal pregnancy -immune system problems -IUD in place -liver disease or tumor -problems with blood clots or take blood-thinners -use intravenous drugs -uterus of unusual shape -vaginal bleeding that has not been explained -an unusual or allergic reaction to levonorgestrel, other hormones, silicone, or polyethylene, medicines, foods, dyes, or preservatives -pregnant or trying to get pregnant -breast-feeding How should I use this medicine? This device is placed inside the uterus by a health care professional. Talk to your pediatrician regarding the use of this medicine in children. Special care may be needed. Overdosage: If you think you have taken too much of this medicine contact a poison control center or emergency room at once. NOTE: This medicine is only for you. Do not share this medicine with others. What if I miss a dose? This does not apply. What may interact with this medicine? Do not take this medicine with any of the following medications: -amprenavir -bosentan -fosamprenavir This medicine may also interact with  the following medications: -aprepitant -barbiturate medicines for inducing sleep or treating seizures -bexarotene -griseofulvin -medicines to treat seizures like carbamazepine, ethotoin, felbamate, oxcarbazepine, phenytoin, topiramate -modafinil -pioglitazone -rifabutin -rifampin -rifapentine -some medicines to treat HIV infection like atazanavir, indinavir, lopinavir, nelfinavir, tipranavir, ritonavir -St. John's wort -warfarin This list may not describe all possible interactions. Give your health care provider a list of all the medicines, herbs, non-prescription drugs, or dietary supplements you use. Also tell them if you smoke, drink alcohol, or use illegal drugs. Some items may interact with your medicine. What should I watch for while using this medicine? Visit your doctor or health care professional for regular check ups. See your doctor if you or your partner has sexual contact with others, becomes HIV positive, or gets a sexual transmitted disease. This product does not protect you against HIV infection (AIDS) or other sexually transmitted diseases. You can check the placement of the IUD yourself by reaching up to the top of your vagina with clean fingers to feel the threads. Do not pull on the threads. It is a good habit to check placement after each menstrual period. Call your doctor right away if you feel more of the IUD than just the threads or if you cannot feel the threads at all. The IUD may come out by itself. You may become pregnant if the device comes out. If you notice that the IUD has come out use a backup birth control method like condoms and call your health care provider. Using tampons will not change the position of the IUD and are okay to use during your period. What side effects may I notice from receiving this medicine?   Side effects that you should report to your doctor or health care professional as soon as possible: -allergic reactions like skin rash, itching or  hives, swelling of the face, lips, or tongue -fever, flu-like symptoms -genital sores -high blood pressure -no menstrual period for 6 weeks during use -pain, swelling, warmth in the leg -pelvic pain or tenderness -severe or sudden headache -signs of pregnancy -stomach cramping -sudden shortness of breath -trouble with balance, talking, or walking -unusual vaginal bleeding, discharge -yellowing of the eyes or skin Side effects that usually do not require medical attention (report to your doctor or health care professional if they continue or are bothersome): -acne -breast pain -change in sex drive or performance -changes in weight -cramping, dizziness, or faintness while the device is being inserted -headache -irregular menstrual bleeding within first 3 to 6 months of use -nausea This list may not describe all possible side effects. Call your doctor for medical advice about side effects. You may report side effects to FDA at 1-800-FDA-1088. Where should I keep my medicine? This does not apply. NOTE: This sheet is a summary. It may not cover all possible information. If you have questions about this medicine, talk to your doctor, pharmacist, or health care provider.    2016, Elsevier/Gold Standard. (2011-08-20 13:54:04)  

## 2015-05-09 NOTE — Progress Notes (Signed)
Subjective:  Brandy [name] is a 19 y.o. G2P1001 at [redacted]w[redacted]d being seen today for ongoing prenatal care.  Patient reports no complaints to MD.  Contractions: Irritability.  Vag. Bleeding: None. Movement: Present. Denies leaking of fluid.   The following portions of the patient's history were reviewed and updated as appropriate: allergies, current medications, past family history, past medical history, past social history, past surgical history and problem list.   Objective:   Filed Vitals:   05/09/15 0841  BP: 110/60  Pulse: 78  Temp: 98.3 F (36.8 C)  Weight: 142 lb (64.411 kg)    Fetal Status: Fetal Heart Rate (bpm): 134 Fundal Height: 25 cm Movement: Present     General:  Alert, oriented and cooperative. Patient is in no acute distress.  Skin: Skin is warm and dry. No rash noted.   Cardiovascular: Normal heart rate noted  Respiratory: Normal respiratory effort, no problems with respiration noted  Abdomen: Soft, gravid, appropriate for gestational age. Pain/Pressure: Present     Pelvic: Vag. Bleeding: None     Cervical exam deferred        Extremities: Normal range of motion.  Edema: None  Mental Status: Normal mood and affect. Normal behavior. Normal judgment and thought content.   Urinalysis:    Not back yet.  Assessment and Plan:  Pregnancy: G2P1001 at [redacted]w[redacted]d  1. Chlamydia infection affecting pregnancy in second trimester, antepartum TOC negative  2.  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 Please refer to After Visit Summary for other counseling recommendations.  Return in about 2 weeks (around 05/23/2015).   Lesly Dukes, MD

## 2015-05-10 LAB — PRENATAL ANTIBODY IDENTIFICATION

## 2015-05-10 LAB — ANTIBODY TITER (PRENATAL TITER): AB TITER: 8

## 2015-05-10 LAB — ANTIBODY SCREEN: Antibody Screen: POSITIVE — AB

## 2015-05-23 ENCOUNTER — Ambulatory Visit (INDEPENDENT_AMBULATORY_CARE_PROVIDER_SITE_OTHER): Payer: Medicaid Other | Admitting: Obstetrics & Gynecology

## 2015-05-23 ENCOUNTER — Ambulatory Visit (HOSPITAL_COMMUNITY)
Admission: RE | Admit: 2015-05-23 | Discharge: 2015-05-23 | Disposition: A | Discharge status: Home / Self Care | Payer: Medicaid Other | Source: Ambulatory Visit | Attending: Obstetrics & Gynecology | Admitting: Obstetrics & Gynecology

## 2015-05-23 ENCOUNTER — Encounter (HOSPITAL_COMMUNITY): Payer: Self-pay

## 2015-05-23 VITALS — BP 127/62 | HR 87 | Temp 98.7°F | Wt 146.7 lb

## 2015-05-23 DIAGNOSIS — O0933 Supervision of pregnancy with insufficient antenatal care, third trimester: Secondary | ICD-10-CM

## 2015-05-23 DIAGNOSIS — O360190 Maternal care for anti-D [Rh] antibodies, unspecified trimester, not applicable or unspecified: Secondary | ICD-10-CM

## 2015-05-23 DIAGNOSIS — O360130 Maternal care for anti-D [Rh] antibodies, third trimester, not applicable or unspecified: Secondary | ICD-10-CM

## 2015-05-23 DIAGNOSIS — Z3483 Encounter for supervision of other normal pregnancy, third trimester: Secondary | ICD-10-CM

## 2015-05-23 DIAGNOSIS — Z2913 Encounter for prophylactic Rho(D) immune globulin: Secondary | ICD-10-CM

## 2015-05-23 LAB — POCT URINALYSIS DIP (DEVICE)
Bilirubin Urine: NEGATIVE
Glucose, UA: NEGATIVE mg/dL
HGB URINE DIPSTICK: NEGATIVE
Ketones, ur: NEGATIVE mg/dL
NITRITE: NEGATIVE
PH: 7 (ref 5.0–8.0)
Protein, ur: NEGATIVE mg/dL
Specific Gravity, Urine: 1.02 (ref 1.005–1.030)
UROBILINOGEN UA: 0.2 mg/dL (ref 0.0–1.0)

## 2015-05-23 MED ORDER — RHO D IMMUNE GLOBULIN 1500 UNIT/2ML IJ SOSY
300.0000 ug | PREFILLED_SYRINGE | Freq: Once | INTRAMUSCULAR | Status: AC
  Administered 2015-05-23: 300 ug via INTRAMUSCULAR

## 2015-05-23 NOTE — Progress Notes (Signed)
Breastfeeding tip of the week reviewed Rhogam today

## 2015-05-23 NOTE — Progress Notes (Signed)
Was given rhogam today  to see [facility]FC  Subjective:  Brandy [name] is a 19 y.o. G2P1001 at 4577w6d being seen today for ongoing prenatal care.  Patient reports backache and occasional contractions.  Contractions: Irregular.  Vag. Bleeding: None. Movement: Present. Denies leaking of fluid.   The following portions of the patient's history were reviewed and updated as appropriate: allergies, current medications, past family history, past medical history, past social history, past surgical history and problem list. Problem list updated.  Objective:   Filed Vitals:   05/23/15 1146  BP: 127/62  Pulse: 87  Temp: 98.7 F (37.1 C)  Weight: 146 lb 11.2 oz (66.543 kg)    Fetal Status: Fetal Heart Rate (bpm): 150   Movement: Present     General:  Alert, oriented and cooperative. Patient is in no acute distress.  Skin: Skin is warm and dry. No rash noted.   Cardiovascular: Normal heart rate noted  Respiratory: Normal respiratory effort, no problems with respiration noted  Abdomen: Soft, gravid, appropriate for gestational age. Pain/Pressure: Present     Pelvic: Vag. Bleeding: None Vag D/C Character: White   Cervical exam deferred        Extremities: Normal range of motion.  Edema: None  Mental Status: Normal mood and affect. Normal behavior. Normal judgment and thought content.   Urinalysis: Urine Protein: Negative Urine Glucose: Negative  Assessment and Plan:  Pregnancy: G2P1001 at 8777w6d  1. 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  2. Anti-D antibodies present during pregnancy, unspecified trimester, not applicable or unspecified fetus Titer 8, nl doppler  Preterm labor symptoms and general obstetric precautions including but not limited to vaginal bleeding, contractions, leaking of fluid and fetal movement were reviewed in detail with the patient. Please refer to After  Visit Summary for other counseling recommendations.  Return in about 2 weeks (around 06/06/2015). Was given rhogam before I saw patient  Adam PhenixJames G Arnold, MD

## 2015-05-23 NOTE — Patient Instructions (Signed)
Rh Incompatibility Rh incompatibility is a condition that occurs during pregnancy if a woman has Rh-negative blood and her baby has Rh-positive blood. "Rh-negative" and "Rh-positive" refer to whether or not the blood has an Rh factor. An Rh factor is a specific protein found on the surface of red blood cells. If a woman has Rh factor, she is Rh-positive. If she does not have an Rh factor, she is Rh-negative. Having or not having an Rh factor does not affect the mother's general health. However, it can cause problems during pregnancy.  WHAT KIND OF PROBLEMS CAN Rh INCOMPATIBILITY CAUSE? During pregnancy, blood from the baby can cross into the mother's bloodstream, especially during delivery. If a mother is Rh-negative and the baby is Rh-positive, the mother's defense system will react to the baby's blood as if it was a foreign substance and will create proteins (antibodies). This is called sensitization. Once the mother is sensitized, her Rh antibodies will cross the placenta to the baby and attack the baby's Rh-positive blood as if it is a harmful substance.  Rh incompatibility can also happen if the Rh-negative pregnant woman is exposed to the Rh factor during a blood transfusion with Rh-positive blood.  HOW DOES THIS CONDITION AFFECT MY BABY? The Rh antibodies that attack and destroy the baby's red blood cells can lead to hemolytic disease in the baby. Hemolytic disease is when the red blood cells break down. This can cause:   Yellowing of the skin and eyes (jaundice).  The body to not have enough healthy red blood cells (anemia).   Brain damage.   Heart failure.   Death.  These antibodies usually do not cause problems during a first pregnancy. This is because the blood from the baby often times crosses into the mother's bloodstream during delivery, and the baby is born before many of the antibodies can develop. However, the antibodies stay in your body once they have formed. Because of this,  Rh incompatibility is more likely to cause problems in second or later pregnancies (if the baby is Rh-positive).  HOW IS THIS CONDITION DIAGNOSED? When a woman becomes pregnant, blood tests may be done to find out her blood type and Rh factor. If the woman is Rh-negative, she also may have another blood test called an antibody screen. The antibody screen shows whether she has Rh antibodies in her blood. If she does, it means she was exposed to Rh-positive blood before, and she is at risk for Rh incompatibility.  To find out whether the baby is developing hemolytic anemia and how serious it is, caregivers may use more advanced tests, such as ultrasonography (commonly known as ultrasound).  HOW IS Rh INCOMPATIBILITY TREATED?  Rh incompatibility is treated with a shot of medicine called Rho (D) immune globulin. This medicine keeps the woman's body from making antibodies that can cause serious problems in the baby or future babies.  Two shots will be given, one at around your seventh month of pregnancy and the other within 72 hours of your baby being born. If you are Rh-negative, you will need this medicine every time you have a baby with Rh-positive blood. If you already have antibodies in your blood, Rho (D) immune globulin will not help. Your doctor will not give you this medicine, but will watch your pregnancy closely for problems instead.  This shot may also be given to an Rh-negative woman when the risk of blood transfer between the mom and baby is high. The risk is high with:     An amniocentesis.   A miscarriage or an abortion.   An ectopic pregnancy.   Any vaginal bleeding during pregnancy.    This information is not intended to replace advice given to you by your health care provider. Make sure you discuss any questions you have with your health care provider.   Document Released: 01/09/2002 Document Revised: 07/25/2013 Document Reviewed: 11/01/2012 Elsevier Interactive Patient  Education [employer]2016 Elsevier Inc.

## 2015-05-29 ENCOUNTER — Encounter (HOSPITAL_COMMUNITY): Payer: Self-pay | Admitting: *Deleted

## 2015-05-29 ENCOUNTER — Inpatient Hospital Stay (HOSPITAL_COMMUNITY)
Admission: AD | Admit: 2015-05-29 | Discharge: 2015-05-29 | Disposition: A | Discharge status: Home / Self Care | Payer: Medicaid Other | Source: Ambulatory Visit | Attending: Family Medicine | Admitting: Family Medicine

## 2015-05-29 DIAGNOSIS — O4703 False labor before 37 completed weeks of gestation, third trimester: Secondary | ICD-10-CM | POA: Diagnosis not present

## 2015-05-29 DIAGNOSIS — Z880 Allergy status to penicillin: Secondary | ICD-10-CM | POA: Diagnosis not present

## 2015-05-29 DIAGNOSIS — Z3A31 31 weeks gestation of pregnancy: Secondary | ICD-10-CM | POA: Diagnosis not present

## 2015-05-29 LAB — URINALYSIS, ROUTINE W REFLEX MICROSCOPIC
BILIRUBIN URINE: NEGATIVE
Glucose, UA: NEGATIVE mg/dL
HGB URINE DIPSTICK: NEGATIVE
KETONES UR: 15 mg/dL — AB
LEUKOCYTES UA: NEGATIVE
NITRITE: NEGATIVE
PROTEIN: NEGATIVE mg/dL
SPECIFIC GRAVITY, URINE: 1.025 (ref 1.005–1.030)
Urobilinogen, UA: 0.2 mg/dL (ref 0.0–1.0)
pH: 6 (ref 5.0–8.0)

## 2015-05-29 LAB — FETAL FIBRONECTIN: FETAL FIBRONECTIN: NEGATIVE

## 2015-05-29 MED ORDER — NIFEDIPINE 10 MG PO CAPS
10.0000 mg | ORAL_CAPSULE | Freq: Once | ORAL | Status: AC
  Administered 2015-05-29: 10 mg via ORAL
  Filled 2015-05-29: qty 1

## 2015-05-29 MED ORDER — NIFEDIPINE 20 MG PO CAPS: 20.0000 mg | ORAL_CAPSULE | Freq: Three times a day (TID) | ORAL | Status: DC | PRN

## 2015-05-29 MED ORDER — NIFEDIPINE 10 MG PO CAPS: 20.0000 mg | ORAL_CAPSULE | Freq: Three times a day (TID) | ORAL | Status: DC | PRN

## 2015-05-29 MED ORDER — TRAMADOL HCL 50 MG PO TABS
50.0000 mg | ORAL_TABLET | Freq: Once | ORAL | Status: AC
  Administered 2015-05-29: 50 mg via ORAL
  Filled 2015-05-29: qty 1

## 2015-05-29 NOTE — Discharge Instructions (Signed)

## 2015-05-29 NOTE — MAU Note (Signed)
Pt presents to MAU with complaints of pelvic pressure and contractions since the 19th of October. Denies any vaginal bleeding or abnormal vaginal discharge

## 2015-05-29 NOTE — MAU Provider Note (Addendum)
History     CSN: [id number]  Arrival date and time: 05/29/15 1713   First Provider Initiated Contact with Patient 05/29/15 1826      Chief Complaint  Patient presents with  . Contractions  . pelvic pressure    This is a 19 y.o. female at 5561w5d who presents with c/o pelvic pressure and pain with contractions for over 1.5 weeks. Denies leaking or bleeding. Reports + FM.  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 Titers have been stable at 8.   Pelvic Pain The patient's primary symptoms include pelvic pain. The patient's pertinent negatives include no genital itching, genital lesions, genital odor or vaginal bleeding. This is a new problem. The current episode started in the past 7 days. The problem occurs intermittently. The problem has been unchanged. The pain is mild. The problem affects both sides. She is pregnant. Associated symptoms include abdominal pain. Pertinent negatives include no back pain, constipation, diarrhea, dysuria, fever, flank pain, nausea or vomiting. Nothing aggravates the symptoms. She has tried nothing for the symptoms. She uses nothing for contraception.   RN Note: Pt presents to MAU with complaints of pelvic pressure and contractions since the 19th of October. Denies any vaginal bleeding or abnormal vaginal discharge  OB History    Gravida Para Term Preterm AB TAB SAB Ectopic Multiple Living   2 1 1       1       Past Medical History  Diagnosis Date  . Heart murmur   . Chlamydia     Past Surgical History  Procedure Laterality Date  . No past surgeries      Family History  Problem Relation Age of Onset  . Asthma Mother     Social History  Substance Use Topics  . Smoking status: Never Smoker   . Smokeless tobacco: Never Used  . Alcohol Use: No    Allergies:  Allergies  Allergen Reactions  . Kiwi Extract Swelling    Causes swelling of the mouth and throat.  Marland Kitchen. Penicillins Hives    Has patient had a PCN reaction causing  immediate rash, facial/tongue/throat swelling, SOB or lightheadedness with hypotension: Yes Has patient had a PCN reaction causing severe rash involving mucus membranes or skin necrosis: No Has patient had a PCN reaction that required hospitalization Yes Has patient had a PCN reaction occurring within the last 10 years: No If all of the above answers are "NO", then may proceed with Cephalosporin use.   . Acetaminophen Other (See Comments)    ONLY WITH THE 500 mg ts...gets dizzy    Prescriptions prior to admission  Medication Sig Dispense Refill Last Dose  . acetaminophen (TYLENOL) 325 MG tablet Take 650 mg by mouth every 6 (six) hours as needed for moderate pain.   05/29/2015 at 1000  . Prenatal Vit-Min-FA-Fish Oil ([facility] PRENATAL GUMMY) 0.4-113.5 MG CHEW Chew 2 tablets by mouth daily.    05/29/2015 at 0900    Review of Systems  Constitutional: Negative for fever.  Gastrointestinal: Positive for abdominal pain. Negative for nausea, vomiting, diarrhea and constipation.  Genitourinary: Positive for pelvic pain. Negative for dysuria and flank pain.  Musculoskeletal: Negative for back pain.  Neurological: Negative for focal weakness.   Physical Exam   Blood pressure 120/61, pulse 96, temperature 98.3 F (36.8 C), resp. rate 18, last menstrual period 10/11/2014, unknown if currently breastfeeding.  Physical Exam  Constitutional: She is oriented to person, place, and time. She appears well-developed and well-nourished. No distress.  HENT:  Head: Normocephalic.  Cardiovascular: Normal rate and regular rhythm.   Respiratory: Effort normal. No respiratory distress.  GI: Soft. She exhibits no distension. There is no tenderness. There is no rebound and no guarding.  Fetal heart rate reactive UCs every 2-4 min  Genitourinary: Vagina normal. No vaginal discharge found.  Dilation: 1 Effacement (%): 50 Station: -2 Exam by:: Artelia Laroche CNM   Musculoskeletal: Normal range of motion.   Neurological: She is alert and oriented to person, place, and time.  Skin: Skin is warm and dry.  Psychiatric: She has a normal mood and affect.    MAU Course  Procedures  MDM  Fetal fibronectin collected due to contraction frequency UA collected to rule out UTI   Results for orders placed or performed during the hospital encounter of 05/29/15 (from the past 24 hour(s))  Urinalysis, Routine w reflex microscopic (not at [facility])     Status: Abnormal   Collection Time: 05/29/15  5:55 PM  Result Value Ref Range   Color, Urine YELLOW YELLOW   APPearance CLEAR CLEAR   Specific Gravity, Urine 1.025 1.005 - 1.030   pH 6.0 5.0 - 8.0   Glucose, UA NEGATIVE NEGATIVE mg/dL   Hgb urine dipstick NEGATIVE NEGATIVE   Bilirubin Urine NEGATIVE NEGATIVE   Ketones, ur 15 (A) NEGATIVE mg/dL   Protein, ur NEGATIVE NEGATIVE mg/dL   Urobilinogen, UA 0.2 0.0 - 1.0 mg/dL   Nitrite NEGATIVE NEGATIVE   Leukocytes, UA NEGATIVE NEGATIVE  Fetal fibronectin     Status: None   Collection Time: 05/29/15  6:30 PM  Result Value Ref Range   Fetal Fibronectin NEGATIVE NEGATIVE   Was given Procardia x 3 doses. Still having contractions every 2-4 minutes after 3rd dose of Procardia.  Also c/o headache. Allergic to Tylenol so will Rx Tramadol.   Assessment and Plan  A:  SIUP at [redacted]w[redacted]d        Preterm labor with negative Fetal fibronectin, but persistent contractions P:  Dr Despina Hidden in the OR, need to consult him re: further treatment       Report to oncoming CNM  [facility] 05/29/2015, 6:27 PM     Pt feels H/A is better; now with some back discomfort. Feels ctx have decreased somewhat after Procardia x 3.  EFM 130-140s, +accels, no decels Ctx spaced out greatly; only interspersed UI Cx unchanged (int os FT/ext os 1/50%)  IUP@31 .5wks Preterm ctx without cx change Neg fFN  D/C home w/ preterm labor precautions Rx Procardia  q 8hr prn Increase water intake F/U on 06/06/15 as scheduled for next  visit  Cam Hai CNM 05/29/2015 10:01 PM

## 2015-06-04 IMAGING — US US OB TRANSVAGINAL
1 series · 14 of 28 positions shown · non-contrast
Comparison: None for this pregnancy

CLINICAL DATA: Pelvic pain, positive pregnancy test

EXAM:
OBSTETRIC <14 WK US AND TRANSVAGINAL OB US
TECHNIQUE: Both transabdominal and transvaginal ultrasound examinations were
performed for complete evaluation of the gestation as well as the
maternal uterus, adnexal regions, and pelvic cul-de-sac.
Transvaginal technique was performed to assess early pregnancy.

[Series 1: us ob comp less 14 wk · 14 of 67 slices shown]
[im 3/67]
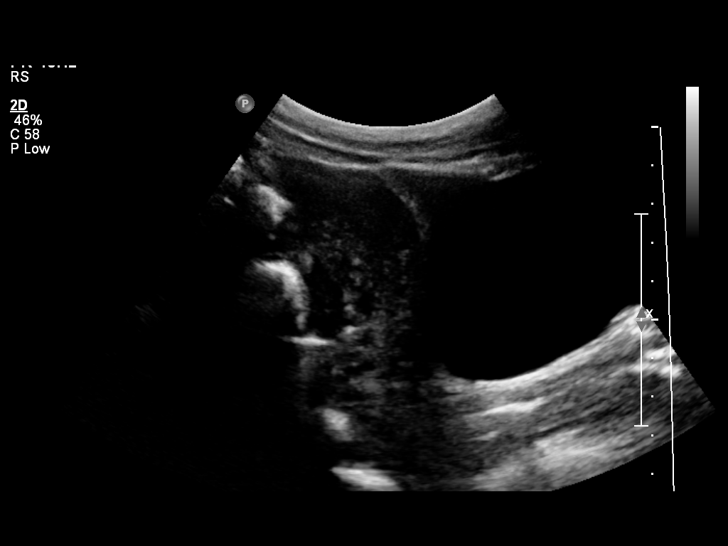
[im 8/67]
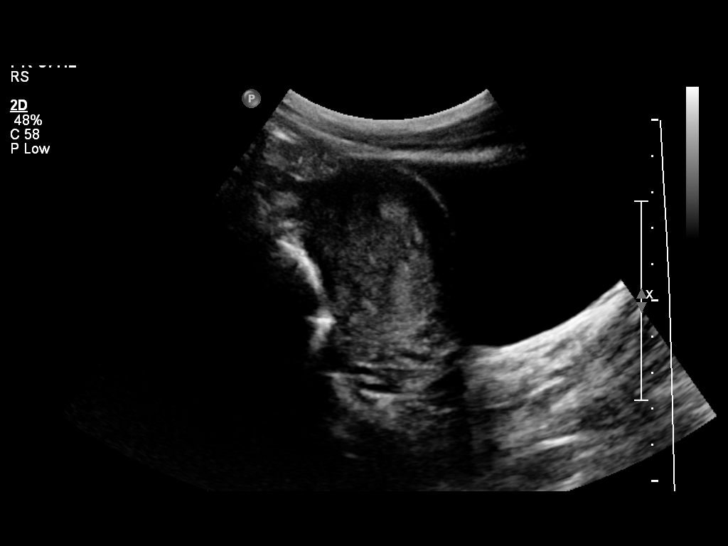
[im 13/67]
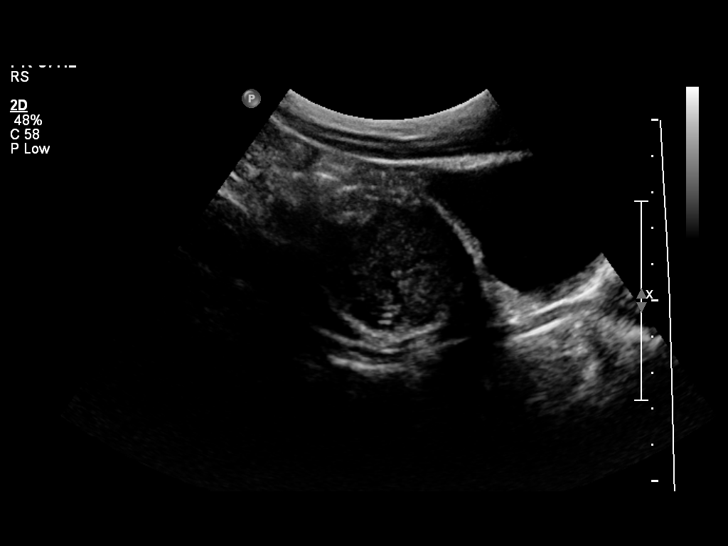
[im 18/67]
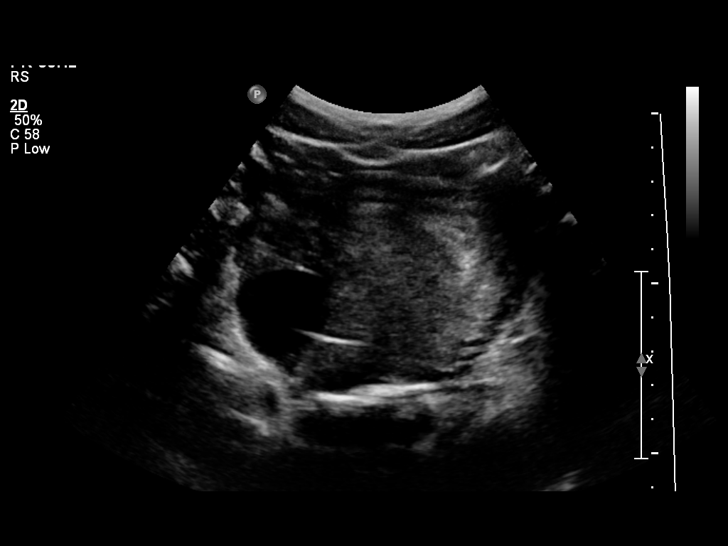
[im 23/67]
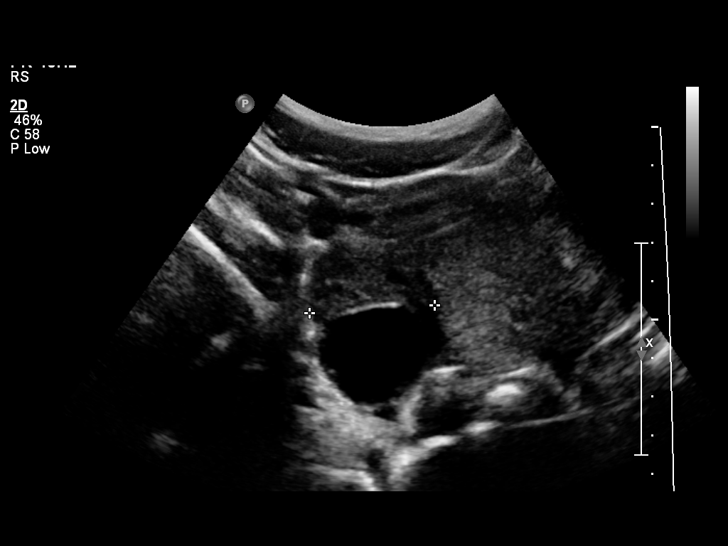
[im 27/67]
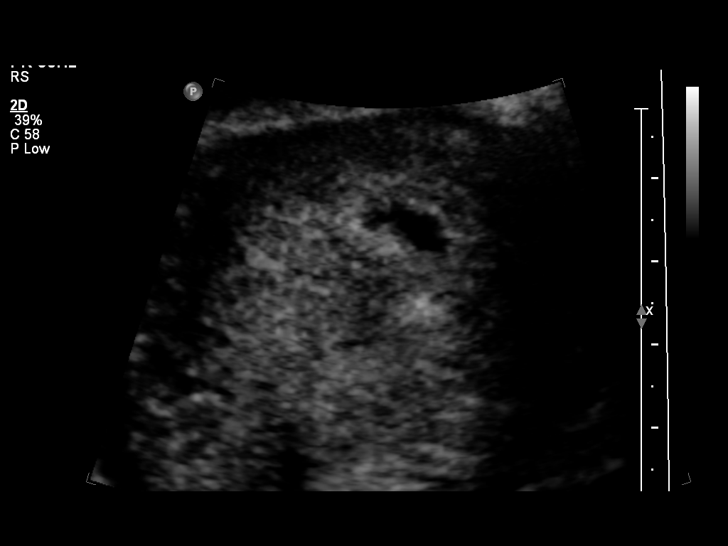
[im 32/67]
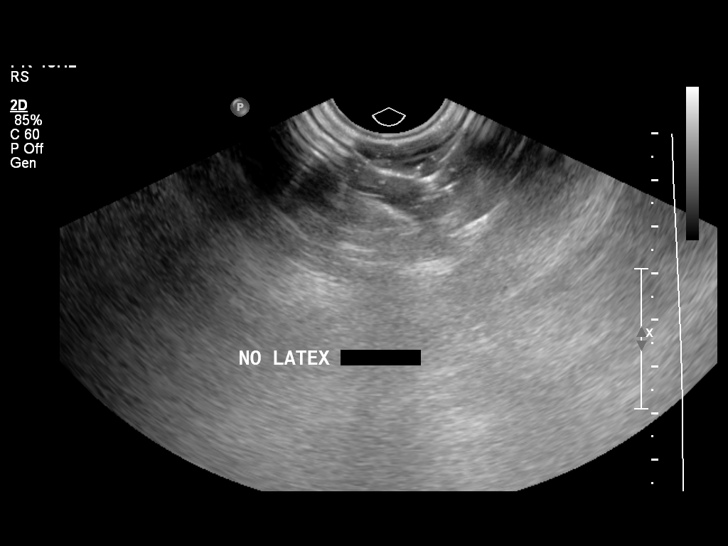
[im 37/67]
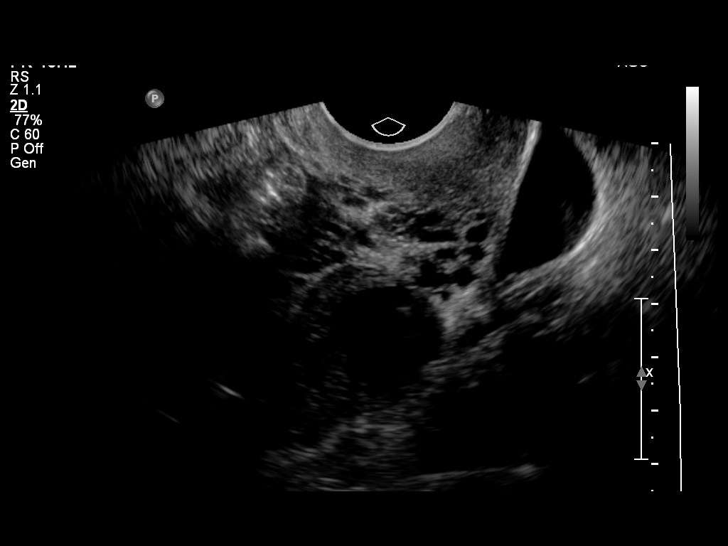
[im 42/67]
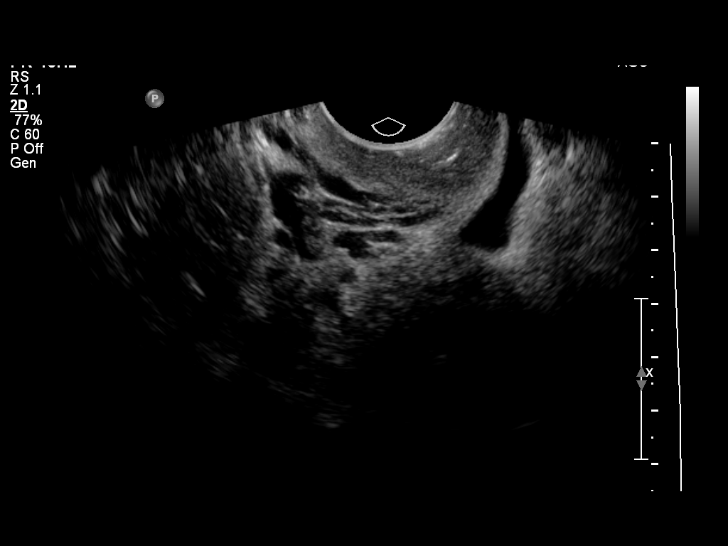
[im 47/67]
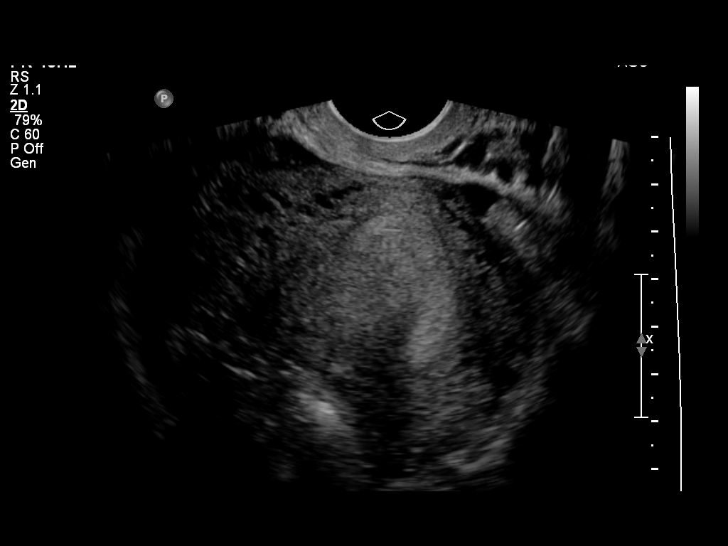
[im 52/67]
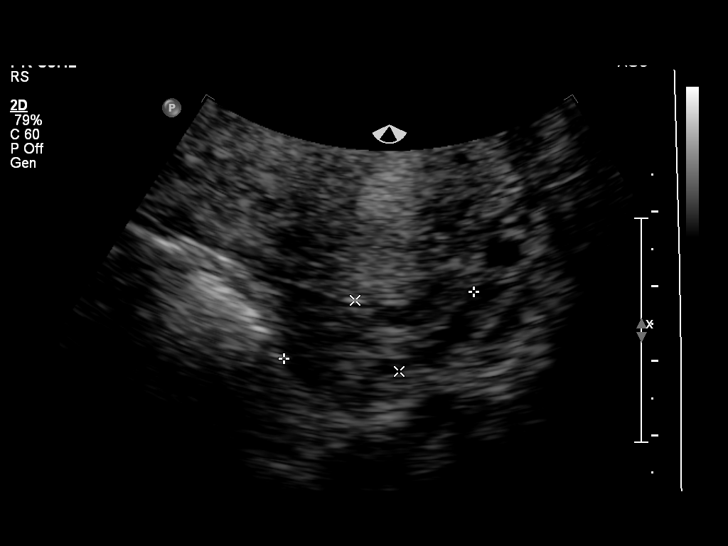
[im 57/67]
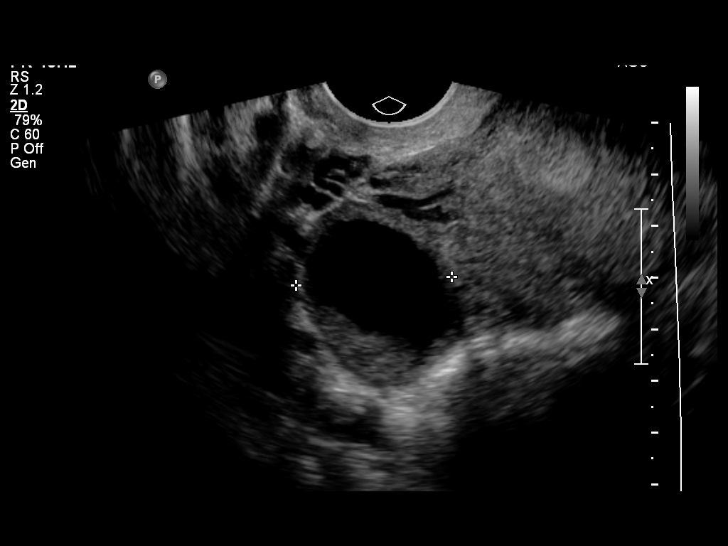
[im 62/67]
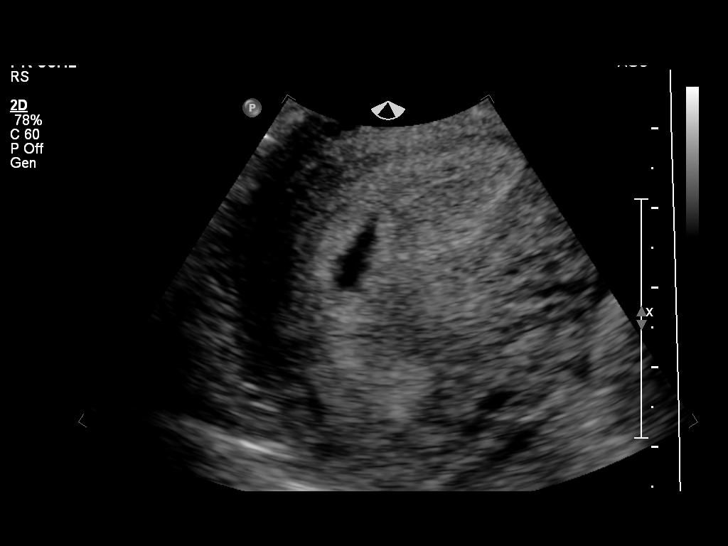
[im 67/67]
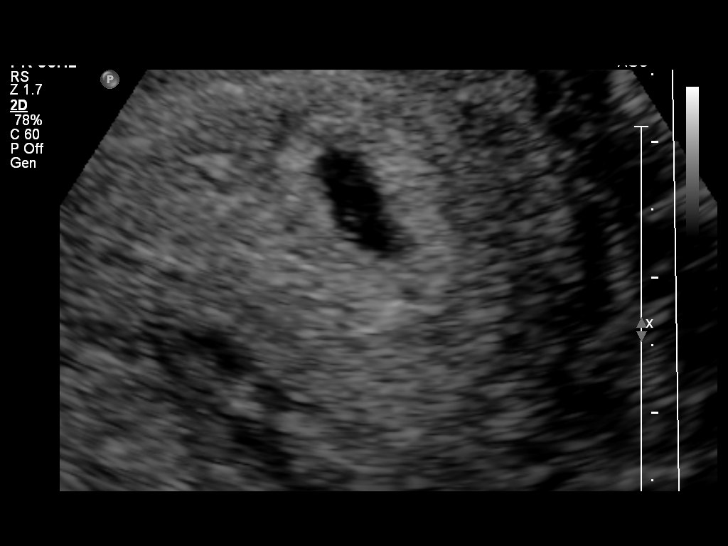

[14 of 28 positions shown; findings below may reference images not displayed]

FINDINGS: Intrauterine gestational sac: Visualized/normal in shape.

Yolk sac:  Visualized

Embryo:  Not visualized

Cardiac Activity: Not visualized

MSD: 7  mm   5 w   2  d

Maternal uterus/adnexae: Trace free fluid in the cul-de-sac. Ovaries
appear normal.
IMPRESSION: Intrauterine gestational sac and yolk sac identified but no fetal
pole or cardiac activity yet visualized. Recommend follow-up
ultrasound in 10-14 days to document appropriate pregnancy
progression and for the purposes of accurate dating.

## 2015-06-06 ENCOUNTER — Ambulatory Visit (INDEPENDENT_AMBULATORY_CARE_PROVIDER_SITE_OTHER): Payer: Medicaid Other | Admitting: Advanced Practice Midwife

## 2015-06-06 VITALS — BP 110/57 | HR 86 | Temp 98.5°F | Wt 147.4 lb

## 2015-06-06 DIAGNOSIS — O360130 Maternal care for anti-D [Rh] antibodies, third trimester, not applicable or unspecified: Secondary | ICD-10-CM

## 2015-06-06 DIAGNOSIS — O360121 Maternal care for anti-D [Rh] antibodies, second trimester, fetus 1: Secondary | ICD-10-CM

## 2015-06-06 DIAGNOSIS — O0932 Supervision of pregnancy with insufficient antenatal care, second trimester: Secondary | ICD-10-CM

## 2015-06-06 LAB — POCT URINALYSIS DIP (DEVICE)
BILIRUBIN URINE: NEGATIVE
Glucose, UA: NEGATIVE mg/dL
KETONES UR: NEGATIVE mg/dL
Nitrite: NEGATIVE
PH: 7 (ref 5.0–8.0)
Protein, ur: NEGATIVE mg/dL
SPECIFIC GRAVITY, URINE: 1.02 (ref 1.005–1.030)
Urobilinogen, UA: 0.2 mg/dL (ref 0.0–1.0)

## 2015-06-06 NOTE — Progress Notes (Signed)
Breastfeeding tip of the week reviewed Unine: Trace hgb, small amt wbcs

## 2015-06-06 NOTE — Patient Instructions (Signed)
Braxton Hicks Contractions °Contractions of the uterus can occur throughout pregnancy. Contractions are not always a sign that you are in labor.  °WHAT ARE BRAXTON HICKS CONTRACTIONS?  °Contractions that occur before labor are called Braxton Hicks contractions, or false labor. Toward the end of pregnancy (32-34 weeks), these contractions can develop more often and may become more forceful. This is not true labor because these contractions do not result in opening (dilatation) and thinning of the cervix. They are sometimes difficult to tell apart from true labor because these contractions can be forceful and people have different pain tolerances. You should not feel embarrassed if you go to the hospital with false labor. Sometimes, the only way to tell if you are in true labor is for your health care provider to look for changes in the cervix. °If there are no prenatal problems or other health problems associated with the pregnancy, it is completely safe to be sent home with false labor and await the onset of true labor. °HOW CAN YOU TELL THE DIFFERENCE BETWEEN TRUE AND FALSE LABOR? °False Labor °· The contractions of false labor are usually shorter and not as hard as those of true labor.   °· The contractions are usually irregular.   °· The contractions are often felt in the front of the lower abdomen and in the groin.   °· The contractions may go away when you walk around or change positions while lying down.   °· The contractions get weaker and are shorter lasting as time goes on.   °· The contractions do not usually become progressively stronger, regular, and closer together as with true labor.   °True Labor °· Contractions in true labor last 30-70 seconds, become very regular, usually become more intense, and increase in frequency.   °· The contractions do not go away with walking.   °· The discomfort is usually felt in the top of the uterus and spreads to the lower abdomen and low back.   °· True labor can be  determined by your health care provider with an exam. This will show that the cervix is dilating and getting thinner.   °WHAT TO REMEMBER °· Keep up with your usual exercises and follow other instructions given by your health care provider.   °· Take medicines as directed by your health care provider.   °· Keep your regular prenatal appointments.   °· Eat and drink lightly if you think you are going into labor.   °· If Braxton Hicks contractions are making you uncomfortable:   °¨ Change your position from lying down or resting to walking, or from walking to resting.   °¨ Sit and rest in a tub of warm water.   °¨ Drink 2-3 glasses of water. Dehydration may cause these contractions.   °¨ Do slow and deep breathing several times an hour.   °WHEN SHOULD I SEEK IMMEDIATE MEDICAL CARE? °Seek immediate medical care if: °· Your contractions become stronger, more regular, and closer together.   °· You have fluid leaking or gushing from your vagina.   °· You have a fever.   °· You pass blood-tinged mucus.   °· You have vaginal bleeding.   °· You have continuous abdominal pain.   °· You have low back pain that you never had before.   °· You feel your baby's head pushing down and causing pelvic pressure.   °· Your baby is not moving as much as it used to.   °  °This information is not intended to replace advice given to you by your health care provider. Make sure you discuss any questions you have with your health care   provider. °  °Document Released: 07/20/2005 Document Revised: 07/25/2013 Document Reviewed: 05/01/2013 °Elsevier Interactive Patient Education ©2016 [employer] ° °

## 2015-06-11 NOTE — Progress Notes (Signed)
Subjective:  Brandy [name] is a 19 y.o. G2P1001 at 8026w4d being seen today for ongoing prenatal care.  Patient reports no complaints.  Contractions: Irregular.  Vag. Bleeding: None. Movement: Present. Denies leaking of fluid.   The following portions of the patient's history were reviewed and updated as appropriate: allergies, current medications, past family history, past medical history, past social history, past surgical history and problem list. Problem list updated.  Objective:   Filed Vitals:   06/06/15 1115  BP: 110/57  Pulse: 86  Temp: 98.5 F (36.9 C)  Weight: 66.86 kg (147 lb 6.4 oz)    Fetal Status: Fetal Heart Rate (bpm): 138 Fundal Height: 33 cm Movement: Present     General:  Alert, oriented and cooperative. Patient is in no acute distress.  Skin: Skin is warm and dry. No rash noted.   Cardiovascular: Normal heart rate noted  Respiratory: Normal respiratory effort, no problems with respiration noted  Abdomen: Soft, gravid, appropriate for gestational age. Pain/Pressure: Present     Pelvic: Vag. Bleeding: None Vag D/C Character: Mucous   Cervical exam deferred        Extremities: Normal range of motion.  Edema: None  Mental Status: Normal mood and affect. Normal behavior. Normal judgment and thought content.   Urinalysis: Urine Protein: Negative Urine Glucose: Negative  Assessment and Plan:  Pregnancy: G2P1001 at 5226w4d  1. Late prenatal care affecting pregnancy in second trimester, antepartum    2. Anti-D antibodies present during pregnancy, third trimester, not applicable or unspecified fetus   3. Rh negative state in antepartum period, second trimester, fetus 1   Preterm labor symptoms and general obstetric precautions including but not limited to vaginal bleeding, contractions, leaking of fluid and fetal movement were reviewed in detail with the patient. Please refer to After Visit Summary for other counseling recommendations.  Return in about 2 weeks  (around 06/20/2015).   Dorathy KinsmanVirginia Katreena Schupp, CNM

## 2015-06-20 ENCOUNTER — Telehealth: Payer: Self-pay | Admitting: Obstetrics & Gynecology

## 2015-06-20 ENCOUNTER — Encounter: Payer: Medicaid Other | Admitting: Obstetrics & Gynecology

## 2015-06-20 NOTE — Telephone Encounter (Signed)
Called patient about missed appointment. Left message to call us back.

## 2015-06-28 ENCOUNTER — Encounter (HOSPITAL_COMMUNITY): Payer: Self-pay

## 2015-06-28 ENCOUNTER — Inpatient Hospital Stay (HOSPITAL_COMMUNITY)
Admission: AD | Admit: 2015-06-28 | Discharge: 2015-06-28 | Disposition: A | Discharge status: Home / Self Care | Payer: Medicaid Other | Source: Ambulatory Visit | Attending: Family Medicine | Admitting: Family Medicine

## 2015-06-28 DIAGNOSIS — R102 Pelvic and perineal pain unspecified side: Secondary | ICD-10-CM

## 2015-06-28 DIAGNOSIS — Z880 Allergy status to penicillin: Secondary | ICD-10-CM | POA: Diagnosis not present

## 2015-06-28 DIAGNOSIS — O361920 Maternal care for other isoimmunization, second trimester, not applicable or unspecified: Secondary | ICD-10-CM

## 2015-06-28 DIAGNOSIS — O26893 Other specified pregnancy related conditions, third trimester: Secondary | ICD-10-CM | POA: Insufficient documentation

## 2015-06-28 DIAGNOSIS — Z3A36 36 weeks gestation of pregnancy: Secondary | ICD-10-CM | POA: Insufficient documentation

## 2015-06-28 DIAGNOSIS — O9989 Other specified diseases and conditions complicating pregnancy, childbirth and the puerperium: Secondary | ICD-10-CM | POA: Diagnosis not present

## 2015-06-28 DIAGNOSIS — O360130 Maternal care for anti-D [Rh] antibodies, third trimester, not applicable or unspecified: Secondary | ICD-10-CM

## 2015-06-28 DIAGNOSIS — O360121 Maternal care for anti-D [Rh] antibodies, second trimester, fetus 1: Secondary | ICD-10-CM

## 2015-06-28 LAB — TYPE AND SCREEN
ABO/RH(D): B NEG
ANTIBODY SCREEN: POSITIVE
DAT, IgG: NEGATIVE

## 2015-06-28 LAB — URINALYSIS, ROUTINE W REFLEX MICROSCOPIC
Bilirubin Urine: NEGATIVE
Glucose, UA: NEGATIVE mg/dL
Hgb urine dipstick: NEGATIVE
KETONES UR: NEGATIVE mg/dL
Nitrite: NEGATIVE
PH: 7 (ref 5.0–8.0)
PROTEIN: NEGATIVE mg/dL
Specific Gravity, Urine: 1.02 (ref 1.005–1.030)

## 2015-06-28 LAB — URINE MICROSCOPIC-ADD ON

## 2015-06-28 LAB — WET PREP, GENITAL
Clue Cells Wet Prep HPF POC: NONE SEEN
Sperm: NONE SEEN
TRICH WET PREP: NONE SEEN
Yeast Wet Prep HPF POC: NONE SEEN

## 2015-06-28 LAB — OB RESULTS CONSOLE GBS: GBS: NEGATIVE

## 2015-06-28 MED ORDER — CYCLOBENZAPRINE HCL 5 MG PO TABS
5.0000 mg | ORAL_TABLET | Freq: Once | ORAL | Status: AC
  Administered 2015-06-28: 5 mg via ORAL
  Filled 2015-06-28: qty 1

## 2015-06-28 MED ORDER — CYCLOBENZAPRINE HCL 10 MG PO TABS: 10.0000 mg | ORAL_TABLET | Freq: Three times a day (TID) | ORAL | Status: DC | PRN

## 2015-06-28 NOTE — MAU Provider Note (Signed)
History    CSN: [id number] Arrival date and time: 06/28/15 1120 First Provider Initiated Contact with Patient 06/28/15 1148      Chief Complaint  Patient presents with  . Pelvic Pain   HPI Patient is 19 y.o. G2P1001 8427w0d here with complaints of  Vaginal pain/pressure.  Pelvic pain/pressure- located in vagina and front of pelvic. Tried tylenol, warm bath.  Pain is constant, patient reports is "can't do nothing cause the pain is so bad." Worse with contractions, movement Better with lying down.  Denies dysuria [facility]  +FM, denies LOF, VB, contractions, vaginal discharge.  OB History    Gravida Para Term Preterm AB TAB SAB Ectopic Multiple Living   2 1 1       1       Past Medical History  Diagnosis Date  . Heart murmur   . Chlamydia     Past Surgical History  Procedure Laterality Date  . No past surgeries      Family History  Problem Relation Age of Onset  . Asthma Mother     Social History  Substance Use Topics  . Smoking status: Never Smoker   . Smokeless tobacco: Never Used  . Alcohol Use: No    Allergies:  Allergies  Allergen Reactions  . Kiwi Extract Swelling    Causes swelling of the mouth and throat.  Marland Kitchen. Penicillins Hives    Has patient had a PCN reaction causing immediate rash, facial/tongue/throat swelling, SOB or lightheadedness with hypotension: Yes Has patient had a PCN reaction causing severe rash involving mucus membranes or skin necrosis: No Has patient had a PCN reaction that required hospitalization Yes Has patient had a PCN reaction occurring within the last 10 years: No If all of the above answers are "NO", then may proceed with Cephalosporin use.     Prescriptions prior to admission  Medication Sig Dispense Refill Last Dose  . acetaminophen (TYLENOL) 325 MG tablet Take 650 mg by mouth every 6 (six) hours as needed for moderate pain.   Taking  . NIFEdipine (PROCARDIA) 20 MG capsule Take 1 capsule (20 mg  total) by mouth every 8 (eight) hours as needed (for >5 ctx/hr). (Patient not taking: Reported on 06/06/2015) 30 capsule 1 Not Taking  . Prenatal Vit-Min-FA-Fish Oil ([employer] PRENATAL GUMMY) 0.4-113.5 MG CHEW Chew 2 tablets by mouth daily.    Taking    Review of Systems  Constitutional: Negative for fever and chills.  Eyes: Negative for blurred vision and double vision.  Respiratory: Negative for cough and shortness of breath.   Cardiovascular: Negative for chest pain and orthopnea.  Gastrointestinal: Negative for nausea and vomiting.  Genitourinary: Negative for dysuria, frequency and flank pain.  Musculoskeletal: Negative for myalgias.  Skin: Negative for rash.  Neurological: Negative for dizziness, tingling, weakness and headaches.  Endo/Heme/Allergies: Does not bruise/bleed easily.  Psychiatric/Behavioral: Negative for depression and suicidal ideas. The patient is not nervous/anxious.    Physical Exam   Blood pressure 118/71, pulse 98, temperature 98 F (36.7 C), resp. rate 18, height 5\' 4"  (1.626 m), weight 146 lb (66.225 kg), last menstrual period 10/11/2014, unknown if currently breastfeeding.  Physical Exam  Nursing note and vitals reviewed. Constitutional: She is oriented to person, place, and time. She appears well-developed and well-nourished. No distress.  Pregnant female  HENT:  Head: Normocephalic and atraumatic.  Eyes: Conjunctivae are normal. No scleral icterus.  Neck: Normal range of motion. Neck supple.  Cardiovascular: Normal rate and intact  distal pulses.   Respiratory: Effort normal. She exhibits no tenderness.  GI: Soft. There is no tenderness. There is no rebound and no guarding.  Gravid  Genitourinary: Vagina normal.  Musculoskeletal: Normal range of motion. She exhibits no edema.  Neurological: She is alert and oriented to person, place, and time.  Skin: Skin is warm and dry. No rash noted.  Psychiatric: She has a normal mood and affect.  Dilation:  2.5 Effacement (%): 30 Cervical Position: Posterior Exam by:: Dr. Alvester Morin    MAU Course  Procedures  MDM Wet prep-negative GC CT- pending GBS collected given patient missed visit Antibody titer-ordered since patient missed last appt at 35 weeks.   NST: 130/mod/+accels (multiple) and no decels Toco: q15-20  Assessment and Plan  Nashea Schaus is a 19 y.o. G2P1001 at [redacted]w[redacted]d presenting with vaginal pressure/pain -Infant is low in pelvis, likely physiologic cause of vaginal pain -rx for flexeril  -discussed warm baths -Reviewed in detail return precautions.  -I am not concerned for PTL, patient only having occasional contractions. She is 2.5 cm but not very effaced.  -Discharged home with appropriate follow up below  Future Appointments Date Time Provider Department Center  07/04/2015 10:45 AM Levie Heritage, DO [facility]  07/11/2015 11:05 AM Eino Farber Kennith Gain, CNM [facility]  07/18/2015 11:05 AM Eino Farber Kennith Gain, CNM [facility]  07/25/2015 11:05 AM Tereso Newcomer, MD [facility]  08/01/2015 11:05 AM Reva Bores, MD [facility] 06/28/2015, 12:56 PM

## 2015-06-28 NOTE — Discharge Instructions (Signed)

## 2015-06-28 NOTE — MAU Note (Signed)
Notified provider that patient is G2P1 with c/o pelvic pelvic pain. Provider she would come to see the patient

## 2015-06-28 NOTE — MAU Note (Signed)
Pt presents to MAU with complaints of pain in her pelvis that comes and goes. Denies any vaginal bleeding or abnormal discharge

## 2015-06-30 LAB — CULTURE, BETA STREP (GROUP B ONLY)

## 2015-07-01 LAB — GC/CHLAMYDIA PROBE AMP (~~LOC~~) NOT AT ARMC
Chlamydia: NEGATIVE
Neisseria Gonorrhea: NEGATIVE

## 2015-07-04 ENCOUNTER — Encounter (HOSPITAL_COMMUNITY): Payer: Self-pay

## 2015-07-04 ENCOUNTER — Inpatient Hospital Stay (HOSPITAL_COMMUNITY)
Admission: AD | Admit: 2015-07-04 | Discharge: 2015-07-06 | DRG: 774 | Disposition: A | Discharge status: Home / Self Care | Payer: Medicaid Other | Source: Ambulatory Visit | Attending: Family Medicine | Admitting: Family Medicine

## 2015-07-04 ENCOUNTER — Inpatient Hospital Stay (HOSPITAL_COMMUNITY): Payer: Medicaid Other | Admitting: Anesthesiology

## 2015-07-04 ENCOUNTER — Encounter: Payer: Medicaid Other | Admitting: Family Medicine

## 2015-07-04 DIAGNOSIS — Z3A36 36 weeks gestation of pregnancy: Secondary | ICD-10-CM

## 2015-07-04 DIAGNOSIS — B009 Herpesviral infection, unspecified: Secondary | ICD-10-CM | POA: Diagnosis present

## 2015-07-04 DIAGNOSIS — O360190 Maternal care for anti-D [Rh] antibodies, unspecified trimester, not applicable or unspecified: Secondary | ICD-10-CM | POA: Diagnosis present

## 2015-07-04 DIAGNOSIS — Z6791 Unspecified blood type, Rh negative: Secondary | ICD-10-CM

## 2015-07-04 DIAGNOSIS — A6000 Herpesviral infection of urogenital system, unspecified: Secondary | ICD-10-CM | POA: Diagnosis present

## 2015-07-04 DIAGNOSIS — O98812 Other maternal infectious and parasitic diseases complicating pregnancy, second trimester: Secondary | ICD-10-CM

## 2015-07-04 DIAGNOSIS — IMO0001 Reserved for inherently not codable concepts without codable children: Secondary | ICD-10-CM

## 2015-07-04 DIAGNOSIS — O361920 Maternal care for other isoimmunization, second trimester, not applicable or unspecified: Secondary | ICD-10-CM | POA: Diagnosis present

## 2015-07-04 DIAGNOSIS — O26893 Other specified pregnancy related conditions, third trimester: Secondary | ICD-10-CM | POA: Diagnosis present

## 2015-07-04 DIAGNOSIS — O42913 Preterm premature rupture of membranes, unspecified as to length of time between rupture and onset of labor, third trimester: Principal | ICD-10-CM | POA: Diagnosis present

## 2015-07-04 DIAGNOSIS — O9832 Other infections with a predominantly sexual mode of transmission complicating childbirth: Secondary | ICD-10-CM | POA: Diagnosis present

## 2015-07-04 DIAGNOSIS — A749 Chlamydial infection, unspecified: Secondary | ICD-10-CM | POA: Diagnosis present

## 2015-07-04 DIAGNOSIS — O26899 Other specified pregnancy related conditions, unspecified trimester: Secondary | ICD-10-CM | POA: Diagnosis present

## 2015-07-04 LAB — CBC
HCT: 30.4 % — ABNORMAL LOW (ref 36.0–46.0)
Hemoglobin: 9.8 g/dL — ABNORMAL LOW (ref 12.0–15.0)
MCH: 31.2 pg (ref 26.0–34.0)
MCHC: 32.2 g/dL (ref 30.0–36.0)
MCV: 96.8 fL (ref 78.0–100.0)
Platelets: 179 10*3/uL (ref 150–400)
RBC: 3.14 MIL/uL — ABNORMAL LOW (ref 3.87–5.11)
RDW: 13 % (ref 11.5–15.5)
WBC: 6.8 10*3/uL (ref 4.0–10.5)

## 2015-07-04 LAB — POCT FERN TEST: POCT Fern Test: POSITIVE

## 2015-07-04 LAB — RPR: RPR Ser Ql: NONREACTIVE

## 2015-07-04 MED ORDER — FENTANYL 2.5 MCG/ML BUPIVACAINE 1/10 % EPIDURAL INFUSION (WH - ANES)
14.0000 mL/h | INTRAMUSCULAR | Status: DC | PRN
  Administered 2015-07-04 (×3): 14 mL/h via EPIDURAL
  Filled 2015-07-04 (×2): qty 125

## 2015-07-04 MED ORDER — BETAMETHASONE SOD PHOS & ACET 6 (3-3) MG/ML IJ SUSP
12.0000 mg | Freq: Once | INTRAMUSCULAR | Status: AC
  Administered 2015-07-04: 12 mg via INTRAMUSCULAR
  Filled 2015-07-04: qty 2

## 2015-07-04 MED ORDER — OXYCODONE-ACETAMINOPHEN 5-325 MG PO TABS
2.0000 | ORAL_TABLET | Freq: Once | ORAL | Status: AC
  Administered 2015-07-04: 2 via ORAL
  Filled 2015-07-04: qty 2

## 2015-07-04 MED ORDER — BUTORPHANOL TARTRATE 1 MG/ML IJ SOLN: 1.0000 mg | INTRAMUSCULAR | Status: DC | PRN

## 2015-07-04 MED ORDER — OXYCODONE-ACETAMINOPHEN 5-325 MG PO TABS: 1.0000 | ORAL_TABLET | ORAL | Status: DC | PRN

## 2015-07-04 MED ORDER — TERBUTALINE SULFATE 1 MG/ML IJ SOLN
0.2500 mg | Freq: Once | INTRAMUSCULAR | Status: DC | PRN
  Filled 2015-07-04: qty 1

## 2015-07-04 MED ORDER — CITRIC ACID-SODIUM CITRATE 334-500 MG/5ML PO SOLN: 30.0000 mL | ORAL | Status: DC | PRN

## 2015-07-04 MED ORDER — OXYTOCIN 40 UNITS IN LACTATED RINGERS INFUSION - SIMPLE MED
62.5000 mL/h | INTRAVENOUS | Status: DC
  Filled 2015-07-04: qty 1000

## 2015-07-04 MED ORDER — EPHEDRINE 5 MG/ML INJ
10.0000 mg | INTRAVENOUS | Status: DC | PRN
  Filled 2015-07-04: qty 2

## 2015-07-04 MED ORDER — OXYCODONE-ACETAMINOPHEN 5-325 MG PO TABS: 2.0000 | ORAL_TABLET | ORAL | Status: DC | PRN

## 2015-07-04 MED ORDER — DIPHENHYDRAMINE HCL 50 MG/ML IJ SOLN: 12.5000 mg | INTRAMUSCULAR | Status: DC | PRN

## 2015-07-04 MED ORDER — ONDANSETRON HCL 4 MG/2ML IJ SOLN
4.0000 mg | Freq: Four times a day (QID) | INTRAMUSCULAR | Status: DC | PRN
  Administered 2015-07-04 (×2): 4 mg via INTRAVENOUS
  Filled 2015-07-04 (×2): qty 2

## 2015-07-04 MED ORDER — FLEET ENEMA 7-19 GM/118ML RE ENEM
1.0000 | ENEMA | RECTAL | Status: DC | PRN
Start: 2015-07-04 — End: 2015-07-05

## 2015-07-04 MED ORDER — NALBUPHINE HCL 10 MG/ML IJ SOLN
10.0000 mg | Freq: Four times a day (QID) | INTRAMUSCULAR | Status: DC | PRN
  Administered 2015-07-04: 10 mg via INTRAVENOUS
  Filled 2015-07-04: qty 1

## 2015-07-04 MED ORDER — LIDOCAINE HCL (PF) 1 % IJ SOLN
INTRAMUSCULAR | Status: DC | PRN
  Administered 2015-07-04: 4 mL via EPIDURAL

## 2015-07-04 MED ORDER — ACETAMINOPHEN 325 MG PO TABS: 650.0000 mg | ORAL_TABLET | ORAL | Status: DC | PRN

## 2015-07-04 MED ORDER — PHENYLEPHRINE 40 MCG/ML (10ML) SYRINGE FOR IV PUSH (FOR BLOOD PRESSURE SUPPORT)
80.0000 ug | PREFILLED_SYRINGE | INTRAVENOUS | Status: DC | PRN
  Filled 2015-07-04: qty 2
  Filled 2015-07-04: qty 20

## 2015-07-04 MED ORDER — LIDOCAINE HCL (PF) 1 % IJ SOLN
30.0000 mL | INTRAMUSCULAR | Status: DC | PRN
  Filled 2015-07-04: qty 30

## 2015-07-04 MED ORDER — OXYTOCIN BOLUS FROM INFUSION
500.0000 mL | INTRAVENOUS | Status: DC
  Administered 2015-07-04: 500 mL via INTRAVENOUS

## 2015-07-04 MED ORDER — IBUPROFEN 600 MG PO TABS
600.0000 mg | ORAL_TABLET | Freq: Four times a day (QID) | ORAL | Status: DC
  Administered 2015-07-05 – 2015-07-06 (×7): 600 mg via ORAL
  Filled 2015-07-04 (×8): qty 1

## 2015-07-04 MED ORDER — LACTATED RINGERS IV BOLUS (SEPSIS)
1000.0000 mL | Freq: Once | INTRAVENOUS | Status: AC
  Administered 2015-07-04: 1000 mL via INTRAVENOUS

## 2015-07-04 MED ORDER — LACTATED RINGERS IV SOLN
500.0000 mL | INTRAVENOUS | Status: DC | PRN
  Administered 2015-07-04: 500 mL via INTRAVENOUS

## 2015-07-04 MED ORDER — OXYTOCIN 40 UNITS IN LACTATED RINGERS INFUSION - SIMPLE MED
1.0000 m[IU]/min | INTRAVENOUS | Status: DC
  Administered 2015-07-04: 2 m[IU]/min via INTRAVENOUS

## 2015-07-04 MED ORDER — MORPHINE SULFATE (PF) 10 MG/ML IV SOLN
10.0000 mg | Freq: Once | INTRAVENOUS | Status: AC
  Administered 2015-07-04: 10 mg via INTRAMUSCULAR
  Filled 2015-07-04: qty 1

## 2015-07-04 MED ORDER — LACTATED RINGERS IV SOLN
INTRAVENOUS | Status: DC
  Administered 2015-07-04 (×2): via INTRAVENOUS

## 2015-07-04 NOTE — Progress Notes (Signed)
Pt's pad very wet, some bloody show also noted on pad.  Pt states she was vomiting & had gush of fluid - ? Urine.  Fern slide collected.

## 2015-07-04 NOTE — Progress Notes (Signed)
   Brandy [name] is a 19 y.o. G2P1001 at 1623w6d  admitted for rupture of membranes  Subjective: Comfortable with epidural  Objective: Filed Vitals:   07/04/15 1901 07/04/15 1931 07/04/15 1946 07/04/15 2001  BP: 113/71 124/64  115/69  Pulse: 85 80  87  Temp:   98.1 F (36.7 C)   TempSrc:   Oral   Resp: 18   18  Height:      Weight:      SpO2:          FHT:  FHR: 140 bpm, variability: moderate,  accelerations:  Present,  decelerations:  Absent UC:   irregular, every 2-4 minutes SVE:   5/50/-2  AROM forebag and IUPC placed Pitocin @ 4 mu/min  Labs: Lab Results  Component Value Date   WBC 6.8 07/04/2015   HGB 9.8* 07/04/2015   HCT 30.4* 07/04/2015   MCV 96.8 07/04/2015   PLT 179 07/04/2015    Assessment / Plan: Induction of labor due to PROM,  progressing well on pitocin  Labor: Progressing normally Fetal Wellbeing:  Category I Pain Control:  Epidural Anticipated MOD:  NSVD  CRESENZO-DISHMAN,Kariel Skillman 07/04/2015, 8:19 PM

## 2015-07-04 NOTE — Progress Notes (Signed)
Got patient up to bathroom on steady. Had patient on toilet/ patient voided. Patient said she was weak/dizzy. Patient laid head on steady. i pulled emergency cord. Nurses and charge nurse came. Took patient back to bed. Took bp and gave patient a fluid bolus. Patient given something to eat and told to rest for a while.

## 2015-07-04 NOTE — Anesthesia Preprocedure Evaluation (Addendum)
Anesthesia Evaluation  Patient identified by MRN, date of birth, ID band Patient awake    Reviewed: Allergy & Precautions, NPO status , Patient's Chart, lab work & pertinent test results  Airway Mallampati: II  TM Distance: >3 FB Neck ROM: Full    Dental  (+) Teeth Intact   Pulmonary neg pulmonary ROS,    breath sounds clear to auscultation       Cardiovascular negative cardio ROS   Rhythm:Regular Rate:Normal     Neuro/Psych negative neurological ROS  negative psych ROS   GI/Hepatic negative GI ROS, Neg liver ROS,   Endo/Other  negative endocrine ROS  Renal/GU negative Renal ROS  negative genitourinary   Musculoskeletal negative musculoskeletal ROS (+)   Abdominal   Peds negative pediatric ROS (+)  Hematology negative hematology ROS (+)   Anesthesia Other Findings   Reproductive/Obstetrics (+) Pregnancy                            Lab Results  Component Value Date   WBC 6.8 07/04/2015   HGB 9.8* 07/04/2015   HCT 30.4* 07/04/2015   MCV 96.8 07/04/2015   PLT 179 07/04/2015   No results found for: INR, PROTIME   Anesthesia Physical Anesthesia Plan  ASA: II  Anesthesia Plan: Epidural   Post-op Pain Management:    Induction:   Airway Management Planned:   Additional Equipment:   Intra-op Plan:   Post-operative Plan:   Informed Consent: I have reviewed the patients History and Physical, chart, labs and discussed the procedure including the risks, benefits and alternatives for the proposed anesthesia with the patient or authorized representative who has indicated his/her understanding and acceptance.     Plan Discussed with:   Anesthesia Plan Comments:         Anesthesia Quick Evaluation

## 2015-07-04 NOTE — Progress Notes (Signed)
Labor Progress Note  Brandy [name] is a 19 y.o. G2P1001 at 2841w6d  admitted for PPROM and PTL. Patient currently in active labor   S:  Patient doing okay. She states that she feels a little bit nauseated. However has just received some zofran.   O:  BP 114/65 mmHg  Pulse 68  Temp(Src) 98.1 F (36.7 C) (Oral)  Resp 18  Ht 5\' 4"  (1.626 m)  Wt 148 lb (67.132 kg)  BMI 25.39 kg/m2  SpO2 98%  LMP 10/11/2014   FHT:  FHR: 115 bpm, variability: moderate,  accelerations:  Abscent,  decelerations:  Absent UC:   None SVE:   Dilation: 5 Effacement (%): 70 Station: -2 Exam by:: Foley,rn SROM, clear @ 7:50 AM  Pitocin @ 2 mu/min  Labs: Lab Results  Component Value Date   WBC 6.8 07/04/2015   HGB 9.8* 07/04/2015   HCT 30.4* 07/04/2015   MCV 96.8 07/04/2015   PLT 179 07/04/2015    Assessment / Plan: 19 y.o. G2P1001 1741w6d with PPROM and onset of PTL, patient in latent labor   Labor:  Starting Pitocin as patient is PPROM and currently without contractions. Patient cervix is unchanged  Fetal Wellbeing:  Category I Pain Control:  Epidural Anticipated MOD:  NSVD  Expectant management   Brandy CharsAsiyah Jennika Ringgold, MD

## 2015-07-04 NOTE — MAU Note (Signed)
Pt not in lobby.  

## 2015-07-04 NOTE — H&P (Signed)
LABOR ADMISSION HISTORY AND PHYSICAL  Brandy [name] is a 19 y.o. female G2P1001 with IUP at 1772w6d presenting for PPROM and PTL. She reports +FM, + contractions, No LOF, no VB, no blurry vision, headaches or peripheral edema, and RUQ pain.  She plans on breast and bottle feeding. She request IUD for birth control.  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   [facility]  Dating 8 week US Blood type: B/NEG/-- (09/15 0905) B neg  Genetic Screen 1 Screen:    AFP:     Quad:     NIPS: Antibody:POS (10/06 1608)POS  Anatomic US  Rubella: 4.37 (09/15 0905)Imm  GTT Early:               Third trimester: 60 RPR: NON REAC (09/15 0905)   Flu vaccine  declined HBsAg: NEGATIVE (09/15 0905) neg  TDaP vaccine  04/18/15                                Rhogam: 05/23/15 HIV: Non Reactive (07/22 0001)   Baby Food          breast                                     GBS:    (For PCN allergy, check sensitivities)  Contraception  OCP Pap:  Circumcision    Pediatrician    Support Person     Past Medical History: Past Medical History  Diagnosis Date  . Heart murmur   . Chlamydia     Past Surgical History: Past Surgical History  Procedure Laterality Date  . No past surgeries      Obstetrical History: OB History    Gravida Para Term Preterm AB TAB SAB Ectopic Multiple Living   2 1 1       1       Social History: Social History   Social History  . Marital Status: Single    Spouse Name: N/A  . Number of Children: N/A  . Years of Education: N/A   Social History Main Topics  . Smoking status: Never Smoker   . Smokeless tobacco: Never Used  . Alcohol Use: No  . Drug Use: No  . Sexual Activity: Not Currently     Comment: last sex Feb 01 2015    Other Topics Concern  . None   Social History Narrative    Family History: Family History  Problem Relation Age of Onset  . Asthma Mother     Allergies: Allergies  Allergen Reactions  . Kiwi Extract Swelling    Causes swelling of the mouth and throat.  Marland Kitchen. Penicillins Hives    Has patient had a PCN reaction causing immediate rash, facial/tongue/throat swelling, SOB or lightheadedness with hypotension: Yes Has patient had a PCN reaction causing severe rash involving mucus membranes or skin necrosis: No Has patient had a PCN reaction that required hospitalization Yes Has patient had a PCN reaction occurring within the last 10 years: No If all of the above answers are "NO", then may proceed with Cephalosporin use.     Prescriptions prior to admission  Medication Sig Dispense Refill Last Dose  . cyclobenzaprine (FLEXERIL) 10 MG tablet Take 1 tablet (10 mg total) by mouth 3 (three) times daily as needed for muscle spasms. May use 1/2 tab 30 tablet 0 Past Week at Unknown time  . Prenatal Vit-Min-FA-Fish Oil ([employer] PRENATAL GUMMY) 0.4-113.5 MG CHEW Chew 2 tablets by mouth daily.    07/03/2015 at Unknown time  . acetaminophen (TYLENOL) 325 MG tablet Take 650 mg by mouth every 6 (six) hours as needed for moderate pain.   prn     Review of Systems   All systems reviewed and negative except as stated in HPI  BP 105/58 mmHg  Pulse 65  Temp(Src) 98.4 F (36.9 C) (Oral)  Resp 18  LMP 10/11/2014 General appearance: alert and cooperative Lungs: clear to auscultation bilaterally Heart: regular rate and rhythm Abdomen: soft, non-tender; bowel sounds normal Extremities: Homans sign is negative, no sign of DVT, edema GYN: sterile speculum exam done, no active HSV lesions present Presentation: unsure Fetal monitoringBaseline: 110 bpm, Variability: Good {> 6 bpm), Accelerations: Reactive and Decelerations: Absent Uterine activity: Regular, every 3-4 minutes ROM @ 7:45 AM   Dilation:  4 Effacement (%): 70 Station: -2 Exam by:: Foley,rn   Prenatal labs: ABO, Rh: --/--/B NEG (11/25 1255) Antibody: POS (11/25 1255) Rubella: !Error! RPR: NON REAC (09/15 0905)  HBsAg: NEGATIVE (09/15 0905)  HIV: Non Reactive (07/22 0001)  GBS:    1 hr Glucola 60  Genetic screening  None  Anatomy US: Normal   Prenatal Transfer Tool  Maternal Diabetes: No Genetic Screening: Declined Maternal Ultrasounds/Referrals: Normal Fetal Ultrasounds or other Referrals:  None Maternal Substance Abuse:  No Significant Maternal Medications:  None Significant Maternal Lab Results: Lab values include: Group B Strep negative, Other: HSV 2 antibody POS, no outbreak but patient was not prophylaxis.  Results for orders placed or performed during the hospital encounter of 07/04/15 (from the past 24 hour(s))  Fern Test   Collection Time: 07/04/15  8:05 AM  Result Value Ref Range   POCT Fern Test Positive = ruptured amniotic membanes   CBC   Collection Time: 07/04/15  9:00 AM  Result Value Ref Range   WBC 6.8 4.0 - 10.5 K/uL   RBC 3.14 (L) 3.87 - 5.11 MIL/uL   Hemoglobin 9.8 (L) 12.0 - 15.0 g/dL   HCT 16.1 (L) 09.6 - 04.5 %   MCV 96.8 78.0 - 100.0 fL   MCH 31.2 26.0 - 34.0 pg   MCHC 32.2 30.0 - 36.0 g/dL   RDW 40.9 81.1 - 91.4 %   Platelets 179 150 - 400 K/uL    Patient Active Problem List   Diagnosis Date Noted  . Active labor 07/04/2015  . Rh negative state in antepartum period 2015/05/16  . Anti-D antibodies present during pregnancy 05/16/15  . 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 PID (pelvic inflammatory disease) 04/10/2015  . 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 Late prenatal care affecting pregnancy in second trimester, antepartum 03/09/2015  . Vaginal pain 03/09/2015  . Abdominal pain affecting pregnancy   . Chlamydia infection affecting pregnancy in second trimester, antepartum 01/28/2015    Assessment: Brandy  [name] is a 19 y.o. G2P1001 at [redacted]w[redacted]d here for PPROM and PTL, currently in active labor. Course complicated by presence of HSV IgG, has not received prophylaxis. Patient does not have any active HSV lesions on speculum exam.   #Labor: No augmentation, in active labor  #Pain:  Epidural  #FWB: Category I   #ID: GBS negative  #MOF: Breast and Bottle  #MOC: IUD  #Circ: n/a  Noralee Chars, MD    OB fellow attestation:  I have seen and examined this patient; I agree with above documentation in the resident's note.   Brandy [name] is a 19 y.o. G2P1001 here for SROM  PE: BP 114/50 mmHg  Pulse 85  Temp(Src) 97.4 F (36.3 C) (Oral)  Resp 20  Ht  (1.626 m)  Wt 148 lb (67.132 kg)  BMI 25.39 kg/m2  SpO2 98%  LMP 10/11/2014 Gen: calm comfortable, NAD Resp: normal effort, no distress Abd: gravid  ROS, labs, PMH reviewed  Plan: #Labor: No augmentation needed at admission as patient is regularly contracting. Possible augmentation if ctx pattern slows.   #Pain: Epidural  #FWB: Category I   #ID: GBS negative  #MOF: Breast and Bottle  #MOC: IUD  #Circ: n/a  #HSV: Patient was seen in MAU in August for vaginal swelling/redness and irritation. She has HSV ab drawn at that time. HSV2 AB was positive. Patient was not started on prophylaxis. Thorough sterile speculum exam showed no lesions nor has the patient had any blisters or continued irritation.  Patient is safe to continue with vaginal delivery. I personally discussed the patient's diagnosis of HSV with her.   Federico Flake, MD Family Medicine, OB Fellow 07/04/2015, 5:35 PM

## 2015-07-04 NOTE — MAU Note (Signed)
Pt c/o contractions all day, but got worse about an hour ago. Every 4-5 mins. Denies LOF, or vag bleeding. +FM

## 2015-07-04 NOTE — Anesthesia Procedure Notes (Signed)
Epidural Patient location during procedure: OB Start time: 07/04/2015 10:10 AM End time: 07/04/2015 10:16 AM  Staffing Anesthesiologist: Shona SimpsonHOLLIS, Rhian Asebedo D  Preanesthetic Checklist Completed: patient identified, site marked, surgical consent, pre-op evaluation, timeout performed, IV checked, risks and benefits discussed and monitors and equipment checked  Epidural Patient position: sitting Prep: Betadine Patient monitoring: heart rate, continuous pulse ox and blood pressure Approach: midline Location: L4-L5 Injection technique: LOR saline  Needle:  Needle type: Tuohy  Needle gauge: 18 G Needle length: 9 cm and 9 Catheter type: closed end flexible Catheter size: 20 Guage Test dose: negative and Other  Assessment Events: blood not aspirated, injection not painful, no injection resistance, negative IV test and no paresthesia  Additional Notes LOR @ 5.5  Patient identified. Risks/Benefits/Options discussed with patient including but not limited to bleeding, infection, nerve damage, paralysis, failed block, incomplete pain control, headache, blood pressure changes, nausea, vomiting, reactions to medications, itching and postpartum back pain. Confirmed with bedside nurse the patient's most recent platelet count. Confirmed with patient that they are not currently taking any anticoagulation, have any bleeding history or any family history of bleeding disorders. Patient expressed understanding and wished to proceed. All questions were answered. Sterile technique was used throughout the entire procedure. Please see nursing notes for vital signs. Test dose was given through epidural catheter and negative prior to continuing to dose epidural or start infusion. Warning signs of high block given to the patient including shortness of breath, tingling/numbness in hands, complete motor block, or any concerning symptoms with instructions to call for help. Patient was given instructions on fall risk and not  to get out of bed. All questions and concerns addressed with instructions to call with any issues or inadequate analgesia.      Patient tolerated the insertion well without complications.Reason for block:procedure for pain

## 2015-07-05 MED ORDER — LANOLIN HYDROUS EX OINT: TOPICAL_OINTMENT | CUTANEOUS | Status: DC | PRN

## 2015-07-05 MED ORDER — OXYTOCIN 40 UNITS IN LACTATED RINGERS INFUSION - SIMPLE MED: 62.5000 mL/h | INTRAVENOUS | Status: DC | PRN

## 2015-07-05 MED ORDER — ONDANSETRON HCL 4 MG PO TABS: 4.0000 mg | ORAL_TABLET | ORAL | Status: DC | PRN

## 2015-07-05 MED ORDER — BENZOCAINE-MENTHOL 20-0.5 % EX AERO: 1.0000 "application " | INHALATION_SPRAY | CUTANEOUS | Status: DC | PRN

## 2015-07-05 MED ORDER — OXYCODONE-ACETAMINOPHEN 5-325 MG PO TABS
1.0000 | ORAL_TABLET | ORAL | Status: DC | PRN
Start: 2015-07-05 — End: 2015-07-06
  Administered 2015-07-05: 1 via ORAL
  Filled 2015-07-05: qty 1

## 2015-07-05 MED ORDER — DIPHENHYDRAMINE HCL 25 MG PO CAPS: 25.0000 mg | ORAL_CAPSULE | Freq: Four times a day (QID) | ORAL | Status: DC | PRN

## 2015-07-05 MED ORDER — PRENATAL MULTIVITAMIN CH
1.0000 | ORAL_TABLET | Freq: Every day | ORAL | Status: DC
  Administered 2015-07-05 – 2015-07-06 (×2): 1 via ORAL
  Filled 2015-07-05 (×2): qty 1

## 2015-07-05 MED ORDER — MEASLES, MUMPS & RUBELLA VAC ~~LOC~~ INJ
0.5000 mL | INJECTION | Freq: Once | SUBCUTANEOUS | Status: DC
  Filled 2015-07-05: qty 0.5

## 2015-07-05 MED ORDER — ONDANSETRON HCL 4 MG/2ML IJ SOLN: 4.0000 mg | INTRAMUSCULAR | Status: DC | PRN

## 2015-07-05 MED ORDER — WITCH HAZEL-GLYCERIN EX PADS: 1.0000 "application " | MEDICATED_PAD | CUTANEOUS | Status: DC | PRN

## 2015-07-05 MED ORDER — DIBUCAINE 1 % RE OINT: 1.0000 "application " | TOPICAL_OINTMENT | RECTAL | Status: DC | PRN

## 2015-07-05 MED ORDER — OXYCODONE-ACETAMINOPHEN 5-325 MG PO TABS
2.0000 | ORAL_TABLET | ORAL | Status: DC | PRN
  Administered 2015-07-05: 2 via ORAL
  Filled 2015-07-05: qty 2

## 2015-07-05 MED ORDER — BISACODYL 10 MG RE SUPP: 10.0000 mg | Freq: Every day | RECTAL | Status: DC | PRN

## 2015-07-05 MED ORDER — FLEET ENEMA 7-19 GM/118ML RE ENEM: 1.0000 | ENEMA | Freq: Every day | RECTAL | Status: DC | PRN

## 2015-07-05 MED ORDER — FERROUS SULFATE 325 (65 FE) MG PO TABS
325.0000 mg | ORAL_TABLET | Freq: Two times a day (BID) | ORAL | Status: DC
  Administered 2015-07-05 – 2015-07-06 (×3): 325 mg via ORAL
  Filled 2015-07-05 (×3): qty 1

## 2015-07-05 MED ORDER — SENNOSIDES-DOCUSATE SODIUM 8.6-50 MG PO TABS
2.0000 | ORAL_TABLET | ORAL | Status: DC
  Filled 2015-07-05: qty 2

## 2015-07-05 MED ORDER — ACETAMINOPHEN 325 MG PO TABS: 650.0000 mg | ORAL_TABLET | ORAL | Status: DC | PRN

## 2015-07-05 MED ORDER — TETANUS-DIPHTH-ACELL PERTUSSIS 5-2.5-18.5 LF-MCG/0.5 IM SUSP: 0.5000 mL | Freq: Once | INTRAMUSCULAR | Status: DC

## 2015-07-05 MED ORDER — METHYLERGONOVINE MALEATE 0.2 MG PO TABS: 0.2000 mg | ORAL_TABLET | ORAL | Status: DC | PRN

## 2015-07-05 MED ORDER — METHYLERGONOVINE MALEATE 0.2 MG/ML IJ SOLN: 0.2000 mg | INTRAMUSCULAR | Status: DC | PRN

## 2015-07-05 MED ORDER — ZOLPIDEM TARTRATE 5 MG PO TABS: 5.0000 mg | ORAL_TABLET | Freq: Every evening | ORAL | Status: DC | PRN

## 2015-07-05 MED ORDER — SIMETHICONE 80 MG PO CHEW: 80.0000 mg | CHEWABLE_TABLET | ORAL | Status: DC | PRN

## 2015-07-05 NOTE — Progress Notes (Signed)
Post Partum Day 1 Subjective: no complaints, up ad lib, voiding and tolerating PO, small lochia, plans to breastfeed, IUD  Objective: Blood pressure 104/54, pulse 79, temperature 97.7 F (36.5 C), temperature source Oral, resp. rate 18, height 5\' 4"  (1.626 m), weight 67.132 kg (148 lb), last menstrual period 10/11/2014, SpO2 98 %, unknown if currently breastfeeding.  Physical Exam:  General: alert, cooperative and no distress Lochia:normal flow Chest: CTAB Heart: RRR no m/r/g Abdomen: +BS, soft, nontender,  Uterine Fundus: firm DVT Evaluation: No evidence of DVT seen on physical exam. Extremities: no  edema   Recent Labs  07/04/15 0900  HGB 9.8*  HCT 30.4*    Assessment/Plan: Plan for discharge tomorrow and Lactation consult   LOS: 1 day   CRESENZO-DISHMAN,Brandy [name] 07/05/2015, 8:43 AM

## 2015-07-05 NOTE — Addendum Note (Signed)
Addendum  created 07/05/15 0934 by Junious SilkMelinda Lorrane Mccay, CRNA   Modules edited: Clinical Notes   Clinical Notes:  File: [id number]

## 2015-07-05 NOTE — Lactation Note (Signed)
This note was copied from the chart of [name]. Lactation Consultation Note  Patient Name: [name] Today's Date: 07/05/2015 Reason for consult: Initial assessment;Other (Comment)  Baby is 14 hours old and prior to delivery - breast and bottle. Per mom - attempted breast feeding and baby wouldn't latch - so the nurse told me to try the bottle 1st Due to baby not sucking. LC reviewed infant's potential feeding patterns , especially in the 1st 24 hours.  LC encouraged mom to call on the nurses light of she decides to work on the breast feeding and latching,  If she decides only bottle feed to call for instructions with feeding the bottle when the baby is hungry.  During the conversation with mom she expressed wanting to breast feed, but now commitment seems questionable.  Mother informed of post-discharge support and given phone number to the lactation department, including services  for phone call assistance; out-patient appointments; and breastfeeding support group. List of other breastfeeding resources  in the community given in the handout. Encouraged mother to call for problems or concerns related to breastfeeding.   Maternal Data    Feeding Feeding Type:  (see LC note ) Nipple Type: Regular  LATCH Score/Interventions                      Lactation Tools Discussed/Used     Consult Status Consult Status: PRN Date: 07/05/15 Follow-up type: In-patient    Kathrin Greathouseorio, Ashlynn Gunnels Ann 07/05/2015, 12:21 PM

## 2015-07-05 NOTE — Anesthesia Postprocedure Evaluation (Signed)
Anesthesia Post Note  Patient: Brandy [name]  Procedure(s) Performed: * No procedures listed *  Patient location during evaluation: Mother Baby Anesthesia Type: Epidural Level of consciousness: awake, awake and alert and oriented Pain management: pain level controlled Vital Signs Assessment: post-procedure vital signs reviewed and stable Respiratory status: spontaneous breathing, nonlabored ventilation and respiratory function stable Cardiovascular status: blood pressure returned to baseline Postop Assessment: no backache, no headache, patient able to bend at knees, no signs of nausea or vomiting and adequate PO intake Anesthetic complications: no    Last Vitals:  Filed Vitals:   07/05/15 0500 07/05/15 0700  BP: 104/54 117/73  Pulse: 79 94  Temp: 36.5 C 36.9 C  Resp: 18 20    Last Pain:  Filed Vitals:   07/05/15 0912  PainSc: 7                  Naria Abbey

## 2015-07-06 ENCOUNTER — Encounter (HOSPITAL_COMMUNITY): Payer: Self-pay | Admitting: *Deleted

## 2015-07-06 MED ORDER — IBUPROFEN 600 MG PO TABS: 600.0000 mg | ORAL_TABLET | Freq: Four times a day (QID) | ORAL | Status: DC

## 2015-07-06 NOTE — Discharge Summary (Signed)
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 yo G2P1001 at 6735w6d was admitted in Latent Laboron 07/04/2015. Patient had an uncomplicated labor course as follows:  Membrane Rupture Time/Date: 7:50 AM ,07/04/2015   Intrapartum Procedures: Episiotomy: None [1]                                         Lacerations:  None [1]  Patient had a delivery of a Viable infant. 07/04/2015  Information for the patient's newborn:  Moseman, Girl Doriann [[id number]][[id number]]  Delivery Method: Vaginal, Spontaneous Delivery (Filed from Delivery Summary)   Pateint had an uncomplicated postpartum course.  She is ambulating, tolerating a regular diet, passing flatus, and urinating well. Patient is discharged home in stable condition on 07/06/2015.    Physical exam  Filed Vitals:   07/05/15 0500 07/05/15 0700 07/05/15 1730 07/06/15 0523  BP: 104/54 117/73 122/77 102/60  Pulse: 79 94 86 60  Temp: 97.7 F (36.5 C) 98.4 F (36.9 C) 98.2 F (36.8 C) 97.8 F (36.6 C)  TempSrc: Oral Oral Oral Oral   Resp: 18 20 18 17   Height:      Weight:      SpO2:  100%     General: alert, cooperative and no distress Lochia: appropriate Uterine Fundus: firm Incision: N/A DVT Evaluation: No evidence of DVT seen on physical exam. Negative Homan's sign. Labs: Lab Results  Component Value Date   WBC 6.8 07/04/2015   HGB 9.8* 07/04/2015   HCT 30.4* 07/04/2015   MCV 96.8 07/04/2015   PLT 179 07/04/2015   CMP Latest Ref Rng 09/24/2014  Glucose 70 - 99 mg/dL 94  BUN 6 - 23 mg/dL 10  Creatinine 7.250.50 - 3.661.10 mg/dL 4.400.77  Sodium 347135 - 425145 mmol/L 137  Potassium 3.5 - 5.1 mmol/L 3.5  Chloride 96 - 112 mmol/L 107  CO2 19 - 32 mmol/L 24  Calcium 8.4 - 10.5 mg/dL 8.8  Total Protein 6.0 - 8.3 g/dL 7.0  Total Bilirubin 0.3 - 1.2 mg/dL 0.6  Alkaline Phos 39 - 117  U/L 71  AST 0 - 37 U/L 18  ALT 0 - 35 U/L 14    Discharge instruction: per After Visit Summary and "Baby and Me Booklet".  After visit meds:    Medication List    ASK your doctor about these medications        acetaminophen 325 MG tablet  Commonly known as:  TYLENOL  Take 650 mg by mouth every 6 (six) hours as needed for moderate pain.     [employer] PRENATAL GUMMY 0.4-113.5 MG Chew  Chew 2 tablets by mouth daily.     cyclobenzaprine 10 MG tablet  Commonly known as:  FLEXERIL  Take 1 tablet (10 mg total) by mouth 3 (three) times daily as needed for muscle spasms. May use 1/2 tab        Diet: routine diet  Activity: Advance as tolerated. Pelvic rest for 6 weeks.   Outpatient follow up:6 weeks for PP visit and IUD placement  Postpartum contraception: IUD Mirena  Newborn Data: Live born female  Birth Weight: 6 lb 1.4 oz (2760 g) APGAR: 9, 9  Baby Feeding: Bottle Disposition:home with mother   07/06/2015 Federico Flake, MD

## 2015-07-06 NOTE — Discharge Instructions (Signed)

## 2015-07-07 LAB — TYPE AND SCREEN
ABO/RH(D): B NEG
Antibody Screen: POSITIVE
DAT, IGG: NEGATIVE
UNIT DIVISION: 0
Unit division: 0

## 2015-07-08 ENCOUNTER — Ambulatory Visit: Payer: Self-pay

## 2015-07-08 NOTE — Lactation Note (Signed)
This note was copied from the chart of Brandy Zariel Kavan. Lactation Consultation Note  Patient Name: Brandy [name] Today's Date: 07/08/2015 Reason for consult: Follow-up assessment;Hyperbilirubinemia;Infant < 6lbs;Late preterm infant Mom reports baby is not latching so she is bottle feeding. Mom reports pumping intermittently with hand pump. LC reviewed basic teaching, discussed supply/demand and stressed to Mom the importance of consistent pumping to encourage milk production, prevent engorgement and protect milk supply. Offered to set up DEBP for Mom to use, she declined. Mom reports her breasts are starting to fill, soft at this visit. Advised to refer to Baby N Me booklet page 24 for engorgement care and page 25 for breast milk storage. Advised Mom to give any breast milk she receives with pumping. Encouraged to pump every 3 hours for 15 minutes. [facility] to formula/bottle feed. Encouraged to call for questions/concerns.   Maternal Data    Feeding Feeding Type: Bottle Fed - Formula Nipple Type: Slow - flow  LATCH Score/Interventions                      Lactation Tools Discussed/Used Tools: Pump Breast pump type: Manual   Consult Status Consult Status: Complete Date: 07/08/15 Follow-up type: In-patient    Alfred LevinsGranger, Cartier Mapel Ann 07/08/2015, 3:46 PM

## 2015-07-11 ENCOUNTER — Encounter: Payer: Medicaid Other | Admitting: Family

## 2015-07-18 ENCOUNTER — Encounter: Payer: Medicaid Other | Admitting: Family

## 2015-07-25 ENCOUNTER — Encounter: Payer: Medicaid Other | Admitting: Obstetrics & Gynecology

## 2015-08-01 ENCOUNTER — Encounter: Payer: Medicaid Other | Admitting: Family Medicine

## 2015-08-12 ENCOUNTER — Ambulatory Visit: Payer: Medicaid Other | Admitting: Obstetrics & Gynecology

## 2016-05-31 ENCOUNTER — Inpatient Hospital Stay (HOSPITAL_COMMUNITY): Payer: Federal, State, Local not specified - Other

## 2016-05-31 ENCOUNTER — Encounter (HOSPITAL_COMMUNITY): Payer: Self-pay | Admitting: *Deleted

## 2016-05-31 ENCOUNTER — Inpatient Hospital Stay (HOSPITAL_COMMUNITY)
Admission: AD | Admit: 2016-05-31 | Discharge: 2016-05-31 | Disposition: A | Discharge status: Home / Self Care | Payer: Federal, State, Local not specified - Other | Source: Ambulatory Visit | Attending: Obstetrics & Gynecology | Admitting: Obstetrics & Gynecology

## 2016-05-31 DIAGNOSIS — Z880 Allergy status to penicillin: Secondary | ICD-10-CM | POA: Insufficient documentation

## 2016-05-31 DIAGNOSIS — Z3491 Encounter for supervision of normal pregnancy, unspecified, first trimester: Secondary | ICD-10-CM

## 2016-05-31 DIAGNOSIS — O26891 Other specified pregnancy related conditions, first trimester: Secondary | ICD-10-CM

## 2016-05-31 DIAGNOSIS — O23591 Infection of other part of genital tract in pregnancy, first trimester: Secondary | ICD-10-CM | POA: Diagnosis not present

## 2016-05-31 DIAGNOSIS — R109 Unspecified abdominal pain: Secondary | ICD-10-CM | POA: Diagnosis not present

## 2016-05-31 DIAGNOSIS — O26899 Other specified pregnancy related conditions, unspecified trimester: Secondary | ICD-10-CM

## 2016-05-31 DIAGNOSIS — Z3A11 11 weeks gestation of pregnancy: Secondary | ICD-10-CM | POA: Diagnosis not present

## 2016-05-31 DIAGNOSIS — N760 Acute vaginitis: Secondary | ICD-10-CM

## 2016-05-31 DIAGNOSIS — R1030 Lower abdominal pain, unspecified: Secondary | ICD-10-CM | POA: Diagnosis present

## 2016-05-31 DIAGNOSIS — O219 Vomiting of pregnancy, unspecified: Secondary | ICD-10-CM

## 2016-05-31 DIAGNOSIS — B9689 Other specified bacterial agents as the cause of diseases classified elsewhere: Secondary | ICD-10-CM | POA: Insufficient documentation

## 2016-05-31 DIAGNOSIS — O210 Mild hyperemesis gravidarum: Secondary | ICD-10-CM | POA: Diagnosis not present

## 2016-05-31 DIAGNOSIS — R1031 Right lower quadrant pain: Secondary | ICD-10-CM | POA: Insufficient documentation

## 2016-05-31 LAB — WET PREP, GENITAL
SPERM: NONE SEEN
Trich, Wet Prep: NONE SEEN
Yeast Wet Prep HPF POC: NONE SEEN

## 2016-05-31 LAB — URINALYSIS, ROUTINE W REFLEX MICROSCOPIC
Bilirubin Urine: NEGATIVE
GLUCOSE, UA: NEGATIVE mg/dL
HGB URINE DIPSTICK: NEGATIVE
KETONES UR: NEGATIVE mg/dL
Leukocytes, UA: NEGATIVE
Nitrite: NEGATIVE
PH: 6 (ref 5.0–8.0)
PROTEIN: NEGATIVE mg/dL
Specific Gravity, Urine: 1.025 (ref 1.005–1.030)

## 2016-05-31 LAB — CBC
HCT: 36 % (ref 36.0–46.0)
HEMOGLOBIN: 12.5 g/dL (ref 12.0–15.0)
MCH: 33.4 pg (ref 26.0–34.0)
MCHC: 34.7 g/dL (ref 30.0–36.0)
MCV: 96.3 fL (ref 78.0–100.0)
PLATELETS: 278 10*3/uL (ref 150–400)
RBC: 3.74 MIL/uL — ABNORMAL LOW (ref 3.87–5.11)
RDW: 12.7 % (ref 11.5–15.5)
WBC: 5.6 10*3/uL (ref 4.0–10.5)

## 2016-05-31 LAB — HCG, QUANTITATIVE, PREGNANCY: hCG, Beta Chain, Quant, S: 129559 m[IU]/mL — ABNORMAL HIGH (ref <5)

## 2016-05-31 LAB — POCT PREGNANCY, URINE: Preg Test, Ur: POSITIVE — AB

## 2016-05-31 MED ORDER — METRONIDAZOLE 500 MG PO TABS: 500.0000 mg | ORAL_TABLET | Freq: Two times a day (BID) | ORAL | 0 refills | Status: DC

## 2016-05-31 MED ORDER — PROMETHAZINE HCL 25 MG PO TABS: 25.0000 mg | ORAL_TABLET | Freq: Four times a day (QID) | ORAL | 0 refills | Status: DC | PRN

## 2016-05-31 NOTE — MAU Note (Signed)
LMP about 2 months ago,  Stomach pain for past week today pain was worse on right side.  Vomiting x 1 month unable to eat anything

## 2016-05-31 NOTE — Discharge Instructions (Signed)
Morning Sickness Morning sickness is when you feel sick to your stomach (nauseous) during pregnancy. This nauseous feeling may or may not come with vomiting. It often occurs in the morning but can be a problem any time of day. Morning sickness is most common during the first trimester, but it may continue throughout pregnancy. While morning sickness is unpleasant, it is usually harmless unless you develop severe and continual vomiting (hyperemesis gravidarum). This condition requires more intense treatment.  CAUSES  The cause of morning sickness is not completely known but seems to be related to normal hormonal changes that occur in pregnancy. RISK FACTORS You are at greater risk if you:  Experienced nausea or vomiting before your pregnancy.  Had morning sickness during a previous pregnancy.  Are pregnant with more than one baby, such as twins. TREATMENT  Do not use any medicines (prescription, over-the-counter, or herbal) for morning sickness without first talking to your health care provider. Your health care provider may prescribe or recommend:  Vitamin B6 supplements.  Anti-nausea medicines.  The herbal medicine ginger. HOME CARE INSTRUCTIONS   Only take over-the-counter or prescription medicines as directed by your health care provider.  Taking multivitamins before getting pregnant can prevent or decrease the severity of morning sickness in most women.  Eat a piece of dry toast or unsalted crackers before getting out of bed in the morning.  Eat five or six small meals a day.  Eat dry and bland foods (rice, baked potato). Foods high in carbohydrates are often helpful.  Do not drink liquids with your meals. Drink liquids between meals.  Avoid greasy, fatty, and spicy foods.  Get someone to cook for you if the smell of any food causes nausea and vomiting.  If you feel nauseous after taking prenatal vitamins, take the vitamins at night or with a snack.  Snack on protein  foods (nuts, yogurt, cheese) between meals if you are hungry.  Eat unsweetened gelatins for desserts.  Wearing an acupressure wristband (worn for sea sickness) may be helpful.  Acupuncture may be helpful.  Do not smoke.  Get a humidifier to keep the air in your house free of odors.  Get plenty of fresh air. SEEK MEDICAL CARE IF:   Your home remedies are not working, and you need medicine.  You feel dizzy or lightheaded.  You are losing weight. SEEK IMMEDIATE MEDICAL CARE IF:   You have persistent and uncontrolled nausea and vomiting.  You pass out (faint). MAKE SURE YOU:  Understand these instructions.  Will watch your condition.  Will get help right away if you are not doing well or get worse.   This information is not intended to replace advice given to you by your health care provider. Make sure you discuss any questions you have with your health care provider.   Document Released: 09/10/2006 Document Revised: 07/25/2013 Document Reviewed: 01/04/2013 Elsevier Interactive Patient Education 2016 Elsevier Inc. Bacterial Vaginosis Bacterial vaginosis is a vaginal infection that occurs when the normal balance of bacteria in the vagina is disrupted. It results from an overgrowth of certain bacteria. This is the most common vaginal infection in women of childbearing age. Treatment is important to prevent complications, especially in pregnant women, as it can cause a premature delivery. CAUSES  Bacterial vaginosis is caused by an increase in harmful bacteria that are normally present in smaller amounts in the vagina. Several different kinds of bacteria can cause bacterial vaginosis. However, the reason that the condition develops is not fully understood.  RISK FACTORS Certain activities or behaviors can put you at an increased risk of developing bacterial vaginosis, including:  Having a new sex partner or multiple sex partners.  Douching.  Using an intrauterine device (IUD)  for contraception. Women do not get bacterial vaginosis from toilet seats, bedding, swimming pools, or contact with objects around them. SIGNS AND SYMPTOMS  Some women with bacterial vaginosis have no signs or symptoms. Common symptoms include:  Grey vaginal discharge.  A fishlike odor with discharge, especially after sexual intercourse.  Itching or burning of the vagina and vulva.  Burning or pain with urination. DIAGNOSIS  Your health care provider will take a medical history and examine the vagina for signs of bacterial vaginosis. A sample of vaginal fluid may be taken. Your health care provider will look at this sample under a microscope to check for bacteria and abnormal cells. A vaginal pH test may also be done.  TREATMENT  Bacterial vaginosis may be treated with antibiotic medicines. These may be given in the form of a pill or a vaginal cream. A second round of antibiotics may be prescribed if the condition comes back after treatment. Because bacterial vaginosis increases your risk for sexually transmitted diseases, getting treated can help reduce your risk for chlamydia, gonorrhea, HIV, and herpes. HOME CARE INSTRUCTIONS   Only take over-the-counter or prescription medicines as directed by your health care provider.  If antibiotic medicine was prescribed, take it as directed. Make sure you finish it even if you start to feel better.  Tell all sexual partners that you have a vaginal infection. They should see their health care provider and be treated if they have problems, such as a mild rash or itching.  During treatment, it is important that you follow these instructions:  Avoid sexual activity or use condoms correctly.  Do not douche.  Avoid alcohol as directed by your health care provider.  Avoid breastfeeding as directed by your health care provider. SEEK MEDICAL CARE IF:   Your symptoms are not improving after 3 days of treatment.  You have increased discharge or  pain.  You have a fever. MAKE SURE YOU:   Understand these instructions.  Will watch your condition.  Will get help right away if you are not doing well or get worse. FOR MORE INFORMATION  Centers for Disease Control and Prevention, Division of STD Prevention: SolutionApps.co.zawww.cdc.gov/std American Sexual Health Association (ASHA): www.ashastd.org    This information is not intended to replace advice given to you by your health care provider. Make sure you discuss any questions you have with your health care provider.   Document Released: 07/20/2005 Document Revised: 08/10/2014 Document Reviewed: 03/01/2013 Elsevier Interactive Patient Education 2016 Elsevier Inc.    Abdominal Pain During Pregnancy Abdominal pain is common in pregnancy. Most of the time, it does not cause harm. There are many causes of abdominal pain. Some causes are more serious than others. Some of the causes of abdominal pain in pregnancy are easily diagnosed. Occasionally, the diagnosis takes time to understand. Other times, the cause is not determined. Abdominal pain can be a sign that something is very wrong with the pregnancy, or the pain may have nothing to do with the pregnancy at all. For this reason, always tell your health care provider if you have any abdominal discomfort. HOME CARE INSTRUCTIONS  Monitor your abdominal pain for any changes. The following actions may help to alleviate any discomfort you are experiencing:  Do not have sexual intercourse or put anything  in your vagina until your symptoms go away completely.  Get plenty of rest until your pain improves.  Drink clear fluids if you feel nauseous. Avoid solid food as long as you are uncomfortable or nauseous.  Only take over-the-counter or prescription medicine as directed by your health care provider.  Keep all follow-up appointments with your health care provider. SEEK IMMEDIATE MEDICAL CARE IF:  You are bleeding, leaking fluid, or passing tissue from  the vagina.  You have increasing pain or cramping.  You have persistent vomiting.  You have painful or bloody urination.  You have a fever.  You notice a decrease in your baby's movements.  You have extreme weakness or feel faint.  You have shortness of breath, with or without abdominal pain.  You develop a severe headache with abdominal pain.  You have abnormal vaginal discharge with abdominal pain.  You have persistent diarrhea.  You have abdominal pain that continues even after rest, or gets worse. MAKE SURE YOU:   Understand these instructions.  Will watch your condition.  Will get help right away if you are not doing well or get worse.   This information is not intended to replace advice given to you by your health care provider. Make sure you discuss any questions you have with your health care provider.   Document Released: 07/20/2005 Document Revised: 05/10/2013 Document Reviewed: 02/16/2013 Elsevier Interactive Patient Education [employer] Else[employer].

## 2016-05-31 NOTE — MAU Provider Note (Signed)
History     CSN: [id number]  Arrival date and time: 05/31/16 1752    First Provider Initiated Contact with Patient 05/31/16 1835       Chief Complaint  Patient presents with  . Abdominal Pain   HPI Brandy [name] is a 20 y.o. [id number] at unknown gestation who presents with abdominal pain. Reports lower abdominal pain that is worse in the RLQ for the last week. Pain worsened today. Rates pain 7/10. Has not treated. Nothing makes better or worse. (of note, pt is giggling throughout my visit with her). Reports nausea & vomiting for the last month. States she has vomited once today and has not been able to keep "anything" down for a month. Denies vaginal discharge, vaginal bleeding, dysuria, fever/chills, diarrhea, or constipation.   OB History    Gravida Para Term Preterm AB Living   3 2 1 1   2    SAB TAB Ectopic Multiple Live Births           2      Past Medical History:  Diagnosis Date  . Chlamydia   . Heart murmur     Past Surgical History:  Procedure Laterality Date  . NO PAST SURGERIES      Family History  Problem Relation Age of Onset  . Asthma Mother     Social History  Substance Use Topics  . Smoking status: Never Smoker  . Smokeless tobacco: Never Used  . Alcohol use No    Allergies:  Allergies  Allergen Reactions  . Kiwi Extract Swelling    Causes swelling of the mouth and throat.  Marland Kitchen. Penicillins Hives    Has patient had a PCN reaction causing immediate rash, facial/tongue/throat swelling, SOB or lightheadedness with hypotension: Yes Has patient had a PCN reaction causing severe rash involving mucus membranes or skin necrosis: No Has patient had a PCN reaction that required hospitalization Yes Has patient had a PCN reaction occurring within the last 10 years: No If all of the above answers are "NO", then may proceed with Cephalosporin use.     Prescriptions Prior to Admission  Medication Sig Dispense Refill Last Dose  . acetaminophen (TYLENOL)  325 MG tablet Take 650 mg by mouth every 6 (six) hours as needed for moderate pain.   prn  . cyclobenzaprine (FLEXERIL) 10 MG tablet Take 1 tablet (10 mg total) by mouth 3 (three) times daily as needed for muscle spasms. May use 1/2 tab 30 tablet 0 Past Week at Unknown time  . ibuprofen (ADVIL,MOTRIN) 600 MG tablet Take 1 tablet (600 mg total) by mouth every 6 (six) hours. 90 tablet 0   . Prenatal Vit-Min-FA-Fish Oil ([facility] PRENATAL GUMMY) 0.4-113.5 MG CHEW Chew 2 tablets by mouth daily.    07/03/2015 at Unknown time    Review of Systems  Constitutional: Negative for chills and fever.  Gastrointestinal: Positive for abdominal pain, nausea and vomiting. Negative for constipation and diarrhea.  Genitourinary: Negative.    Physical Exam   Blood pressure 125/84, pulse 95, temperature 98.2 F (36.8 C), temperature source Oral, resp. rate 18, height 5\' 4"  (1.626 m), unknown if currently breastfeeding.  Physical Exam  Nursing note and vitals reviewed. Constitutional: She is oriented to person, place, and time. She appears well-developed and well-nourished. No distress.  HENT:  Head: Normocephalic and atraumatic.  Eyes: Conjunctivae are normal. Right eye exhibits no discharge. Left eye exhibits no discharge. No scleral icterus.  Neck: Normal range of motion.  Cardiovascular: Normal rate.  Respiratory: Effort normal. No respiratory distress.  GI: Soft. Bowel sounds are normal. She exhibits no distension. There is no tenderness. There is no rebound and no guarding.  Genitourinary: Uterus is enlarged. Cervix exhibits no motion tenderness. Right adnexum displays no mass and no tenderness. Left adnexum displays no mass and no tenderness.  Genitourinary Comments: Cervix closed  Neurological: She is alert and oriented to person, place, and time.  Skin: Skin is warm and dry. She is not diaphoretic.  Psychiatric: She has a normal mood and affect. Her behavior is normal. Judgment and thought content  normal.    MAU Course  Procedures No results found for this or any previous visit (from the past 24 hour(s)).   [place] Ob Comp Less 14 Wks  Result Date: 05/31/2016 CLINICAL DATA:  Acute onset of generalized abdominal pain and vomiting. Initial encounter. EXAM: OBSTETRIC <14 WK ULTRASOUND TECHNIQUE: Transabdominal ultrasound was performed for evaluation of the gestation as well as the maternal uterus and adnexal regions. COMPARISON:  Pelvic ultrasound performed 05/23/2015 FINDINGS: Intrauterine gestational sac: Single; visualized and normal in shape. Yolk sac:  Yes Embryo:  Yes Cardiac Activity: Yes Heart Rate: 157 bpm CRL:   5.03 cm   11 w 5 d                  US EDC: 12/15/2016 Subchorionic hemorrhage: A small amount of subchorionic hemorrhage is noted. Maternal uterus/adnexae: The uterus is otherwise unremarkable. The ovaries are within normal limits. The right ovary measures 3.7 x 1.5 x 2.0 cm, while the left ovary measures 3.8 x 1.5 x 1.9 cm. No suspicious adnexal masses are seen; there is no evidence for ovarian torsion. No free fluid is seen within the pelvic cul-de-sac. IMPRESSION: 1. Single live intrauterine pregnancy noted, with a crown-rump length of 5.0 cm, corresponding to a gestational age of [redacted] weeks 5 days. This matches the gestational age of [redacted] weeks 5 days by LMP, reflecting an estimated date of delivery of Dec 22, 2016. 2. Small amount of subchorionic hemorrhage noted. Electronically Signed   By: Roanna RaiderJeffery  Chang M.D.   On: 05/31/2016 19:15    MDM +UPT UA, wet prep, GC/chlamydia, CBC, ABO/Rh, quant hCG, HIV, and US today to rule out ectopic pregnancy U/a negative; no evidence of dehydration Ultrasound shows SIUP @ 3182w5d with cardiac activity Pt not observed vomiting while in MAU Pain completely resolved during visit without intervention  Assessment and Plan  A: 1. Normal IUP (intrauterine pregnancy) on prenatal ultrasound, first trimester   2. Abdominal pain during pregnancy   3.  Nausea and vomiting during pregnancy prior to [redacted] weeks gestation   4. BV (bacterial vaginosis)    P: Discharge home Rx flagyl & phenergan GC/CT, HIV pending Pregnancy verification letter & provider list given -- start prenatal care Discussed reasons to return to MAU  Judeth HornErin Marshell Rieger 05/31/2016, 6:35 PM

## 2016-06-01 LAB — GC/CHLAMYDIA PROBE AMP (~~LOC~~) NOT AT ARMC
Chlamydia: POSITIVE — AB
Neisseria Gonorrhea: NEGATIVE

## 2016-06-01 LAB — HIV ANTIBODY (ROUTINE TESTING W REFLEX): HIV SCREEN 4TH GENERATION: NONREACTIVE

## 2016-06-02 ENCOUNTER — Telehealth: Payer: Self-pay | Admitting: Student

## 2016-06-02 DIAGNOSIS — A749 Chlamydial infection, unspecified: Secondary | ICD-10-CM

## 2016-06-02 MED ORDER — AZITHROMYCIN 500 MG PO TABS: 1000.0000 mg | ORAL_TABLET | Freq: Once | ORAL | 0 refills | Status: AC

## 2016-06-02 NOTE — Telephone Encounter (Signed)
Patient notified of positive chlamydia. Rx sent to pharmacy on file for patient. Pt to inform partner to seek treatment. No intercourse x 2 weeks after both have been treated. Pt had no questions. [facility]   Judeth HornErin Iveth Heidemann, NP

## 2016-06-15 ENCOUNTER — Encounter (HOSPITAL_COMMUNITY): Payer: Self-pay | Admitting: *Deleted

## 2016-06-15 ENCOUNTER — Inpatient Hospital Stay (HOSPITAL_COMMUNITY)
Admission: AD | Admit: 2016-06-15 | Discharge: 2016-06-15 | Disposition: A | Discharge status: Home / Self Care | Payer: Medicaid Other | Source: Ambulatory Visit | Attending: Family Medicine | Admitting: Family Medicine

## 2016-06-15 DIAGNOSIS — H539 Unspecified visual disturbance: Secondary | ICD-10-CM | POA: Diagnosis not present

## 2016-06-15 DIAGNOSIS — Z79899 Other long term (current) drug therapy: Secondary | ICD-10-CM | POA: Diagnosis not present

## 2016-06-15 DIAGNOSIS — O98811 Other maternal infectious and parasitic diseases complicating pregnancy, first trimester: Secondary | ICD-10-CM | POA: Diagnosis not present

## 2016-06-15 DIAGNOSIS — R109 Unspecified abdominal pain: Secondary | ICD-10-CM

## 2016-06-15 DIAGNOSIS — Z3A13 13 weeks gestation of pregnancy: Secondary | ICD-10-CM | POA: Insufficient documentation

## 2016-06-15 DIAGNOSIS — O26899 Other specified pregnancy related conditions, unspecified trimester: Secondary | ICD-10-CM

## 2016-06-15 DIAGNOSIS — R1030 Lower abdominal pain, unspecified: Secondary | ICD-10-CM | POA: Insufficient documentation

## 2016-06-15 DIAGNOSIS — A749 Chlamydial infection, unspecified: Secondary | ICD-10-CM | POA: Diagnosis present

## 2016-06-15 DIAGNOSIS — O26891 Other specified pregnancy related conditions, first trimester: Secondary | ICD-10-CM | POA: Insufficient documentation

## 2016-06-15 DIAGNOSIS — O98812 Other maternal infectious and parasitic diseases complicating pregnancy, second trimester: Secondary | ICD-10-CM

## 2016-06-15 LAB — COMPREHENSIVE METABOLIC PANEL
ALBUMIN: 3.4 g/dL — AB (ref 3.5–5.0)
ALK PHOS: 44 U/L (ref 38–126)
ALT: 13 U/L — ABNORMAL LOW (ref 14–54)
ANION GAP: 8 (ref 5–15)
AST: 19 U/L (ref 15–41)
BILIRUBIN TOTAL: 0.5 mg/dL (ref 0.3–1.2)
BUN: 8 mg/dL (ref 6–20)
CALCIUM: 9.1 mg/dL (ref 8.9–10.3)
CO2: 23 mmol/L (ref 22–32)
Chloride: 104 mmol/L (ref 101–111)
Creatinine, Ser: 0.61 mg/dL (ref 0.44–1.00)
GFR calc Af Amer: >60 mL/min (ref >60)
GFR calc non Af Amer: >60 mL/min (ref >60)
GLUCOSE: 102 mg/dL — AB (ref 65–99)
Potassium: 3.4 mmol/L — ABNORMAL LOW (ref 3.5–5.1)
SODIUM: 135 mmol/L (ref 135–145)
TOTAL PROTEIN: 7.1 g/dL (ref 6.5–8.1)

## 2016-06-15 LAB — URINALYSIS, ROUTINE W REFLEX MICROSCOPIC
Bilirubin Urine: NEGATIVE
Glucose, UA: NEGATIVE mg/dL
Hgb urine dipstick: NEGATIVE
KETONES UR: NEGATIVE mg/dL
LEUKOCYTES UA: NEGATIVE
NITRITE: NEGATIVE
PROTEIN: NEGATIVE mg/dL
Specific Gravity, Urine: 1.015 (ref 1.005–1.030)
pH: 6 (ref 5.0–8.0)

## 2016-06-15 LAB — CBC
HEMATOCRIT: 34.7 % — AB (ref 36.0–46.0)
HEMOGLOBIN: 12.2 g/dL (ref 12.0–15.0)
MCH: 33.8 pg (ref 26.0–34.0)
MCHC: 35.2 g/dL (ref 30.0–36.0)
MCV: 96.1 fL (ref 78.0–100.0)
Platelets: 314 10*3/uL (ref 150–400)
RBC: 3.61 MIL/uL — AB (ref 3.87–5.11)
RDW: 12.7 % (ref 11.5–15.5)
WBC: 6.1 10*3/uL (ref 4.0–10.5)

## 2016-06-15 MED ORDER — AZITHROMYCIN 250 MG PO TABS
1000.0000 mg | ORAL_TABLET | Freq: Once | ORAL | Status: AC
  Administered 2016-06-15: 1000 mg via ORAL
  Filled 2016-06-15: qty 4

## 2016-06-15 NOTE — Discharge Instructions (Signed)
[facility]    [facility]       Phone: [phone]  [facility] [place] Phone: [phone]  [facility] [place]  Phone: [phone]  [facility] [place]  Phone: [phone]  [facility] [place]  Phone: [phone]  [facility]       Phone: [phone]  [facility]    Phone: [phone]   [facility] [place]    Phone: [phone]  [facility]    Phone: [phone]  [facility]    Phone: [phone]  [facility]    Phone: [phone]  [facility]       Phone: [phone]   [facility]  Phone: [phone]  [facility]    Phone: [phone]  [facility]   Phone: [phone]  [facility]      Phone: [phone]  [facility]    Phone: [phone]  Near-Syncope Near-syncope (commonly known as near fainting) is sudden weakness, dizziness, or feeling like you might pass out. During an episode of near-syncope, you may also develop pale skin, have tunnel vision, or feel sick to your stomach (nauseous). Near-syncope may occur when getting up after sitting or while standing for a long time. It is caused by a sudden decrease in blood flow to the brain. This decrease can result from various causes or triggers, most of which are not serious. However, because near-syncope can sometimes be a sign of something serious, a medical evaluation is required. The specific cause is often not determined. HOME CARE INSTRUCTIONS  Monitor your condition for any changes. The following actions may help to alleviate any discomfort you are experiencing:  Have someone stay with you  until you feel stable.  Lie down right away and prop your feet up if you start feeling like you might faint. Breathe deeply and steadily. Wait until all the symptoms have passed. Most of these episodes last only a few minutes. You may feel tired for several hours.   Drink enough fluids to keep your urine clear or pale yellow.   If you are taking blood pressure or heart medicine, get up slowly when seated or lying down. Take several minutes to sit and then stand. This can reduce dizziness.  Follow up with your health care provider as directed. SEEK IMMEDIATE MEDICAL CARE IF:   You have a severe headache.   You have unusual pain in the chest, abdomen, or back.   You are bleeding from the mouth or rectum, or you have black or tarry stool.   You have an irregular or very fast heartbeat.   You have repeated fainting or have seizure-like jerking during an episode.   You faint when sitting or lying down.   You have confusion.   You have difficulty walking.   You have severe weakness.   You have vision problems.  MAKE SURE YOU:   Understand these instructions.  Will watch your condition.  Will get help right away if you are not doing well or get worse.   This information is not intended to replace advice given to you by your health care provider. Make sure you discuss any questions you have with your health care provider.   Document Released: 07/20/2005 Document Revised: 07/25/2013 Document Reviewed: 12/23/2012 Elsevier Interactive Patient Education 2016  Elsevier Inc.   Chlamydia, Female Chlamydia is an infection. It is spread through sexual contact. Chlamydia can be in different areas of the body. These areas include the cervix, urethra, throat, or rectum. You may not know you have chlamydia because many people never develop the symptoms. Chlamydia is not difficult to treat once you know you have it. However, if it is left untreated, chlamydia can lead to more  serious health problems.  CAUSES  Chlamydia is caused by bacteria. It is a sexually transmitted disease. It is passed from an infected partner during intimate contact. This contact could be with the genitals, mouth, or rectal area. Chlamydia can also be passed from mothers to babies during birth. SIGNS AND SYMPTOMS  There may not be any symptoms. This is often the case early in the infection. If symptoms develop, they may include:  Mild pain and discomfort when urinating.  Redness, soreness, and swelling (inflammation) of the rectum.  Vaginal discharge.  Painful intercourse.  Abdominal pain.  Bleeding between menstrual periods. DIAGNOSIS  To diagnose this infection, your health care provider will do a pelvic exam. Cultures will be taken of the vagina, cervix, urine, and possibly the rectum to verify the diagnosis.  TREATMENT You will be given antibiotic medicines. If you are pregnant, certain types of antibiotics will need to be avoided. Any sexual partners should also be treated, even if they do not show symptoms. Your health care provider may test you for infection again 3 months after treatment. HOME CARE INSTRUCTIONS   Take your antibiotic medicine as directed by your health care provider. Finish the antibiotic even if you start to feel better.  Take medicines only as directed by your health care provider.  Inform any sexual partners about the infection. They should also be treated.  Do not have sexual contact until your health care provider tells you it is okay.  Get plenty of rest.  Eat a well-balanced diet.  Drink enough fluids to keep your urine clear or pale yellow.  Keep all follow-up visits as directed by your health care provider. SEEK MEDICAL CARE IF:  You have painful urination.  You have abdominal pain.  You have vaginal discharge.  You have painful sexual intercourse.  You have bleeding between periods and after sex.  You have a fever. SEEK IMMEDIATE  MEDICAL CARE IF:   You experience nausea or vomiting.  You experience excessive sweating (diaphoresis).  You have difficulty swallowing.   This information is not intended to replace advice given to you by your health care provider. Make sure you discuss any questions you have with your health care provider.   Document Released: 04/29/2005 Document Revised: 04/10/2015 Document Reviewed: 03/27/2013 Elsevier Interactive Patient Education [employer]2016 Elsevier Inc.

## 2016-06-15 NOTE — MAU Note (Signed)
Pt C/O dx'd with chlamydia recently, was told to go to [facility] for treatment.  Was told she can't get an appointment until someone else cancels.  Meanwhile pt having lower abd pain.  Feeling dizzy, seeing spots, feels like she is going to pass out when she changes positions.  Denies bleeding, has white creamy discharge.

## 2016-06-15 NOTE — MAU Provider Note (Signed)
Chief Complaint: Abdominal Pain and Dizziness   First Provider Initiated Contact with Patient 06/15/16 1728      SUBJECTIVE HPI: Brandy [name] is a 20 y.o. [id number] at 5945w6d by LMP who presents to maternity admissions reporting cramping abdominal pain x 3-4 days and episodes of seeing spots and her vision going black x 3 days.  She reports she is eating regularly and drinking plenty of water.  She was called and told she had chlamydia but reports the health department did not have an appointment.  Her partner was treated but she is not sexually active with him currently.  She desires prenatal care but reports she has called and some offices are not taking Medicaid, other offices are full. She denies vaginal bleeding, vaginal itching/burning, urinary symptoms, h/a, dizziness, n/v, or fever/chills.     HPI  Past Medical History:  Diagnosis Date  . Chlamydia   . Heart murmur    Past Surgical History:  Procedure Laterality Date  . NO PAST SURGERIES     Social History   Social History  . Marital status: Single    Spouse name: N/A  . Number of children: N/A  . Years of education: N/A   Occupational History  . Not on file.   Social History Main Topics  . Smoking status: Never Smoker  . Smokeless tobacco: Never Used  . Alcohol use No  . Drug use: No  . Sexual activity: Not Currently    Birth control/ protection: None     Comment: last sex Feb 01 2015   Other Topics Concern  . Not on file   Social History Narrative  . No narrative on file   No current facility-administered medications on file prior to encounter.    Current Outpatient Prescriptions on File Prior to Encounter  Medication Sig Dispense Refill  . cyclobenzaprine (FLEXERIL) 10 MG tablet Take 1 tablet (10 mg total) by mouth 3 (three) times daily as needed for muscle spasms. May use 1/2 tab (Patient not taking: Reported on 06/15/2016) 30 tablet 0  . metroNIDAZOLE (FLAGYL) 500 MG tablet Take 1 tablet (500 mg total)  by mouth 2 (two) times daily. (Patient not taking: Reported on 06/15/2016) 14 tablet 0  . promethazine (PHENERGAN) 25 MG tablet Take 1 tablet (25 mg total) by mouth every 6 (six) hours as needed for nausea or vomiting. (Patient not taking: Reported on 06/15/2016) 30 tablet 0   Allergies  Allergen Reactions  . Kiwi Extract Swelling    Causes swelling of the mouth and throat.  Marland Kitchen. Penicillins Hives    Has patient had a PCN reaction causing immediate rash, facial/tongue/throat swelling, SOB or lightheadedness with hypotension: Yes Has patient had a PCN reaction causing severe rash involving mucus membranes or skin necrosis: No Has patient had a PCN reaction that required hospitalization Yes Has patient had a PCN reaction occurring within the last 10 years: No If all of the above answers are "NO", then may proceed with Cephalosporin use.     ROS:  Review of Systems  Constitutional: Negative for chills, fatigue and fever.  Eyes: Positive for visual disturbance.  Respiratory: Negative for shortness of breath.   Cardiovascular: Negative for chest pain.  Gastrointestinal: Positive for abdominal pain.  Genitourinary: Positive for pelvic pain. Negative for difficulty urinating, dysuria, flank pain, vaginal bleeding, vaginal discharge and vaginal pain.  Neurological: Negative for dizziness and headaches.  Psychiatric/Behavioral: Negative.      I have reviewed patient's Past Medical Hx, Surgical Hx, Family  Hx, Social Hx, medications and allergies.   Physical Exam  Patient Vitals for the past 24 hrs:  BP Temp Temp src Pulse Resp Height Weight  06/15/16 1706 102/60 98.1 F (36.7 C) Oral 73 18 - -  06/15/16 1501 106/63 97.9 F (36.6 C) Oral 81 16 5\' 4"  (1.626 m) 127 lb (57.6 kg)   Constitutional: Well-developed, well-nourished female in no acute distress.  Cardiovascular: normal rate Respiratory: normal effort GI: Abd soft, non-tender. Pos BS x 4 MS: Extremities nontender, no edema, normal  ROM Neurological - alert, oriented, normal speech, no focal findings or movement disorder noted, screening mental status exam normal, neck supple without rigidity, cranial nerves II through XII intact, DTR's normal and symmetric, motor and sensory grossly normal bilaterally, normal muscle tone, no tremors, strength 5/5 GU: Neg CVAT.  PELVIC EXAM: Deferred  FHT 152 by doppler  LAB RESULTS Results for orders placed or performed during the hospital encounter of 06/15/16 (from the past 24 hour(s))  Urinalysis, Routine w reflex microscopic (not [facility])     Status: None   Collection Time: 06/15/16  3:05 PM  Result Value Ref Range   Color, Urine YELLOW YELLOW   APPearance CLEAR CLEAR   Specific Gravity, Urine 1.015 1.005 - 1.030   pH 6.0 5.0 - 8.0   Glucose, UA NEGATIVE NEGATIVE mg/dL   Hgb urine dipstick NEGATIVE NEGATIVE   Bilirubin Urine NEGATIVE NEGATIVE   Ketones, ur NEGATIVE NEGATIVE mg/dL   Protein, ur NEGATIVE NEGATIVE mg/dL   Nitrite NEGATIVE NEGATIVE   Leukocytes, UA NEGATIVE NEGATIVE  CBC     Status: Abnormal   Collection Time: 06/15/16  5:28 PM  Result Value Ref Range   WBC 6.1 4.0 - 10.5 K/uL   RBC 3.61 (L) 3.87 - 5.11 MIL/uL   Hemoglobin 12.2 12.0 - 15.0 g/dL   HCT 11.934.7 (L) 14.736.0 - 82.946.0 %   MCV 96.1 78.0 - 100.0 fL   MCH 33.8 26.0 - 34.0 pg   MCHC 35.2 30.0 - 36.0 g/dL   RDW 56.212.7 13.011.5 - 86.515.5 %   Platelets 314 150 - 400 K/uL  Comprehensive metabolic panel     Status: Abnormal   Collection Time: 06/15/16  5:28 PM  Result Value Ref Range   Sodium 135 135 - 145 mmol/L   Potassium 3.4 (L) 3.5 - 5.1 mmol/L   Chloride 104 101 - 111 mmol/L   CO2 23 22 - 32 mmol/L   Glucose, Bld 102 (H) 65 - 99 mg/dL   BUN 8 6 - 20 mg/dL   Creatinine, Ser 7.840.61 0.44 - 1.00 mg/dL   Calcium 9.1 8.9 - 69.610.3 mg/dL   Total Protein 7.1 6.5 - 8.1 g/dL   Albumin 3.4 (L) 3.5 - 5.0 g/dL   AST 19 15 - 41 U/L   ALT 13 (L) 14 - 54 U/L   Alkaline Phosphatase 44 38 - 126 U/L   Total Bilirubin 0.5 0.3  - 1.2 mg/dL   GFR calc non Af Amer >60 >60 mL/min   GFR calc Af Amer >60 >60 mL/min   Anion gap 8 5 - 15    --/--/B NEG (12/01 0900)  IMAGING [place] Ob Comp Less 14 Wks  Result Date: 05/31/2016 CLINICAL DATA:  Acute onset of generalized abdominal pain and vomiting. Initial encounter. EXAM: OBSTETRIC <14 WK ULTRASOUND TECHNIQUE: Transabdominal ultrasound was performed for evaluation of the gestation as well as the maternal uterus and adnexal regions. COMPARISON:  Pelvic ultrasound performed 05/23/2015 FINDINGS: Intrauterine  gestational sac: Single; visualized and normal in shape. Yolk sac:  Yes Embryo:  Yes Cardiac Activity: Yes Heart Rate: 157 bpm CRL:   5.03 cm   11 w 5 d                  [place] EDC: 12/15/2016 Subchorionic hemorrhage: A small amount of subchorionic hemorrhage is noted. Maternal uterus/adnexae: The uterus is otherwise unremarkable. The ovaries are within normal limits. The right ovary measures 3.7 x 1.5 x 2.0 cm, while the left ovary measures 3.8 x 1.5 x 1.9 cm. No suspicious adnexal masses are seen; there is no evidence for ovarian torsion. No free fluid is seen within the pelvic cul-de-sac. IMPRESSION: 1. Single live intrauterine pregnancy noted, with a crown-rump length of 5.0 cm, corresponding to a gestational age of [redacted] weeks 5 days. This matches the gestational age of [redacted] weeks 5 days by LMP, reflecting an estimated date of delivery of Dec 22, 2016. 2. Small amount of subchorionic hemorrhage noted. Electronically Signed   By: Roanna Raider M.D.   On: 05/31/2016 19:15    MAU Management/MDM: Ordered labs and reviewed results.  With normal neuro exam and labwork, no systemic cause found for visual disturbances.  Treated chlamydia with azithromycin 1000 mg PO in MAU today.  If abdominal pain or visual symptoms persist, return to MAU.  F/U with prenatal care, pt given new list of providers.  Pt stable at time of discharge.  ASSESSMENT 1. Abdominal pain affecting pregnancy   2. Chlamydia  infection affecting pregnancy in second trimester   3. Visual disturbance     PLAN Discharge home   Medication List    STOP taking these medications   cyclobenzaprine 10 MG tablet Commonly known as:  FLEXERIL   metroNIDAZOLE 500 MG tablet Commonly known as:  FLAGYL   promethazine 25 MG tablet Commonly known as:  PHENERGAN      Follow-up Information    [facility] Follow up.   Why:  Or see list of providers printed below. Return to MAU as needed for emergencies. Contact information: [address] [place] [address] [phone]          Sharen Counter Certified Nurse-Midwife 06/15/2016  6:44 PM

## 2016-07-09 ENCOUNTER — Inpatient Hospital Stay (HOSPITAL_COMMUNITY)
Admission: AD | Admit: 2016-07-09 | Discharge: 2016-07-09 | Disposition: A | Discharge status: Home / Self Care | Payer: Medicaid - Out of State | Source: Ambulatory Visit | Attending: Obstetrics and Gynecology | Admitting: Obstetrics and Gynecology

## 2016-07-09 ENCOUNTER — Encounter (HOSPITAL_COMMUNITY): Payer: Self-pay | Admitting: Certified Nurse Midwife

## 2016-07-09 DIAGNOSIS — R51 Headache: Secondary | ICD-10-CM

## 2016-07-09 DIAGNOSIS — Z3A17 17 weeks gestation of pregnancy: Secondary | ICD-10-CM | POA: Insufficient documentation

## 2016-07-09 DIAGNOSIS — N760 Acute vaginitis: Secondary | ICD-10-CM

## 2016-07-09 DIAGNOSIS — B9689 Other specified bacterial agents as the cause of diseases classified elsewhere: Secondary | ICD-10-CM

## 2016-07-09 DIAGNOSIS — O23592 Infection of other part of genital tract in pregnancy, second trimester: Secondary | ICD-10-CM

## 2016-07-09 DIAGNOSIS — O26892 Other specified pregnancy related conditions, second trimester: Secondary | ICD-10-CM

## 2016-07-09 DIAGNOSIS — Z880 Allergy status to penicillin: Secondary | ICD-10-CM | POA: Insufficient documentation

## 2016-07-09 DIAGNOSIS — R109 Unspecified abdominal pain: Secondary | ICD-10-CM

## 2016-07-09 DIAGNOSIS — R519 Headache, unspecified: Secondary | ICD-10-CM

## 2016-07-09 LAB — CBC WITH DIFFERENTIAL/PLATELET
BASOS ABS: 0 10*3/uL (ref 0.0–0.1)
BASOS PCT: 0 %
EOS ABS: 0.1 10*3/uL (ref 0.0–0.7)
EOS PCT: 2 %
HCT: 33.1 % — ABNORMAL LOW (ref 36.0–46.0)
HEMOGLOBIN: 11.7 g/dL — AB (ref 12.0–15.0)
LYMPHS ABS: 2.2 10*3/uL (ref 0.7–4.0)
Lymphocytes Relative: 41 %
MCH: 34.1 pg — ABNORMAL HIGH (ref 26.0–34.0)
MCHC: 35.3 g/dL (ref 30.0–36.0)
MCV: 96.5 fL (ref 78.0–100.0)
Monocytes Absolute: 0.2 10*3/uL (ref 0.1–1.0)
Monocytes Relative: 4 %
NEUTROS PCT: 53 %
Neutro Abs: 2.8 10*3/uL (ref 1.7–7.7)
PLATELETS: 257 10*3/uL (ref 150–400)
RBC: 3.43 MIL/uL — AB (ref 3.87–5.11)
RDW: 12.7 % (ref 11.5–15.5)
WBC: 5.3 10*3/uL (ref 4.0–10.5)

## 2016-07-09 LAB — URINALYSIS, ROUTINE W REFLEX MICROSCOPIC
Bilirubin Urine: NEGATIVE
GLUCOSE, UA: NEGATIVE mg/dL
Hgb urine dipstick: NEGATIVE
KETONES UR: NEGATIVE mg/dL
Nitrite: NEGATIVE
PH: 7 (ref 5.0–8.0)
Protein, ur: NEGATIVE mg/dL
SPECIFIC GRAVITY, URINE: 1.01 (ref 1.005–1.030)

## 2016-07-09 LAB — WET PREP, GENITAL
SPERM: NONE SEEN
TRICH WET PREP: NONE SEEN
Yeast Wet Prep HPF POC: NONE SEEN

## 2016-07-09 MED ORDER — BUTALBITAL-APAP-CAFFEINE 50-325-40 MG PO TABS
2.0000 | ORAL_TABLET | Freq: Once | ORAL | Status: AC
  Administered 2016-07-09: 2 via ORAL
  Filled 2016-07-09: qty 2

## 2016-07-09 MED ORDER — BUTALBITAL-APAP-CAFFEINE 50-325-40 MG PO TABS: 1.0000 | ORAL_TABLET | Freq: Four times a day (QID) | ORAL | 0 refills | Status: DC | PRN

## 2016-07-09 MED ORDER — METRONIDAZOLE 500 MG PO TABS: 500.0000 mg | ORAL_TABLET | Freq: Two times a day (BID) | ORAL | 0 refills | Status: DC

## 2016-07-09 NOTE — Discharge Instructions (Signed)

## 2016-07-09 NOTE — MAU Note (Signed)
Pt started having headaches x 3 days. Also having sharp abd pain in her lower abd that come and go. Having difficulty waling with pain.Taking tylenol without relief of either.

## 2016-07-09 NOTE — MAU Provider Note (Signed)
History     CSN: [id number]  Arrival date and time: 07/09/16 [id number]   First Provider Initiated Contact with Patient 07/09/16 1923      Chief Complaint  Patient presents with  . Abdominal Pain  . Headache   HPI [name] is a 20 y.o. [id number] at 1522w2d who presents to MAU today with complaint of abdominal pain and headache. The patient states headache x 3 days unrelieved with Tylenol. She rates her pain at 8/10 now. She states a history of migraine with a previous pregnancy. She states lower abdominal pain x 2 days also unrelieved by Tylenol. She denies bleeding, UTI symptoms, change in discharge or change in pain with ambulation or standing.   OB History    Gravida Para Term Preterm AB Living   3 2 1 1   2    SAB TAB Ectopic Multiple Live Births           2      Past Medical History:  Diagnosis Date  . Chlamydia   . Heart murmur     Past Surgical History:  Procedure Laterality Date  . NO PAST SURGERIES      Family History  Problem Relation Age of Onset  . Asthma Mother     Social History  Substance Use Topics  . Smoking status: Never Smoker  . Smokeless tobacco: Never Used  . Alcohol use No    Allergies:  Allergies  Allergen Reactions  . Kiwi Extract Swelling    Causes swelling of the mouth and throat.  Marland Kitchen. Penicillins Hives    Has patient had a PCN reaction causing immediate rash, facial/tongue/throat swelling, SOB or lightheadedness with hypotension: Yes Has patient had a PCN reaction causing severe rash involving mucus membranes or skin necrosis: No Has patient had a PCN reaction that required hospitalization Yes Has patient had a PCN reaction occurring within the last 10 years: No If all of the above answers are "NO", then may proceed with Cephalosporin use.     Prescriptions Prior to Admission  Medication Sig Dispense Refill Last Dose  . Prenatal Vit-Fe Fumarate-FA (PRENATAL MULTIVITAMIN) TABS tablet Take 1 tablet by mouth daily at 12 noon.    07/09/2016 at Unknown time    Review of Systems  Constitutional: Negative for fever and malaise/fatigue.  Gastrointestinal: Positive for abdominal pain. Negative for constipation, diarrhea, nausea and vomiting.  Genitourinary: Negative for dysuria, frequency and urgency.       Neg - vaginal bleeding, abnormal discharge   Physical Exam   Blood pressure 108/65, pulse 89, temperature 98.6 F (37 C), temperature source Oral, resp. rate 18, height 5\' 4"  (1.626 m), weight 130 lb (59 kg), unknown if currently breastfeeding.  Physical Exam  Nursing note and vitals reviewed. Constitutional: She is oriented to person, place, and time. She appears well-developed and well-nourished. No distress.  HENT:  Head: Normocephalic and atraumatic.  Cardiovascular: Normal rate.   Respiratory: Effort normal.  GI: Soft. She exhibits no distension and no mass. There is tenderness (mild tenderness to palpation of the suprapubic region at midline). There is no rebound and no guarding.  Genitourinary: Uterus is enlarged. Uterus is not tender. Cervix exhibits no motion tenderness. No bleeding in the vagina. Vaginal discharge (small amount of thin, white discharge noted) found.  Neurological: She is alert and oriented to person, place, and time.  Skin: Skin is warm and dry. No erythema.  Psychiatric: She has a normal mood and affect.  Dilation: Closed Effacement (%):  Thick Cervical Position: Posterior Exam by:: Harlon FlorJ. Unknown Schleyer, PA-C   Results for orders placed or performed during the hospital encounter of 07/09/16 (from the past 24 hour(s))  Urinalysis, Routine w reflex microscopic     Status: Abnormal   Collection Time: 07/09/16  7:05 PM  Result Value Ref Range   Color, Urine YELLOW YELLOW   APPearance HAZY (A) CLEAR   Specific Gravity, Urine 1.010 1.005 - 1.030   pH 7.0 5.0 - 8.0   Glucose, UA NEGATIVE NEGATIVE mg/dL   Hgb urine dipstick NEGATIVE NEGATIVE   Bilirubin Urine NEGATIVE NEGATIVE   Ketones, ur  NEGATIVE NEGATIVE mg/dL   Protein, ur NEGATIVE NEGATIVE mg/dL   Nitrite NEGATIVE NEGATIVE   Leukocytes, UA SMALL (A) NEGATIVE   RBC / HPF 0-5 0 - 5 RBC/hpf   WBC, UA 0-5 0 - 5 WBC/hpf   Bacteria, UA RARE (A) NONE SEEN   Squamous Epithelial / LPF 6-30 (A) NONE SEEN   Mucous PRESENT   CBC with Differential/Platelet     Status: Abnormal   Collection Time: 07/09/16  7:41 PM  Result Value Ref Range   WBC 5.3 4.0 - 10.5 K/uL   RBC 3.43 (L) 3.87 - 5.11 MIL/uL   Hemoglobin 11.7 (L) 12.0 - 15.0 g/dL   HCT 16.133.1 (L) 09.636.0 - 04.546.0 %   MCV 96.5 78.0 - 100.0 fL   MCH 34.1 (H) 26.0 - 34.0 pg   MCHC 35.3 30.0 - 36.0 g/dL   RDW 40.912.7 81.111.5 - 91.415.5 %   Platelets 257 150 - 400 K/uL   Neutrophils Relative % 53 %   Neutro Abs 2.8 1.7 - 7.7 K/uL   Lymphocytes Relative 41 %   Lymphs Abs 2.2 0.7 - 4.0 K/uL   Monocytes Relative 4 %   Monocytes Absolute 0.2 0.1 - 1.0 K/uL   Eosinophils Relative 2 %   Eosinophils Absolute 0.1 0.0 - 0.7 K/uL   Basophils Relative 0 %   Basophils Absolute 0.0 0.0 - 0.1 K/uL  Wet prep, genital     Status: Abnormal   Collection Time: 07/09/16  7:52 PM  Result Value Ref Range   Yeast Wet Prep HPF POC NONE SEEN NONE SEEN   Trich, Wet Prep NONE SEEN NONE SEEN   Clue Cells Wet Prep HPF POC PRESENT (A) NONE SEEN   WBC, Wet Prep HPF POC MODERATE (A) NONE SEEN   Sperm NONE SEEN     MAU Course  Procedures None  MDM FHR - 150 bpm with doppler UA, wet prep, GC/Chlamydia, CBC today  Fioricet given PO for headache  Assessment and Plan  A: SIUP at 6053w2d Bacterial Vaginosis  Headache  Abdominal pain in pregnancy, second trimester   P: Discharge home Rx for Flagyl and Fioricet given to patient  Second trimester warning signs discussed Patient advised to follow-up with [facility] as scheduled for routine prenatal care or sooner PRN Patient may return to MAU as needed or if her condition were to change or worsen   Marny LowensteinJulie N Twilla Khouri, PA-C  07/09/2016, 8:24 PM

## 2016-07-10 LAB — HIV ANTIBODY (ROUTINE TESTING W REFLEX): HIV Screen 4th Generation wRfx: NONREACTIVE

## 2016-07-10 LAB — GC/CHLAMYDIA PROBE AMP (~~LOC~~) NOT AT ARMC
CHLAMYDIA, DNA PROBE: NEGATIVE
NEISSERIA GONORRHEA: NEGATIVE

## 2016-07-10 LAB — RPR: RPR: NONREACTIVE

## 2016-07-21 ENCOUNTER — Encounter: Payer: Self-pay | Admitting: Obstetrics and Gynecology

## 2016-07-21 ENCOUNTER — Ambulatory Visit (INDEPENDENT_AMBULATORY_CARE_PROVIDER_SITE_OTHER): Payer: Self-pay | Admitting: Obstetrics and Gynecology

## 2016-07-21 VITALS — BP 110/67 | HR 88 | Wt 134.3 lb

## 2016-07-21 DIAGNOSIS — Z3490 Encounter for supervision of normal pregnancy, unspecified, unspecified trimester: Secondary | ICD-10-CM | POA: Insufficient documentation

## 2016-07-21 DIAGNOSIS — O26899 Other specified pregnancy related conditions, unspecified trimester: Secondary | ICD-10-CM

## 2016-07-21 DIAGNOSIS — Z3482 Encounter for supervision of other normal pregnancy, second trimester: Secondary | ICD-10-CM

## 2016-07-21 DIAGNOSIS — Z6791 Unspecified blood type, Rh negative: Secondary | ICD-10-CM

## 2016-07-21 DIAGNOSIS — Z3480 Encounter for supervision of other normal pregnancy, unspecified trimester: Secondary | ICD-10-CM

## 2016-07-21 DIAGNOSIS — B009 Herpesviral infection, unspecified: Secondary | ICD-10-CM

## 2016-07-21 NOTE — Progress Notes (Signed)
Pt up to date on papsmear, normal last year. Anatomy Scan Scheduled for 07/24/16

## 2016-07-21 NOTE — Patient Instructions (Signed)
Second Trimester of Pregnancy The second trimester is from week 13 through week 28, month 4 through 6. This is often the time in pregnancy that you feel your best. Often times, morning sickness has lessened or quit. You may have more energy, and you may get hungry more often. Your unborn baby (fetus) is growing rapidly. At the end of the sixth month, he or she is about 9 inches long and weighs about 1 pounds. You will likely feel the baby move (quickening) between 18 and 20 weeks of pregnancy. Follow these instructions at home:  Avoid all smoking, herbs, and alcohol. Avoid drugs not approved by your doctor.  Do not use any tobacco products, including cigarettes, chewing tobacco, and electronic cigarettes. If you need help quitting, ask your doctor. You may get counseling or other support to help you quit.  Only take medicine as told by your doctor. Some medicines are safe and some are not during pregnancy.  Exercise only as told by your doctor. Stop exercising if you start having cramps.  Eat regular, healthy meals.  Wear a good support bra if your breasts are tender.  Do not use hot tubs, steam rooms, or saunas.  Wear your seat belt when driving.  Avoid raw meat, uncooked cheese, and liter boxes and soil used by cats.  Take your prenatal vitamins.  Take 1500-2000 milligrams of calcium daily starting at the 20th week of pregnancy until you deliver your baby.  Try taking medicine that helps you poop (stool softener) as needed, and if your doctor approves. Eat more fiber by eating fresh fruit, vegetables, and whole grains. Drink enough fluids to keep your pee (urine) clear or pale yellow.  Take warm water baths (sitz baths) to soothe pain or discomfort caused by hemorrhoids. Use hemorrhoid cream if your doctor approves.  If you have puffy, bulging veins (varicose veins), wear support hose. Raise (elevate) your feet for 15 minutes, 3-4 times a day. Limit salt in your diet.  Avoid heavy  lifting, wear low heals, and sit up straight.  Rest with your legs raised if you have leg cramps or low back pain.  Visit your dentist if you have not gone during your pregnancy. Use a soft toothbrush to brush your teeth. Be gentle when you floss.  You can have sex (intercourse) unless your doctor tells you not to.  Go to your doctor visits. Get help if:  You feel dizzy.  You have mild cramps or pressure in your lower belly (abdomen).  You have a nagging pain in your belly area.  You continue to feel sick to your stomach (nauseous), throw up (vomit), or have watery poop (diarrhea).  You have bad smelling fluid coming from your vagina.  You have pain with peeing (urination). Get help right away if:  You have a fever.  You are leaking fluid from your vagina.  You have spotting or bleeding from your vagina.  You have severe belly cramping or pain.  You lose or gain weight rapidly.  You have trouble catching your breath and have chest pain.  You notice sudden or extreme puffiness (swelling) of your face, hands, ankles, feet, or legs.  You have not felt the baby move in over an hour.  You have severe headaches that do not go away with medicine.  You have vision changes. This information is not intended to replace advice given to you by your health care provider. Make sure you discuss any questions you have with your health care   provider. Document Released: 10/14/2009 Document Revised: 12/26/2015 Document Reviewed: 09/20/2012 Elsevier Interactive Patient Education  2017 [employer]  

## 2016-07-21 NOTE — Progress Notes (Signed)
   PRENATAL VISIT NOTE  Subjective:  Brandy [name] is a 20 y.o. [id number] at 22527w0d being seen today for inital prenatal care.  She is currently monitored for the following issues for this low-risk pregnancy and has HSV-2 (herpes simplex virus 2) infection and Supervision of normal pregnancy on her problem list.  Patient reports no complaints.   . Vag. Bleeding: None.  Movement: Absent. Denies leaking of fluid.   The following portions of the patient's history were reviewed and updated as appropriate: allergies, current medications, past family history, past medical history, past social history, past surgical history and problem list. Problem list updated.  Objective:   Vitals:   07/21/16 0928  BP: 110/67  Pulse: 88  Weight: 134 lb 4.8 oz (60.9 kg)    Fetal Status: Fetal Heart Rate (bpm): 140   Movement: Absent     General:  Alert, oriented and cooperative. Patient is in no acute distress.  Skin: Skin is warm and dry. No rash noted.   Cardiovascular: Normal heart rate noted, no /m/g  Respiratory: Normal respiratory effort, no problems with respiration noted, CTAB  Abdomen: Soft, gravid, appropriate for gestational age. Pain/Pressure: Present     Pelvic:  Cervical exam deferred        Extremities: Normal range of motion.  Edema: None  Mental Status: Normal mood and affect. Normal behavior. Normal judgment and thought content.   Assessment and Plan:  Pregnancy: G3P1102 at 322w0d  1. Encounter for supervision of other normal pregnancy in second trimester -Had baby 1 yr ago, normal vagianl delivery and pregnancy - US MFM OB COMP + 14 WK; Future - Prenatal Profile - AFP, Quad Screen -declines flu vaccine  3. HSV-2 (herpes simplex virus 2) infection Plan for prophylaxis at 34-35 wks. No recent out breaks.  Preterm labor symptoms and general obstetric precautions including but not limited to vaginal bleeding, contractions, leaking of fluid and fetal movement were reviewed in detail  with the patient. Please refer to After Visit Summary for other counseling recommendations.  No Follow-up on file.   Lorne SkeensNicholas Michael Jacqualine Weichel, MD

## 2016-07-22 LAB — PRENATAL PROFILE (SOLSTAS)
Antibody Screen: POSITIVE — AB
BASOS PCT: 0 %
Basophils Absolute: 0 cells/uL (ref 0–200)
EOS ABS: 90 {cells}/uL (ref 15–500)
Eosinophils Relative: 2 %
HCT: 35.2 % (ref 35.0–45.0)
HIV: NONREACTIVE
Hemoglobin: 11.3 g/dL — ABNORMAL LOW (ref 11.7–15.5)
Hepatitis B Surface Ag: NEGATIVE
LYMPHS PCT: 41 %
Lymphs Abs: 1845 cells/uL (ref 850–3900)
MCH: 32.9 pg (ref 27.0–33.0)
MCHC: 32.1 g/dL (ref 32.0–36.0)
MCV: 102.6 fL — AB (ref 80.0–100.0)
MONO ABS: 315 {cells}/uL (ref 200–950)
MPV: 9.9 fL (ref 7.5–12.5)
Monocytes Relative: 7 %
NEUTROS PCT: 50 %
Neutro Abs: 2250 cells/uL (ref 1500–7800)
PLATELETS: 298 10*3/uL (ref 140–400)
RBC: 3.43 MIL/uL — ABNORMAL LOW (ref 3.80–5.10)
RDW: 13 % (ref 11.0–15.0)
RH TYPE: NEGATIVE
Rubella: 3.47 Index — ABNORMAL HIGH (ref <0.90)
WBC: 4.5 10*3/uL (ref 3.8–10.8)

## 2016-07-22 LAB — AFP, QUAD SCREEN
AFP: 39.7 ng/mL
Age Alone: 1:1130 {titer}
CURR GEST AGE: 19 wk
Down Syndrome Scr Risk Est: 1:4220 {titer}
HCG, Total: 25.87 IU/mL
INH: 146.8 pg/mL
Interpretation-AFP: NEGATIVE
MOM FOR AFP: 0.66
MOM FOR INH: 0.82
MoM for hCG: 0.96
Open Spina bifida: NEGATIVE
Tri 18 Scr Risk Est: NEGATIVE
Trisomy 18 (Edward) Syndrome Interp.: 1:49000 {titer}
UE3 MOM: 1.14
uE3 Value: 1.73 ng/mL

## 2016-07-22 LAB — PRENATAL ANTIBODY IDENTIFICATION

## 2016-07-22 LAB — ANTIBODY TITER (PRENATAL TITER): AB TITER: 8

## 2016-07-24 ENCOUNTER — Ambulatory Visit (HOSPITAL_COMMUNITY)
Admission: RE | Admit: 2016-07-24 | Discharge: 2016-07-24 | Disposition: A | Discharge status: Home / Self Care | Payer: Medicaid Other | Source: Ambulatory Visit | Attending: Obstetrics and Gynecology | Admitting: Obstetrics and Gynecology

## 2016-07-24 DIAGNOSIS — B009 Herpesviral infection, unspecified: Secondary | ICD-10-CM | POA: Insufficient documentation

## 2016-07-24 DIAGNOSIS — Z3A19 19 weeks gestation of pregnancy: Secondary | ICD-10-CM | POA: Insufficient documentation

## 2016-07-24 DIAGNOSIS — Z3482 Encounter for supervision of other normal pregnancy, second trimester: Secondary | ICD-10-CM

## 2016-07-24 DIAGNOSIS — O98512 Other viral diseases complicating pregnancy, second trimester: Secondary | ICD-10-CM | POA: Insufficient documentation

## 2016-07-24 DIAGNOSIS — Z363 Encounter for antenatal screening for malformations: Secondary | ICD-10-CM | POA: Diagnosis present

## 2016-08-03 NOTE — L&D Delivery Note (Signed)
Delivery Note At 1:13 PM a viable female was delivered via  (Presentation: ROA).  APGAR: 7,9 ; weight pending  .   Placenta status: Spontaneous, intact.  Cord: 3 vessels.  Anesthesia:  Epidural   Episiotomy:  N/a Lacerations:  none Est. Blood Loss (mL):  10cc  Mom to postpartum.  Baby to Couplet care / Skin to Skin.  Cleone Slim 11/22/2016, 1:21 PM

## 2016-08-06 ENCOUNTER — Other Ambulatory Visit: Payer: Self-pay | Admitting: *Deleted

## 2016-08-06 DIAGNOSIS — Z3482 Encounter for supervision of other normal pregnancy, second trimester: Secondary | ICD-10-CM

## 2016-08-19 ENCOUNTER — Encounter: Payer: Medicaid Other | Admitting: Family Medicine

## 2016-08-25 ENCOUNTER — Encounter (HOSPITAL_COMMUNITY): Payer: Self-pay

## 2016-08-25 ENCOUNTER — Inpatient Hospital Stay (HOSPITAL_COMMUNITY)
Admission: AD | Admit: 2016-08-25 | Discharge: 2016-08-25 | Disposition: A | Discharge status: Home / Self Care | Payer: Medicaid Other | Source: Ambulatory Visit | Attending: Family Medicine | Admitting: Family Medicine

## 2016-08-25 ENCOUNTER — Other Ambulatory Visit: Payer: Self-pay | Admitting: Student

## 2016-08-25 DIAGNOSIS — O26892 Other specified pregnancy related conditions, second trimester: Secondary | ICD-10-CM | POA: Insufficient documentation

## 2016-08-25 DIAGNOSIS — Z3A24 24 weeks gestation of pregnancy: Secondary | ICD-10-CM | POA: Diagnosis not present

## 2016-08-25 DIAGNOSIS — N760 Acute vaginitis: Secondary | ICD-10-CM | POA: Diagnosis not present

## 2016-08-25 DIAGNOSIS — O23592 Infection of other part of genital tract in pregnancy, second trimester: Secondary | ICD-10-CM

## 2016-08-25 DIAGNOSIS — Z3480 Encounter for supervision of other normal pregnancy, unspecified trimester: Secondary | ICD-10-CM

## 2016-08-25 DIAGNOSIS — B9689 Other specified bacterial agents as the cause of diseases classified elsewhere: Secondary | ICD-10-CM | POA: Insufficient documentation

## 2016-08-25 DIAGNOSIS — R102 Pelvic and perineal pain: Secondary | ICD-10-CM | POA: Diagnosis not present

## 2016-08-25 DIAGNOSIS — Z79899 Other long term (current) drug therapy: Secondary | ICD-10-CM | POA: Diagnosis not present

## 2016-08-25 DIAGNOSIS — R109 Unspecified abdominal pain: Secondary | ICD-10-CM | POA: Insufficient documentation

## 2016-08-25 LAB — URINALYSIS, ROUTINE W REFLEX MICROSCOPIC
Bilirubin Urine: NEGATIVE
GLUCOSE, UA: NEGATIVE mg/dL
HGB URINE DIPSTICK: NEGATIVE
KETONES UR: NEGATIVE mg/dL
NITRITE: NEGATIVE
PH: 7 (ref 5.0–8.0)
PROTEIN: NEGATIVE mg/dL
Specific Gravity, Urine: 1.011 (ref 1.005–1.030)

## 2016-08-25 LAB — WET PREP, GENITAL
SPERM: NONE SEEN
TRICH WET PREP: NONE SEEN
Yeast Wet Prep HPF POC: NONE SEEN

## 2016-08-25 MED ORDER — METRONIDAZOLE 500 MG PO TABS: 500.0000 mg | ORAL_TABLET | Freq: Three times a day (TID) | ORAL | 0 refills | Status: DC

## 2016-08-25 MED ORDER — IBUPROFEN 800 MG PO TABS
400.0000 mg | ORAL_TABLET | Freq: Once | ORAL | Status: AC
  Administered 2016-08-25: 400 mg via ORAL
  Filled 2016-08-25: qty 1

## 2016-08-25 MED ORDER — METRONIDAZOLE 500 MG PO TABS: 500.0000 mg | ORAL_TABLET | Freq: Two times a day (BID) | ORAL | 0 refills | Status: DC
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
## 2016-08-25 NOTE — Discharge Instructions (Signed)
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
## 2016-08-26 LAB — CULTURE, OB URINE

## 2016-08-26 LAB — GC/CHLAMYDIA PROBE AMP (~~LOC~~) NOT AT ARMC
Chlamydia: POSITIVE — AB
NEISSERIA GONORRHEA: NEGATIVE

## 2016-08-27 ENCOUNTER — Telehealth (HOSPITAL_COMMUNITY): Payer: Self-pay | Admitting: *Deleted

## 2016-08-27 ENCOUNTER — Telehealth: Payer: Self-pay | Admitting: Student

## 2016-08-27 NOTE — Telephone Encounter (Signed)
Attempted to contact pt regarding + chlamydia results. Female answered phone stating pt had not been there in a week & they have no other contact number for her. Left department phone number for patient to call if she hears from her.

## 2016-08-31 ENCOUNTER — Other Ambulatory Visit: Payer: Self-pay | Admitting: Obstetrics and Gynecology

## 2016-08-31 ENCOUNTER — Ambulatory Visit (HOSPITAL_COMMUNITY)
Admission: RE | Admit: 2016-08-31 | Discharge: 2016-08-31 | Disposition: A | Discharge status: Home / Self Care | Payer: Medicaid Other | Source: Ambulatory Visit | Attending: Obstetrics and Gynecology | Admitting: Obstetrics and Gynecology

## 2016-08-31 DIAGNOSIS — Z362 Encounter for other antenatal screening follow-up: Secondary | ICD-10-CM | POA: Insufficient documentation

## 2016-08-31 DIAGNOSIS — B009 Herpesviral infection, unspecified: Secondary | ICD-10-CM | POA: Diagnosis not present

## 2016-08-31 DIAGNOSIS — Z3482 Encounter for supervision of other normal pregnancy, second trimester: Secondary | ICD-10-CM

## 2016-08-31 DIAGNOSIS — Z3A24 24 weeks gestation of pregnancy: Secondary | ICD-10-CM | POA: Diagnosis not present

## 2016-08-31 DIAGNOSIS — O98519 Other viral diseases complicating pregnancy, unspecified trimester: Secondary | ICD-10-CM | POA: Diagnosis not present

## 2016-09-03 ENCOUNTER — Encounter: Payer: Self-pay | Admitting: Family

## 2016-09-03 ENCOUNTER — Encounter: Payer: Self-pay | Admitting: General Practice

## 2016-09-19 ENCOUNTER — Encounter (HOSPITAL_COMMUNITY): Payer: Self-pay

## 2016-09-19 ENCOUNTER — Inpatient Hospital Stay (HOSPITAL_COMMUNITY)
Admission: AD | Admit: 2016-09-19 | Discharge: 2016-09-20 | Disposition: A | Discharge status: Home / Self Care | Payer: Medicaid Other | Source: Ambulatory Visit | Attending: Obstetrics and Gynecology | Admitting: Obstetrics and Gynecology

## 2016-09-19 DIAGNOSIS — Z79899 Other long term (current) drug therapy: Secondary | ICD-10-CM | POA: Insufficient documentation

## 2016-09-19 DIAGNOSIS — Z3A27 27 weeks gestation of pregnancy: Secondary | ICD-10-CM | POA: Diagnosis not present

## 2016-09-19 DIAGNOSIS — O26892 Other specified pregnancy related conditions, second trimester: Secondary | ICD-10-CM | POA: Diagnosis not present

## 2016-09-19 DIAGNOSIS — O219 Vomiting of pregnancy, unspecified: Secondary | ICD-10-CM

## 2016-09-19 DIAGNOSIS — O212 Late vomiting of pregnancy: Secondary | ICD-10-CM | POA: Insufficient documentation

## 2016-09-19 DIAGNOSIS — R109 Unspecified abdominal pain: Secondary | ICD-10-CM | POA: Insufficient documentation

## 2016-09-19 DIAGNOSIS — A749 Chlamydial infection, unspecified: Secondary | ICD-10-CM

## 2016-09-19 DIAGNOSIS — O98812 Other maternal infectious and parasitic diseases complicating pregnancy, second trimester: Secondary | ICD-10-CM

## 2016-09-19 LAB — URINALYSIS, ROUTINE W REFLEX MICROSCOPIC
BILIRUBIN URINE: NEGATIVE
Glucose, UA: NEGATIVE mg/dL
Hgb urine dipstick: NEGATIVE
Ketones, ur: 20 mg/dL — AB
Leukocytes, UA: NEGATIVE
NITRITE: NEGATIVE
PH: 5 (ref 5.0–8.0)
Protein, ur: NEGATIVE mg/dL
SPECIFIC GRAVITY, URINE: 1.026 (ref 1.005–1.030)

## 2016-09-19 MED ORDER — ACETAMINOPHEN 325 MG PO TABS
650.0000 mg | ORAL_TABLET | Freq: Once | ORAL | Status: AC
  Administered 2016-09-19: 650 mg via ORAL
  Filled 2016-09-19: qty 2

## 2016-09-19 MED ORDER — ONDANSETRON HCL 4 MG PO TABS: 4.0000 mg | ORAL_TABLET | Freq: Three times a day (TID) | ORAL | 0 refills | Status: DC | PRN

## 2016-09-19 MED ORDER — ONDANSETRON 4 MG PO TBDP
4.0000 mg | ORAL_TABLET | Freq: Once | ORAL | Status: AC
  Administered 2016-09-19: 4 mg via ORAL
  Filled 2016-09-19: qty 1

## 2016-09-19 MED ORDER — SODIUM CHLORIDE 0.9 % IV BOLUS (SEPSIS)
1000.0000 mL | Freq: Once | INTRAVENOUS | Status: AC
  Administered 2016-09-19: 1000 mL via INTRAVENOUS

## 2016-09-19 MED ORDER — AZITHROMYCIN 250 MG PO TABS
1000.0000 mg | ORAL_TABLET | Freq: Once | ORAL | Status: AC
  Administered 2016-09-19: 1000 mg via ORAL
  Filled 2016-09-19: qty 4

## 2016-09-19 NOTE — MAU Provider Note (Signed)
**Note Brandy-Identified via Obfuscation** History     CSN: [id number]  Arrival date and time: 09/19/16 2049   First Provider Initiated Contact with Patient 09/19/16 2144     Chief Complaint  Patient presents with  . Emesis During Pregnancy  . Dizziness  . Abdominal Pain   HPI  Brandy [name] is 21 y.o. [id number] female at [redacted]w[redacted]d who presents with nausea/vomiting x36 hours. Reports vomiting every 20-60 minutes. Has been trying to drink Pedialyte and water but keeps vomiting it back up.  Endorses abdominal pain with vomiting and some intermittent cramping. Denies diarrhea. Endorsing headache. No cough/cold symptoms. Afebrile. No sick contacts.   Of note, patient with +Chlamydia test on 1/25. Per chart review, unable to contact patient with results and she was unaware of positive test result.   OB History    Gravida Para Term Preterm AB Living   3 2 1 1   2    SAB TAB Ectopic Multiple Live Births           2      Past Medical History:  Diagnosis Date  . Chlamydia   . Heart murmur     Past Surgical History:  Procedure Laterality Date  . NO PAST SURGERIES      Family History  Problem Relation Age of Onset  . Asthma Mother     Social History  Substance Use Topics  . Smoking status: Never Smoker  . Smokeless tobacco: Never Used  . Alcohol use No    Allergies:  Allergies  Allergen Reactions  . Kiwi Extract Swelling    Causes swelling of the mouth and throat.  Marland Kitchen Penicillins Hives    Has patient had a PCN reaction causing immediate rash, facial/tongue/throat swelling, SOB or lightheadedness with hypotension: Yes Has patient had a PCN reaction causing severe rash involving mucus membranes or skin necrosis: No Has patient had a PCN reaction that required hospitalization Yes Has patient had a PCN reaction occurring within the last 10 years: No If all of the above answers are "NO", then may proceed with Cephalosporin use.   Josefina Do Allergy] Rash    Prescriptions Prior to Admission  Medication  Sig Dispense Refill Last Dose  . acetaminophen (TYLENOL) 325 MG tablet Take 650 mg by mouth every 6 (six) hours as needed for mild pain or headache.   09/19/2016 at Unknown time  . Prenatal Vit-Fe Fumarate-FA (PRENATAL MULTIVITAMIN) TABS tablet Take 1 tablet by mouth daily at 12 noon.   09/19/2016 at Unknown time  . metroNIDAZOLE (FLAGYL) 500 MG tablet Take 1 tablet (500 mg total) by mouth 2 (two) times daily. 14 tablet 0     Review of Systems  Constitutional: Negative for chills and fever.  HENT: Negative for congestion, ear pain, postnasal drip, rhinorrhea and sore throat.   Respiratory: Negative for shortness of breath.   Cardiovascular: Negative for chest pain.  Gastrointestinal: Positive for abdominal pain, nausea and vomiting. Negative for constipation and diarrhea.  Genitourinary: Negative for dysuria and vaginal discharge.  Neurological: Positive for dizziness and headaches.   Physical Exam   Blood pressure 99/60, pulse 105, temperature 99.6 F (37.6 C), temperature source Oral, resp. rate 16, height 5\' 4"  (1.626 m), weight 65.1 kg (143 lb 9.6 oz), SpO2 100 %, unknown if currently breastfeeding.  Physical Exam  Constitutional: She appears well-developed and well-nourished. No distress.  HENT:  Dry lips and dry MMM.   Cardiovascular: Regular rhythm.   Tachycardic.   Respiratory: Effort normal and breath sounds normal. No  respiratory distress.  GI: Soft. There is tenderness. There is no rebound and no guarding.  Gravid consistent with GA.     MAU Course  Procedures  MDM NST reviewed and reactive. Toco without ctx. Mild uterine irritability present.   Assessment and Plan  Brandy [name] is 21 y.o. [id number] female at 5534w5d who presented with abdominal pain, nausea, and vomiting. Patient with untreated +Chlamydia test on 08/27/16. Patient given IVFs, Zofran, and Tylenol during MAU course. Treated with Azithromycin for +Chlamydia.  -Rx for Zofran  -will need TOC for Chlamydia   -return precautions discussed   Brandy HollingsheadCatherine L [name] 09/19/2016, 9:21 PM   I was present for the exam and agree with above.  Brandy KinsmanVirginia Orton [name], CNM 09/20/2016 9:00 AM

## 2016-09-19 NOTE — MAU Note (Signed)
Vomiting for 2 days. No diarrhea. Tonight I have h/a and dizzy. More I throw up the more my stomach hurts (lower abd pain). No vag bleeding. Some white vag d/c

## 2016-09-19 NOTE — Discharge Instructions (Signed)
Do not have sexual intercourse for one week following treatment and until after your partners have been treated.  Take the Zofran as needed for nausea. If you continue to have vomiting and can't keep fluids down please return.

## 2016-09-23 ENCOUNTER — Encounter: Payer: Self-pay | Admitting: Advanced Practice Midwife

## 2016-09-28 ENCOUNTER — Ambulatory Visit (INDEPENDENT_AMBULATORY_CARE_PROVIDER_SITE_OTHER): Payer: Medicaid Other | Admitting: Obstetrics and Gynecology

## 2016-09-28 VITALS — BP 104/66 | HR 77 | Wt 148.3 lb

## 2016-09-28 DIAGNOSIS — Z3480 Encounter for supervision of other normal pregnancy, unspecified trimester: Secondary | ICD-10-CM

## 2016-09-28 DIAGNOSIS — Z3482 Encounter for supervision of other normal pregnancy, second trimester: Secondary | ICD-10-CM | POA: Diagnosis present

## 2016-09-28 DIAGNOSIS — Z23 Encounter for immunization: Secondary | ICD-10-CM

## 2016-09-28 DIAGNOSIS — Z2913 Encounter for prophylactic Rho(D) immune globulin: Secondary | ICD-10-CM

## 2016-09-28 DIAGNOSIS — O360930 Maternal care for other rhesus isoimmunization, third trimester, not applicable or unspecified: Secondary | ICD-10-CM

## 2016-09-28 MED ORDER — RHO D IMMUNE GLOBULIN 1500 UNIT/2ML IJ SOSY
300.0000 ug | PREFILLED_SYRINGE | Freq: Once | INTRAMUSCULAR | Status: AC
  Administered 2016-09-28: 300 ug via INTRAMUSCULAR

## 2016-09-28 MED ORDER — RHO D IMMUNE GLOBULIN 1500 UNIT/2ML IJ SOSY: 300.0000 ug | PREFILLED_SYRINGE | Freq: Once | INTRAMUSCULAR | 0 refills | Status: DC

## 2016-09-28 NOTE — Patient Instructions (Addendum)
Third Trimester of Pregnancy The third trimester is from week 29 through week 40 (months 7 through 9). The third trimester is a time when the unborn baby (fetus) is growing rapidly. At the end of the ninth month, the fetus is about 20 inches in length and weighs 6-10 pounds. Body changes during your third trimester Your body goes through many changes during pregnancy. The changes vary from woman to woman. During the third trimester:  Your weight will continue to increase. You can expect to gain 25-35 pounds (11-16 kg) by the end of the pregnancy.  You may begin to get stretch marks on your hips, abdomen, and breasts.  You may urinate more often because the fetus is moving lower into your pelvis and pressing on your bladder.  You may develop or continue to have heartburn. This is caused by increased hormones that slow down muscles in the digestive tract.  You may develop or continue to have constipation because increased hormones slow digestion and cause the muscles that push waste through your intestines to relax.  You may develop hemorrhoids. These are swollen veins (varicose veins) in the rectum that can itch or be painful.  You may develop swollen, bulging veins (varicose veins) in your legs.  You may have increased body aches in the pelvis, back, or thighs. This is due to weight gain and increased hormones that are relaxing your joints.  You may have changes in your hair. These can include thickening of your hair, rapid growth, and changes in texture. Some women also have hair loss during or after pregnancy, or hair that feels dry or thin. Your hair will most likely return to normal after your baby is born.  Your breasts will continue to grow and they will continue to become tender. A yellow fluid (colostrum) may leak from your breasts. This is the first milk you are producing for your baby.  Your belly button may stick out.  You may notice more swelling in your hands, face, or  ankles.  You may have increased tingling or numbness in your hands, arms, and legs. The skin on your belly may also feel numb.  You may feel short of breath because of your expanding uterus.  You may have more problems sleeping. This can be caused by the size of your belly, increased need to urinate, and an increase in your body's metabolism.  You may notice the fetus "dropping," or moving lower in your abdomen.  You may have increased vaginal discharge.  Your cervix becomes thin and soft (effaced) near your due date. What to expect at prenatal visits You will have prenatal exams every 2 weeks until week 36. Then you will have weekly prenatal exams. During a routine prenatal visit:  You will be weighed to make sure you and the fetus are growing normally.  Your blood pressure will be taken.  Your abdomen will be measured to track your baby's growth.  The fetal heartbeat will be listened to.  Any test results from the previous visit will be discussed.  You may have a cervical check near your due date to see if you have effaced. At around 36 weeks, your health care provider will check your cervix. At the same time, your health care provider will also perform a test on the secretions of the vaginal tissue. This test is to determine if a type of bacteria, Group B streptococcus, is present. Your health care provider will explain this further. Your health care provider may ask you:    What your birth plan is.  How you are feeling.  If you are feeling the baby move.  If you have had any abnormal symptoms, such as leaking fluid, bleeding, severe headaches, or abdominal cramping.  If you are using any tobacco products, including cigarettes, chewing tobacco, and electronic cigarettes.  If you have any questions. Other tests or screenings that may be performed during your third trimester include:  Blood tests that check for low iron levels (anemia).  Fetal testing to check the health,  activity level, and growth of the fetus. Testing is done if you have certain medical conditions or if there are problems during the pregnancy.  Nonstress test (NST). This test checks the health of your baby to make sure there are no signs of problems, such as the baby not getting enough oxygen. During this test, a belt is placed around your belly. The baby is made to move, and its heart rate is monitored during movement. What is false labor? False labor is a condition in which you feel small, irregular tightenings of the muscles in the womb (contractions) that eventually go away. These are called Braxton Hicks contractions. Contractions may last for hours, days, or even weeks before true labor sets in. If contractions come at regular intervals, become more frequent, increase in intensity, or become painful, you should see your health care provider. What are the signs of labor?  Abdominal cramps.  Regular contractions that start at 10 minutes apart and become stronger and more frequent with time.  Contractions that start on the top of the uterus and spread down to the lower abdomen and back.  Increased pelvic pressure and dull back pain.  A watery or bloody mucus discharge that comes from the vagina.  Leaking of amniotic fluid. This is also known as your "water breaking." It could be a slow trickle or a gush. Let your doctor know if it has a color or strange odor. If you have any of these signs, call your health care provider right away, even if it is before your due date. Follow these instructions at home: Eating and drinking  Continue to eat regular, healthy meals.  Do not eat:  Raw meat or meat spreads.  Unpasteurized milk or cheese.  Unpasteurized juice.  Store-made salad.  Refrigerated smoked seafood.  Hot dogs or deli meat, unless they are piping hot.  More than 6 ounces of albacore tuna a week.  Shark, swordfish, king mackerel, or tile fish.  Store-made salads.  Raw  sprouts, such as mung bean or alfalfa sprouts.  Take prenatal vitamins as told by your health care provider.  Take 1000 mg of calcium daily as told by your health care provider.  If you develop constipation:  Take over-the-counter or prescription medicines.  Drink enough fluid to keep your urine clear or pale yellow.  Eat foods that are high in fiber, such as fresh fruits and vegetables, whole grains, and beans.  Limit foods that are high in fat and processed sugars, such as fried and sweet foods. Activity  Exercise only as directed by your health care provider. Healthy pregnant women should aim for 2 hours and 30 minutes of moderate exercise per week. If you experience any pain or discomfort while exercising, stop.  Avoid heavy lifting.  Do not exercise in extreme heat or humidity, or at high altitudes.  Wear low-heel, comfortable shoes.  Practice good posture.  Do not travel far distances unless it is absolutely necessary and only with the approval   of your health care provider.  Wear your seat belt at all times while in a car, on a bus, or on a plane.  Take frequent breaks and rest with your legs elevated if you have leg cramps or low back pain.  Do not use hot tubs, steam rooms, or saunas.  You may continue to have sex unless your health care provider tells you otherwise. Lifestyle  Do not use any products that contain nicotine or tobacco, such as cigarettes and e-cigarettes. If you need help quitting, ask your health care provider.  Do not drink alcohol.  Do not use any medicinal herbs or unprescribed drugs. These chemicals affect the formation and growth of the baby.  If you develop varicose veins:  Wear support pantyhose or compression stockings as told by your healthcare provider.  Elevate your feet for 15 minutes, 3-4 times a day.  Wear a supportive maternity bra to help with breast tenderness. General instructions  Take over-the-counter and prescription  medicines only as told by your health care provider. There are medicines that are either safe or unsafe to take during pregnancy.  Take warm sitz baths to soothe any pain or discomfort caused by hemorrhoids. Use hemorrhoid cream or witch hazel if your health care provider approves.  Avoid cat litter boxes and soil used by cats. These carry germs that can cause birth defects in the baby. If you have a cat, ask someone to clean the litter box for you.  To prepare for the arrival of your baby:  Take prenatal classes to understand, practice, and ask questions about the labor and delivery.  Make a trial run to the hospital.  Visit the hospital and tour the maternity area.  Arrange for maternity or paternity leave through employers.  Arrange for family and friends to take care of pets while you are in the hospital.  Purchase a rear-facing car seat and make sure you know how to install it in your car.  Pack your hospital bag.  Prepare the baby's nursery. Make sure to remove all pillows and stuffed animals from the baby's crib to prevent suffocation.  Visit your dentist if you have not gone during your pregnancy. Use a soft toothbrush to brush your teeth and be gentle when you floss.  Keep all prenatal follow-up visits as told by your health care provider. This is important. Contact a health care provider if:  You are unsure if you are in labor or if your water has broken.  You become dizzy.  You have mild pelvic cramps, pelvic pressure, or nagging pain in your abdominal area.  You have lower back pain.  You have persistent nausea, vomiting, or diarrhea.  You have an unusual or bad smelling vaginal discharge.  You have pain when you urinate. Get help right away if:  You have a fever.  You are leaking fluid from your vagina.  You have spotting or bleeding from your vagina.  You have severe abdominal pain or cramping.  You have rapid weight loss or weight gain.  You have  shortness of breath with chest pain.  You notice sudden or extreme swelling of your face, hands, ankles, feet, or legs.  Your baby makes fewer than 10 movements in 2 hours.  You have severe headaches that do not go away with medicine.  You have vision changes. Summary  The third trimester is from week 29 through week 40, months 7 through 9. The third trimester is a time when the unborn baby (fetus)   is growing rapidly.  During the third trimester, your discomfort may increase as you and your baby continue to gain weight. You may have abdominal, leg, and back pain, sleeping problems, and an increased need to urinate.  During the third trimester your breasts will keep growing and they will continue to become tender. A yellow fluid (colostrum) may leak from your breasts. This is the first milk you are producing for your baby.  False labor is a condition in which you feel small, irregular tightenings of the muscles in the womb (contractions) that eventually go away. These are called Braxton Hicks contractions. Contractions may last for hours, days, or even weeks before true labor sets in.  Signs of labor can include: abdominal cramps; regular contractions that start at 10 minutes apart and become stronger and more frequent with time; watery or bloody mucus discharge that comes from the vagina; increased pelvic pressure and dull back pain; and leaking of amniotic fluid. This information is not intended to replace advice given to you by your health care provider. Make sure you discuss any questions you have with your health care provider. Document Released: 07/14/2001 Document Revised: 12/26/2015 Document Reviewed: 09/20/2012 Elsevier Interactive Patient Education  2017 [employer]  Places to have your son circumcised:    [facility] [phone] $480 by 4 wks  [facility] [phone] $244 by 4  wks  [facility] [phone] $175 by 2 wks  [facility] [phone] $250 by 7 days [facility] [phone] $150 by 4 wks  These prices sometimes change but are roughly what you can expect to pay. Please call and confirm pricing.   Circumcision is considered an elective/non-medically necessary procedure. There are many reasons parents decide to have their sons circumsized. During the first year of life circumcised males have a reduced risk of urinary tract infections but after this year the rates between circumcised males and uncircumcised males are the same.  It is safe to have your son circumcised outside of the hospital and the places above perform them regularly.

## 2016-09-28 NOTE — Addendum Note (Signed)
Addended by: Gerome ApleyZEYFANG, Alantra Popoca L on: 09/28/2016 04:03 PM   Modules accepted: Orders

## 2016-09-28 NOTE — Progress Notes (Signed)
Would like to get flu shot and rhogam today, tdap next visit.

## 2016-09-28 NOTE — Progress Notes (Signed)
   PRENATAL VISIT NOTE  Subjective:  Brandy [name] is a 21 y.o. [id number] at 6094w6d being seen today for ongoing prenatal care.  She is currently monitored for the following issues for this low-risk pregnancy and has HSV-2 (herpes simplex virus 2) infection; Rh negative state in antepartum period; and Supervision of normal pregnancy on her problem list.  Patient reports fatigue.  Contractions: Irregular. Vag. Bleeding: None.  Movement: Present. Denies leaking of fluid.   The following portions of the patient's history were reviewed and updated as appropriate: allergies, current medications, past family history, past medical history, past social history, past surgical history and problem list. Problem list updated.  Objective:   Vitals:   09/28/16 1524  BP: 104/66  Pulse: 77  Weight: 148 lb 4.8 oz (67.3 kg)    Fetal Status: Fetal Heart Rate (bpm): 130   Movement: Present     General:  Alert, oriented and cooperative. Patient is in no acute distress.  Skin: Skin is warm and dry. No rash noted.   Cardiovascular: Normal heart rate noted  Respiratory: Normal respiratory effort, no problems with respiration noted  Abdomen: Soft, gravid, appropriate for gestational age. Pain/Pressure: Present     Pelvic:  Cervical exam deferred        Extremities: Normal range of motion.  Edema: Trace  Mental Status: Normal mood and affect. Normal behavior. Normal judgment and thought content.   Assessment and Plan:  Pregnancy: G3P1102 at 2694w6d  1. Supervision of other normal pregnancy, antepartum  has missed several appointments -rhogam - Flu Vaccine QUAD 36+ mos IM (Fluarix, Quad PF) -28 wks labs and tdap at next vist to be scheduled ASAP  2. Need for rhogam due to Rh negative mother Rhogam today  Term labor symptoms and general obstetric precautions including but not limited to vaginal bleeding, contractions, leaking of fluid and fetal movement were reviewed in detail with the patient. Please  refer to After Visit Summary for other counseling recommendations.  Return in about 4 weeks (around 10/26/2016) for LOB.   Lorne SkeensNicholas Michael Jerl Munyan, MD

## 2016-09-30 ENCOUNTER — Other Ambulatory Visit: Payer: Medicaid Other

## 2016-10-15 ENCOUNTER — Other Ambulatory Visit: Payer: Medicaid Other

## 2016-10-16 ENCOUNTER — Inpatient Hospital Stay (HOSPITAL_COMMUNITY)
Admission: AD | Admit: 2016-10-16 | Discharge: 2016-10-16 | Disposition: A | Discharge status: Home / Self Care | Payer: Medicaid Other | Source: Ambulatory Visit | Attending: Obstetrics and Gynecology | Admitting: Obstetrics and Gynecology

## 2016-10-16 DIAGNOSIS — O26893 Other specified pregnancy related conditions, third trimester: Secondary | ICD-10-CM

## 2016-10-16 DIAGNOSIS — O4703 False labor before 37 completed weeks of gestation, third trimester: Secondary | ICD-10-CM | POA: Diagnosis not present

## 2016-10-16 DIAGNOSIS — Z3A31 31 weeks gestation of pregnancy: Secondary | ICD-10-CM | POA: Diagnosis not present

## 2016-10-16 DIAGNOSIS — R102 Pelvic and perineal pain: Secondary | ICD-10-CM | POA: Diagnosis not present

## 2016-10-16 DIAGNOSIS — Z79899 Other long term (current) drug therapy: Secondary | ICD-10-CM | POA: Insufficient documentation

## 2016-10-16 DIAGNOSIS — O479 False labor, unspecified: Secondary | ICD-10-CM

## 2016-10-16 DIAGNOSIS — Z3480 Encounter for supervision of other normal pregnancy, unspecified trimester: Secondary | ICD-10-CM

## 2016-10-16 DIAGNOSIS — O26899 Other specified pregnancy related conditions, unspecified trimester: Secondary | ICD-10-CM

## 2016-10-16 LAB — WET PREP, GENITAL
CLUE CELLS WET PREP: NONE SEEN
Sperm: NONE SEEN
Trich, Wet Prep: NONE SEEN
YEAST WET PREP: NONE SEEN

## 2016-10-16 LAB — URINALYSIS, ROUTINE W REFLEX MICROSCOPIC
Bilirubin Urine: NEGATIVE
Glucose, UA: NEGATIVE mg/dL
Hgb urine dipstick: NEGATIVE
KETONES UR: NEGATIVE mg/dL
Nitrite: NEGATIVE
PROTEIN: NEGATIVE mg/dL
Specific Gravity, Urine: 1.017 (ref 1.005–1.030)
pH: 6 (ref 5.0–8.0)

## 2016-10-16 NOTE — Discharge Instructions (Signed)
Round Ligament Pain during Pregnancy Many women will experience a type of pain referred to as round ligament pain during their pregnancy. This is associated with abdominal pain or discomfort. Since any type of abdominal pain during pregnancy can be disconcerting, it is important to talk about round ligament pain to relieve any anxiety or fears you may have regarding the symptoms you are feeling. Round ligament pain is due to normal changes that take place in the body during pregnancy. It is caused by stretching of the round ligaments attached to the uterus. More commonly it occurs on the right side of the pelvis. Round Ligament: An Overview Typically in the non-pregnant state the uterus is about the size of an apple or pear. There are thick ligaments which hold the uterus in place in the abdomen, referred to as round ligaments. During pregnancy, your uterus will expand in size and weight, and the ligaments supporting it will have to stretch, becoming longer and thinner. As these ligaments pull and tug they may irritate nearby nerve fibers, which causes pain. The severity of the pain in some cases can seem extreme. Some common symptoms of round ligament pain include:  Ligament spasms or contractions/cramps that trigger a sharp pain typically on the right side of the abdomen.  Pain upon waking or suddenly rolling over in your sleep.  Pain in the abdomen that is sharp brought on by exercise or other vigorous activity. Similar Problems Round ligament pain is often mistaken for other medical conditions because the symptoms are similar. Acute abdominal pain during pregnancy may also be a sign of other conditions including:  Abdominal cramps - Some abdominal pain is simply caused by change in bowel habits associated with pregnancy. Gas is a common problem that can cause sharp, shooting pain. You should always seek out medical care if your pain is accompanied by fever, chills, pain  upon urination or if you have difficulty walking. Further exams and tests will be conducted to ensure that you do not have a more serious condition. It is not uncommon for women with lower abdominal pain to have a urinary tract infection, thus you may also be asked for a urine sample. Treatment If all other conditions are ruled out you can treat your round ligament pain relatively easily. You may be advised to take some acetaminophen (Tylenol) to reduce the severity of any persistent pain and asked to reduce your activity level. You can apply a heating pad to the area of pain or take a warm bath. Lying on the opposite side of the pain may help as well. Most women will find relief from round ligament pain simply by altering their daily routines slightly. The good news is round ligament pain will disappear completely once you have given birth to your child!  Preterm Labor and Birth Information Pregnancy normally lasts 39-41 weeks. Preterm labor is when labor starts early. It starts before you have been pregnant for 37 whole weeks. What are the risk factors for preterm labor? Preterm labor is more likely to occur in women who:  Have an infection while pregnant.  Have a cervix that is short.  Have gone into preterm labor before.  Have had surgery on their cervix.  Are younger than age 23.  Are older than age 23.  Are African American.  Are pregnant with two or more babies.  Take street drugs while pregnant.  Smoke while pregnant.  Do not gain enough weight while pregnant.  Got pregnant right after another pregnancy.  What are the symptoms of preterm labor? Symptoms of preterm labor include:  Cramps. The cramps may feel like the cramps some women get during their period. The cramps may happen with watery poop (diarrhea).  Pain in the belly (abdomen).  Pain in the lower back.  Regular contractions or tightening. It may feel like your belly is getting tighter.  Pressure in  the lower belly that seems to get stronger.  More fluid (discharge) leaking from the vagina. The fluid may be watery or bloody.  Water breaking. Why is it important to notice signs of preterm labor? Babies who are born early may not be fully developed. They have a higher chance for:  Long-term heart problems.  Long-term lung problems.  Trouble controlling body systems, like breathing.  Bleeding in the brain.  A condition called cerebral palsy.  Learning difficulties.  Death. These risks are highest for babies who are born before 34 weeks of pregnancy. How is preterm labor treated? Treatment depends on:  How long you were pregnant.  Your condition.  The health of your baby. Treatment may involve:  Having a stitch (suture) placed in your cervix. When you give birth, your cervix opens so the baby can come out. The stitch keeps the cervix from opening too soon.  Staying at the hospital.  Taking or getting medicines, such as:  Hormone medicines.  Medicines to stop contractions.  Medicines to help the babys lungs develop.  Medicines to prevent your baby from having cerebral palsy. What should I do if I am in preterm labor? If you think you are going into labor too soon, call your doctor right away. How can I prevent preterm labor?  Do not use any tobacco products.  Examples of these are cigarettes, chewing tobacco, and e-cigarettes.  If you need help quitting, ask your doctor.  Do not use street drugs.  Do not use any medicines unless you ask your doctor if they are safe for you.  Talk with your doctor before taking any herbal supplements.  Make sure you gain enough weight.  Watch for infection. If you think you might have an infection, get it checked right away.  If you have gone into preterm labor before, tell your doctor. This information is not intended to replace advice given to you by your health care provider. Make sure you discuss any questions you  have with your health care provider. Document Released: 10/16/2008 Document Revised: 12/31/2015 Document Reviewed: 12/11/2015 Elsevier Interactive Patient Education  2017 [employer].

## 2016-10-16 NOTE — MAU Provider Note (Signed)
History     CSN: [id number]  Arrival date and time: 10/16/16 0205   First Provider Initiated Contact with Patient 10/16/16 0303      Chief Complaint  Patient presents with  . Pelvic Pain   Brandy [name] is a [age] y.W.[id number].G3P1102 at 5483w3d who presents today with contractions and clear discharge x 2 days. She rates her pain 8/10 at this time. She reports normal fetal movement.    Pelvic Pain  The patient's primary symptoms include pelvic pain. This is a new problem. Episode onset: 2 days ago. The problem occurs intermittently. The problem has been unchanged. Pain severity now: 8/10. The vaginal discharge was clear. There has been no bleeding. Nothing aggravates the symptoms. She has tried nothing for the symptoms. Sexual activity: patient denies intercourse or anything in vagina in last 24 hours.     Past Medical History:  Diagnosis Date  . Chlamydia   . Heart murmur     Past Surgical History:  Procedure Laterality Date  . NO PAST SURGERIES      Family History  Problem Relation Age of Onset  . Asthma Mother     Social History  Substance Use Topics  . Smoking status: Never Smoker  . Smokeless tobacco: Never Used  . Alcohol use No    Allergies:  Allergies  Allergen Reactions  . Kiwi Extract Swelling    Causes swelling of the mouth and throat.  Marland Kitchen. Penicillins Hives    Has patient had a PCN reaction causing immediate rash, facial/tongue/throat swelling, SOB or lightheadedness with hypotension: Yes Has patient had a PCN reaction causing severe rash involving mucus membranes or skin necrosis: No Has patient had a PCN reaction that required hospitalization Yes Has patient had a PCN reaction occurring within the last 10 years: No If all of the above answers are "NO", then may proceed with Cephalosporin use.   Josefina Do. Lobster [Shellfish Allergy] Rash    Prescriptions Prior to Admission  Medication Sig Dispense Refill Last Dose  . acetaminophen (TYLENOL) 325 MG tablet Take 650  mg by mouth every 6 (six) hours as needed for mild pain or headache.   Taking  . ondansetron (ZOFRAN) 4 MG tablet Take 1 tablet (4 mg total) by mouth every 8 (eight) hours as needed for nausea or vomiting. 20 tablet 0 Taking  . Prenatal Vit-Fe Fumarate-FA (PRENATAL MULTIVITAMIN) TABS tablet Take 1 tablet by mouth daily at 12 noon.   Taking    Review of Systems  Genitourinary: Positive for pelvic pain.   Physical Exam   unknown if currently breastfeeding.  Physical Exam  Nursing note and vitals reviewed. Constitutional: She is oriented to person, place, and time. She appears well-developed and well-nourished. No distress.  HENT:  Head: Normocephalic.  Cardiovascular: Normal rate.   Respiratory: Effort normal.  GI: Soft. There is no tenderness. There is no rebound.  Genitourinary:  Genitourinary Comments:  External: no lesion Vagina: small amount of white discharge Cervix: pink, smooth, no CMT 1@ext  os/closed@int  os/thick/ballotable  Uterus: AGA   Neurological: She is alert and oriented to person, place, and time.  Skin: Skin is warm and dry.  Psychiatric: She has a normal mood and affect.   Patient is playing on her phone during the history and exam despsite rating her pain 8/10.   FHT: 130, moderate with 15x15 accels, no decels  Toco: a couple of contractions when first on monitor, and none now. Patient states that she really hasn't felt any contractions today. She was  having contractions yesterday, but they are gone now. She states that she just wanted to get her cervix checked because of the contractions yesterday.   Results for orders placed or performed during the hospital encounter of 10/16/16 (from the past 24 hour(s))  Urinalysis, Routine w reflex microscopic     Status: Abnormal   Collection Time: 10/16/16  2:35 AM  Result Value Ref Range   Color, Urine YELLOW YELLOW   APPearance CLEAR CLEAR   Specific Gravity, Urine 1.017 1.005 - 1.030   pH 6.0 5.0 - 8.0    Glucose, UA NEGATIVE NEGATIVE mg/dL   Hgb urine dipstick NEGATIVE NEGATIVE   Bilirubin Urine NEGATIVE NEGATIVE   Ketones, ur NEGATIVE NEGATIVE mg/dL   Protein, ur NEGATIVE NEGATIVE mg/dL   Nitrite NEGATIVE NEGATIVE   Leukocytes, UA TRACE (A) NEGATIVE   RBC / HPF 0-5 0 - 5 RBC/hpf   WBC, UA 0-5 0 - 5 WBC/hpf   Bacteria, UA RARE (A) NONE SEEN   Squamous Epithelial / LPF 0-5 (A) NONE SEEN   Mucous PRESENT   Wet prep, genital     Status: Abnormal   Collection Time: 10/16/16  3:00 AM  Result Value Ref Range   Yeast Wet Prep HPF POC NONE SEEN NONE SEEN   Trich, Wet Prep NONE SEEN NONE SEEN   Clue Cells Wet Prep HPF POC NONE SEEN NONE SEEN   WBC, Wet Prep HPF POC MODERATE (A) NONE SEEN   Sperm NONE SEEN     MAU Course  Procedures  MDM   Assessment and Plan   1. Pelvic pain in pregnancy, antepartum, third trimester   2. Supervision of other normal pregnancy, antepartum   3. Pain of round ligament affecting pregnancy, antepartum   4. Braxton Hicks contractions   5. [redacted] weeks gestation of pregnancy    DC home Comfort measures reviewed  3rd Trimester precautions  PTL precautions  Fetal kick counts RX: none  Return to MAU as needed FU with OB as planned  Follow-up Information    [facility] Follow up.   Specialty:  Obstetrics and Gynecology Contact information: [address] [place] [address] [phone]           Tawnya Crook 10/16/2016, 3:06 AM

## 2016-10-17 LAB — GC/CHLAMYDIA PROBE AMP (~~LOC~~) NOT AT ARMC
Chlamydia: NEGATIVE
Neisseria Gonorrhea: NEGATIVE

## 2016-10-26 ENCOUNTER — Encounter: Payer: Medicaid Other | Admitting: Family Medicine

## 2016-11-04 ENCOUNTER — Ambulatory Visit (INDEPENDENT_AMBULATORY_CARE_PROVIDER_SITE_OTHER): Payer: Medicaid Other | Admitting: Obstetrics and Gynecology

## 2016-11-04 VITALS — BP 122/74 | HR 84 | Wt 149.8 lb

## 2016-11-04 DIAGNOSIS — Z3490 Encounter for supervision of normal pregnancy, unspecified, unspecified trimester: Secondary | ICD-10-CM

## 2016-11-04 DIAGNOSIS — Z3483 Encounter for supervision of other normal pregnancy, third trimester: Secondary | ICD-10-CM

## 2016-11-04 DIAGNOSIS — Z23 Encounter for immunization: Secondary | ICD-10-CM | POA: Diagnosis not present

## 2016-11-04 DIAGNOSIS — Z3480 Encounter for supervision of other normal pregnancy, unspecified trimester: Secondary | ICD-10-CM

## 2016-11-04 MED ORDER — TETANUS-DIPHTH-ACELL PERTUSSIS 5-2.5-18.5 LF-MCG/0.5 IM SUSP
0.5000 mL | Freq: Once | INTRAMUSCULAR | Status: AC
  Administered 2016-11-04: 0.5 mL via INTRAMUSCULAR

## 2016-11-04 MED ORDER — VALACYCLOVIR HCL 500 MG PO TABS: 500.0000 mg | ORAL_TABLET | Freq: Every day | ORAL | 1 refills | Status: DC

## 2016-11-04 NOTE — Patient Instructions (Signed)
Contraception Choices Contraception (birth control) is the use of any methods or devices to prevent pregnancy. Below are some methods to help avoid pregnancy. Hormonal methods  Contraceptive implant. This is a thin, plastic tube containing progesterone hormone. It does not contain estrogen hormone. Your health care provider inserts the tube in the inner part of the upper arm. The tube can remain in place for up to 3 years. After 3 years, the implant must be removed. The implant prevents the ovaries from releasing an egg (ovulation), thickens the cervical mucus to prevent sperm from entering the uterus, and thins the lining of the inside of the uterus.  Progesterone-only injections. These injections are given every 3 months by your health care provider to prevent pregnancy. This synthetic progesterone hormone stops the ovaries from releasing eggs. It also thickens cervical mucus and changes the uterine lining. This makes it harder for sperm to survive in the uterus.  Birth control pills. These pills contain estrogen and progesterone hormone. They work by preventing the ovaries from releasing eggs (ovulation). They also cause the cervical mucus to thicken, preventing the sperm from entering the uterus. Birth control pills are prescribed by a health care provider.Birth control pills can also be used to treat heavy periods.  Minipill. This type of birth control pill contains only the progesterone hormone. They are taken every day of each month and must be prescribed by your health care provider.  Birth control patch. The patch contains hormones similar to those in birth control pills. It must be changed once a week and is prescribed by a health care provider.  Vaginal ring. The ring contains hormones similar to those in birth control pills. It is left in the vagina for 3 weeks, removed for 1 week, and then a new one is put back in place. The patient must be comfortable inserting and removing the ring from  the vagina.A health care provider's prescription is necessary.  Emergency contraception. Emergency contraceptives prevent pregnancy after unprotected sexual intercourse. This pill can be taken right after sex or up to 5 days after unprotected sex. It is most effective the sooner you take the pills after having sexual intercourse. Most emergency contraceptive pills are available without a prescription. Check with your pharmacist. Do not use emergency contraception as your only form of birth control. Barrier methods  Female condom. This is a thin sheath (latex or rubber) that is worn over the penis during sexual intercourse. It can be used with spermicide to increase effectiveness.  Female condom. This is a soft, loose-fitting sheath that is put into the vagina before sexual intercourse.  Diaphragm. This is a soft, latex, dome-shaped barrier that must be fitted by a health care provider. It is inserted into the vagina, along with a spermicidal jelly. It is inserted before intercourse. The diaphragm should be left in the vagina for 6 to 8 hours after intercourse.  Cervical cap. This is a round, soft, latex or plastic cup that fits over the cervix and must be fitted by a health care provider. The cap can be left in place for up to 48 hours after intercourse.  Sponge. This is a soft, circular piece of polyurethane foam. The sponge has spermicide in it. It is inserted into the vagina after wetting it and before sexual intercourse.  Spermicides. These are chemicals that kill or block sperm from entering the cervix and uterus. They come in the form of creams, jellies, suppositories, foam, or tablets. They do not require a prescription. They   are inserted into the vagina with an applicator before having sexual intercourse. The process must be repeated every time you have sexual intercourse. Intrauterine contraception  Intrauterine device (IUD). This is a T-shaped device that is put in a woman's uterus during  a menstrual period to prevent pregnancy. There are 2 types: ? Copper IUD. This type of IUD is wrapped in copper wire and is placed inside the uterus. Copper makes the uterus and fallopian tubes produce a fluid that kills sperm. It can stay in place for 10 years. ? Hormone IUD. This type of IUD contains the hormone progestin (synthetic progesterone). The hormone thickens the cervical mucus and prevents sperm from entering the uterus, and it also thins the uterine lining to prevent implantation of a fertilized egg. The hormone can weaken or kill the sperm that get into the uterus. It can stay in place for 3-5 years, depending on which type of IUD is used. Permanent methods of contraception  Female tubal ligation. This is when the woman's fallopian tubes are surgically sealed, tied, or blocked to prevent the egg from traveling to the uterus.  Hysteroscopic sterilization. This involves placing a small coil or insert into each fallopian tube. Your doctor uses a technique called hysteroscopy to do the procedure. The device causes scar tissue to form. This results in permanent blockage of the fallopian tubes, so the sperm cannot fertilize the egg. It takes about 3 months after the procedure for the tubes to become blocked. You must use another form of birth control for these 3 months.  Female sterilization. This is when the female has the tubes that carry sperm tied off (vasectomy).This blocks sperm from entering the vagina during sexual intercourse. After the procedure, the man can still ejaculate fluid (semen). Natural planning methods  Natural family planning. This is not having sexual intercourse or using a barrier method (condom, diaphragm, cervical cap) on days the woman could become pregnant.  Calendar method. This is keeping track of the length of each menstrual cycle and identifying when you are fertile.  Ovulation method. This is avoiding sexual intercourse during ovulation.  Symptothermal method.  This is avoiding sexual intercourse during ovulation, using a thermometer and ovulation symptoms.  Post-ovulation method. This is timing sexual intercourse after you have ovulated. Regardless of which type or method of contraception you choose, it is important that you use condoms to protect against the transmission of sexually transmitted infections (STIs). Talk with your health care provider about which form of contraception is most appropriate for you. This information is not intended to replace advice given to you by your health care provider. Make sure you discuss any questions you have with your health care provider. Document Released: 07/20/2005 Document Revised: 12/26/2015 Document Reviewed: 01/12/2013 Elsevier Interactive Patient Education  2017 Elsevier Inc.  

## 2016-11-04 NOTE — Progress Notes (Addendum)
   PRENATAL VISIT NOTE  Subjective:  Brandy [name] is a 21 y.o. Q6V7846 at [redacted]w[redacted]d being seen today for ongoing prenatal care.  She is currently monitored for the following issues for this low-risk pregnancy and has HSV-2 (herpes simplex virus 2) infection; Rh negative state in antepartum period; and Supervision of normal pregnancy on her problem list.  Patient reports no complaints.  Contractions: Irritability. Vag. Bleeding: None.  Movement: Present. Denies leaking of fluid.   The following portions of the patient's history were reviewed and updated as appropriate: allergies, current medications, past family history, past medical history, past social history, past surgical history and problem list. Problem list updated.  Objective:   Vitals:   11/04/16 1317  BP: 122/74  Pulse: 84  Weight: 149 lb 12.8 oz (67.9 kg)    Fetal Status: Fetal Heart Rate (bpm): 134   Movement: Present     General:  Alert, oriented and cooperative. Patient is in no acute distress.  Skin: Skin is warm and dry. No rash noted.   Cardiovascular: Normal heart rate noted  Respiratory: Normal respiratory effort, no problems with respiration noted  Abdomen: Soft, gravid, appropriate for gestational age. Pain/Pressure: Present     Pelvic:  Cervical exam deferred        Extremities: Normal range of motion.  Edema: None  Mental Status: Normal mood and affect. Normal behavior. Normal judgment and thought content.   Assessment and Plan:  Pregnancy: G3P1102 at [redacted]w[redacted]d  1. Encounter for supervision of low-risk pregnancy, antepartum   Contraceptive options discussed and encouraged to try breastfeeding - Glucose Tolerance, 1 Hour - RPR - HIV antibody (with reflex) - CBC  2. History of HSV-2   Start Valtrex  po qd    Preterm labor symptoms and general obstetric precautions including but not limited to vaginal bleeding, contractions, leaking of fluid and fetal movement were reviewed in detail with the  patient. Please refer to After Visit Summary for other counseling recommendations.  Return in about 2 weeks (around 11/18/2016).   Sicilia Killough Colin Mulders, CNM   ABS pos due to RhoGam

## 2016-11-05 LAB — GLUCOSE TOLERANCE, 1 HOUR: Glucose, 1Hr PP: 91 mg/dL (ref 65–199)

## 2016-11-05 LAB — CBC
HEMOGLOBIN: 10.3 g/dL — AB (ref 11.1–15.9)
Hematocrit: 31.3 % — ABNORMAL LOW (ref 34.0–46.6)
MCH: 30.5 pg (ref 26.6–33.0)
MCHC: 32.9 g/dL (ref 31.5–35.7)
MCV: 93 fL (ref 79–97)
Platelets: 217 10*3/uL (ref 150–379)
RBC: 3.38 x10E6/uL — AB (ref 3.77–5.28)
RDW: 13.4 % (ref 12.3–15.4)
WBC: 5.2 10*3/uL (ref 3.4–10.8)

## 2016-11-05 LAB — HIV ANTIBODY (ROUTINE TESTING W REFLEX): HIV SCREEN 4TH GENERATION: NONREACTIVE

## 2016-11-05 LAB — RPR: RPR: NONREACTIVE

## 2016-11-18 ENCOUNTER — Encounter: Payer: Medicaid Other | Admitting: Family Medicine

## 2016-11-19 ENCOUNTER — Ambulatory Visit (INDEPENDENT_AMBULATORY_CARE_PROVIDER_SITE_OTHER): Payer: Medicaid Other | Admitting: Student

## 2016-11-19 ENCOUNTER — Other Ambulatory Visit (HOSPITAL_COMMUNITY)
Admission: RE | Admit: 2016-11-19 | Discharge: 2016-11-19 | Disposition: A | Discharge status: Home / Self Care | Payer: Medicaid Other | Source: Ambulatory Visit | Attending: Student | Admitting: Student

## 2016-11-19 VITALS — BP 114/67 | HR 89 | Wt 151.4 lb

## 2016-11-19 DIAGNOSIS — B009 Herpesviral infection, unspecified: Secondary | ICD-10-CM

## 2016-11-19 DIAGNOSIS — O26899 Other specified pregnancy related conditions, unspecified trimester: Secondary | ICD-10-CM

## 2016-11-19 DIAGNOSIS — O98313 Other infections with a predominantly sexual mode of transmission complicating pregnancy, third trimester: Secondary | ICD-10-CM

## 2016-11-19 DIAGNOSIS — Z3483 Encounter for supervision of other normal pregnancy, third trimester: Secondary | ICD-10-CM | POA: Diagnosis present

## 2016-11-19 DIAGNOSIS — Z6791 Unspecified blood type, Rh negative: Secondary | ICD-10-CM | POA: Diagnosis not present

## 2016-11-19 DIAGNOSIS — A6009 Herpesviral infection of other urogenital tract: Secondary | ICD-10-CM | POA: Diagnosis not present

## 2016-11-19 DIAGNOSIS — Z113 Encounter for screening for infections with a predominantly sexual mode of transmission: Secondary | ICD-10-CM

## 2016-11-19 NOTE — Progress Notes (Signed)
   PRENATAL VISIT NOTE  Subjective:  Brandy [name] is a 21 y.o. [id number] at [redacted]w[redacted]d being seen today for ongoing prenatal care.  She is currently monitored for the following issues for this low-risk pregnancy and has HSV-2 (herpes simplex virus 2) infection; Rh negative state in antepartum period; and Supervision of normal pregnancy on her problem list.  Patient reports no complaints.  Contractions: Irritability. Vag. Bleeding: None.  Movement: Present. Denies leaking of fluid.  Reports no lesions, tingling or recent outbreaks.   The following portions of the patient's history were reviewed and updated as appropriate: allergies, current medications, past family history, past medical history, past social history, past surgical history and problem list. Problem list updated.  Objective:   Vitals:   11/19/16 1454  BP: 114/67  Pulse: 89  Weight: 151 lb 6.4 oz (68.7 kg)    Fetal Status: Fetal Heart Rate (bpm): 131 Fundal Height: 37 cm Movement: Present  Presentation: Vertex  General:  Alert, oriented and cooperative. Patient is in no acute distress.  Skin: Skin is warm and dry. No rash noted.   Cardiovascular: Normal heart rate noted  Respiratory: Normal respiratory effort, no problems with respiration noted  Abdomen: Soft, gravid, appropriate for gestational age. Pain/Pressure: Present     Pelvic:  Cervical exam performed Dilation: Fingertip Effacement (%): 10 Station: Ballotable  Extremities: Normal range of motion.  Edema: Trace  Mental Status: Normal mood and affect. Normal behavior. Normal judgment and thought content.   Assessment and Plan:  Pregnancy: G3P1102 at [redacted]w[redacted]d  1. Encounter for supervision of other normal pregnancy in third trimester Feeling well, no complaints.  - GC/Chlamydia probe amp ([place])not at [facility] - Strep Gp B Culture+Rflx  2. Rh negative state in antepartum period -Received her Rhogam at 28 weeks   3. HSV-2 (herpes simplex virus 2)  infection -Currently taking valtrex; will continue until delivery  Term labor symptoms and general obstetric precautions including but not limited to vaginal bleeding, contractions, leaking of fluid and fetal movement were reviewed in detail with the patient. Please refer to After Visit Summary for other counseling recommendations.  Return in about 1 week (around 11/26/2016).   Marylene Land, CNM

## 2016-11-19 NOTE — Patient Instructions (Signed)

## 2016-11-20 LAB — GC/CHLAMYDIA PROBE AMP (~~LOC~~) NOT AT ARMC
Chlamydia: NEGATIVE
Neisseria Gonorrhea: NEGATIVE

## 2016-11-22 ENCOUNTER — Encounter (HOSPITAL_COMMUNITY): Payer: Self-pay | Admitting: *Deleted

## 2016-11-22 ENCOUNTER — Inpatient Hospital Stay (HOSPITAL_COMMUNITY): Payer: Medicaid Other | Admitting: Anesthesiology

## 2016-11-22 ENCOUNTER — Inpatient Hospital Stay (HOSPITAL_COMMUNITY)
Admission: AD | Admit: 2016-11-22 | Discharge: 2016-11-24 | DRG: 774 | Disposition: A | Discharge status: Home / Self Care | Payer: Medicaid Other | Source: Ambulatory Visit | Attending: Obstetrics & Gynecology | Admitting: Obstetrics & Gynecology

## 2016-11-22 DIAGNOSIS — O9832 Other infections with a predominantly sexual mode of transmission complicating childbirth: Secondary | ICD-10-CM | POA: Diagnosis present

## 2016-11-22 DIAGNOSIS — Z880 Allergy status to penicillin: Secondary | ICD-10-CM | POA: Diagnosis not present

## 2016-11-22 DIAGNOSIS — Z6791 Unspecified blood type, Rh negative: Secondary | ICD-10-CM

## 2016-11-22 DIAGNOSIS — Z3A36 36 weeks gestation of pregnancy: Secondary | ICD-10-CM

## 2016-11-22 DIAGNOSIS — O26893 Other specified pregnancy related conditions, third trimester: Secondary | ICD-10-CM | POA: Diagnosis present

## 2016-11-22 DIAGNOSIS — O42013 Preterm premature rupture of membranes, onset of labor within 24 hours of rupture, third trimester: Secondary | ICD-10-CM | POA: Diagnosis not present

## 2016-11-22 DIAGNOSIS — O42913 Preterm premature rupture of membranes, unspecified as to length of time between rupture and onset of labor, third trimester: Secondary | ICD-10-CM | POA: Diagnosis present

## 2016-11-22 DIAGNOSIS — Z3483 Encounter for supervision of other normal pregnancy, third trimester: Secondary | ICD-10-CM

## 2016-11-22 DIAGNOSIS — A6000 Herpesviral infection of urogenital system, unspecified: Secondary | ICD-10-CM | POA: Diagnosis present

## 2016-11-22 LAB — CBC
HCT: 31.7 % — ABNORMAL LOW (ref 36.0–46.0)
Hemoglobin: 10.3 g/dL — ABNORMAL LOW (ref 12.0–15.0)
MCH: 31 pg (ref 26.0–34.0)
MCHC: 32.5 g/dL (ref 30.0–36.0)
MCV: 95.5 fL (ref 78.0–100.0)
PLATELETS: 209 10*3/uL (ref 150–400)
RBC: 3.32 MIL/uL — ABNORMAL LOW (ref 3.87–5.11)
RDW: 13.8 % (ref 11.5–15.5)
WBC: 6.7 10*3/uL (ref 4.0–10.5)

## 2016-11-22 LAB — RPR: RPR Ser Ql: NONREACTIVE

## 2016-11-22 MED ORDER — DIBUCAINE 1 % RE OINT
1.0000 "application " | TOPICAL_OINTMENT | RECTAL | Status: DC | PRN
  Administered 2016-11-22: 1 via RECTAL
  Filled 2016-11-22: qty 28

## 2016-11-22 MED ORDER — PHENYLEPHRINE 40 MCG/ML (10ML) SYRINGE FOR IV PUSH (FOR BLOOD PRESSURE SUPPORT)
80.0000 ug | PREFILLED_SYRINGE | INTRAVENOUS | Status: DC | PRN
  Filled 2016-11-22: qty 5

## 2016-11-22 MED ORDER — MEASLES, MUMPS & RUBELLA VAC ~~LOC~~ INJ
0.5000 mL | INJECTION | Freq: Once | SUBCUTANEOUS | Status: DC
  Filled 2016-11-22: qty 0.5

## 2016-11-22 MED ORDER — ACETAMINOPHEN 325 MG PO TABS
650.0000 mg | ORAL_TABLET | ORAL | Status: DC | PRN
  Administered 2016-11-22 – 2016-11-23 (×3): 650 mg via ORAL
  Filled 2016-11-22 (×3): qty 2

## 2016-11-22 MED ORDER — WITCH HAZEL-GLYCERIN EX PADS
1.0000 "application " | MEDICATED_PAD | CUTANEOUS | Status: DC | PRN
  Administered 2016-11-22: 1 via TOPICAL

## 2016-11-22 MED ORDER — METHYLERGONOVINE MALEATE 0.2 MG PO TABS: 0.2000 mg | ORAL_TABLET | ORAL | Status: DC | PRN

## 2016-11-22 MED ORDER — SENNOSIDES-DOCUSATE SODIUM 8.6-50 MG PO TABS
2.0000 | ORAL_TABLET | ORAL | Status: DC
  Administered 2016-11-22 – 2016-11-24 (×2): 2 via ORAL
  Filled 2016-11-22 (×2): qty 2

## 2016-11-22 MED ORDER — LIDOCAINE HCL (PF) 1 % IJ SOLN
30.0000 mL | INTRAMUSCULAR | Status: DC | PRN
  Filled 2016-11-22: qty 30

## 2016-11-22 MED ORDER — PRENATAL MULTIVITAMIN CH
1.0000 | ORAL_TABLET | Freq: Every day | ORAL | Status: DC
  Administered 2016-11-23: 1 via ORAL
  Filled 2016-11-22: qty 1

## 2016-11-22 MED ORDER — SIMETHICONE 80 MG PO CHEW: 80.0000 mg | CHEWABLE_TABLET | ORAL | Status: DC | PRN

## 2016-11-22 MED ORDER — OXYCODONE-ACETAMINOPHEN 5-325 MG PO TABS: 1.0000 | ORAL_TABLET | ORAL | Status: DC | PRN

## 2016-11-22 MED ORDER — OXYTOCIN 40 UNITS IN LACTATED RINGERS INFUSION - SIMPLE MED
1.0000 m[IU]/min | INTRAVENOUS | Status: DC
  Administered 2016-11-22: 6 m[IU]/min via INTRAVENOUS
  Administered 2016-11-22: 2 m[IU]/min via INTRAVENOUS

## 2016-11-22 MED ORDER — SOD CITRATE-CITRIC ACID 500-334 MG/5ML PO SOLN: 30.0000 mL | ORAL | Status: DC | PRN

## 2016-11-22 MED ORDER — EPHEDRINE 5 MG/ML INJ
10.0000 mg | INTRAVENOUS | Status: DC | PRN
  Filled 2016-11-22: qty 2

## 2016-11-22 MED ORDER — METHYLERGONOVINE MALEATE 0.2 MG/ML IJ SOLN: 0.2000 mg | INTRAMUSCULAR | Status: DC | PRN

## 2016-11-22 MED ORDER — LACTATED RINGERS IV SOLN: 500.0000 mL | INTRAVENOUS | Status: DC | PRN

## 2016-11-22 MED ORDER — BETAMETHASONE SOD PHOS & ACET 6 (3-3) MG/ML IJ SUSP
12.0000 mg | Freq: Once | INTRAMUSCULAR | Status: AC
  Administered 2016-11-22: 12 mg via INTRAMUSCULAR
  Filled 2016-11-22: qty 2

## 2016-11-22 MED ORDER — ZOLPIDEM TARTRATE 5 MG PO TABS: 5.0000 mg | ORAL_TABLET | Freq: Every evening | ORAL | Status: DC | PRN

## 2016-11-22 MED ORDER — CEFAZOLIN SODIUM-DEXTROSE 2-4 GM/100ML-% IV SOLN
2.0000 g | Freq: Once | INTRAVENOUS | Status: AC
  Administered 2016-11-22: 2 g via INTRAVENOUS
  Filled 2016-11-22: qty 100

## 2016-11-22 MED ORDER — ONDANSETRON HCL 4 MG/2ML IJ SOLN: 4.0000 mg | Freq: Four times a day (QID) | INTRAMUSCULAR | Status: DC | PRN

## 2016-11-22 MED ORDER — OXYCODONE-ACETAMINOPHEN 5-325 MG PO TABS: 2.0000 | ORAL_TABLET | ORAL | Status: DC | PRN

## 2016-11-22 MED ORDER — PHENYLEPHRINE 40 MCG/ML (10ML) SYRINGE FOR IV PUSH (FOR BLOOD PRESSURE SUPPORT)
80.0000 ug | PREFILLED_SYRINGE | INTRAVENOUS | Status: DC | PRN
  Filled 2016-11-22: qty 10
  Filled 2016-11-22: qty 5

## 2016-11-22 MED ORDER — OXYTOCIN BOLUS FROM INFUSION
500.0000 mL | Freq: Once | INTRAVENOUS | Status: AC
  Administered 2016-11-22: 500 mL via INTRAVENOUS

## 2016-11-22 MED ORDER — ONDANSETRON HCL 4 MG PO TABS: 4.0000 mg | ORAL_TABLET | ORAL | Status: DC | PRN

## 2016-11-22 MED ORDER — CEFAZOLIN IN D5W 1 GM/50ML IV SOLN
1.0000 g | Freq: Three times a day (TID) | INTRAVENOUS | Status: DC
  Filled 2016-11-22 (×2): qty 50

## 2016-11-22 MED ORDER — FENTANYL CITRATE (PF) 100 MCG/2ML IJ SOLN
100.0000 ug | INTRAMUSCULAR | Status: DC | PRN
  Administered 2016-11-22: 100 ug via INTRAVENOUS
  Filled 2016-11-22: qty 2

## 2016-11-22 MED ORDER — LACTATED RINGERS IV SOLN
INTRAVENOUS | Status: DC
  Administered 2016-11-22 (×2): via INTRAVENOUS

## 2016-11-22 MED ORDER — TERBUTALINE SULFATE 1 MG/ML IJ SOLN
0.2500 mg | Freq: Once | INTRAMUSCULAR | Status: DC | PRN
Start: 2016-11-22 — End: 2016-11-22
  Filled 2016-11-22: qty 1

## 2016-11-22 MED ORDER — DIPHENHYDRAMINE HCL 50 MG/ML IJ SOLN: 12.5000 mg | INTRAMUSCULAR | Status: DC | PRN

## 2016-11-22 MED ORDER — ACETAMINOPHEN 325 MG PO TABS: 650.0000 mg | ORAL_TABLET | ORAL | Status: DC | PRN

## 2016-11-22 MED ORDER — FENTANYL 2.5 MCG/ML BUPIVACAINE 1/10 % EPIDURAL INFUSION (WH - ANES)
14.0000 mL/h | INTRAMUSCULAR | Status: DC | PRN
  Filled 2016-11-22: qty 100

## 2016-11-22 MED ORDER — FLEET ENEMA 7-19 GM/118ML RE ENEM: 1.0000 | ENEMA | RECTAL | Status: DC | PRN

## 2016-11-22 MED ORDER — COCONUT OIL OIL: 1.0000 "application " | TOPICAL_OIL | Status: DC | PRN

## 2016-11-22 MED ORDER — DIPHENHYDRAMINE HCL 25 MG PO CAPS
25.0000 mg | ORAL_CAPSULE | Freq: Four times a day (QID) | ORAL | Status: DC | PRN
  Filled 2016-11-22: qty 1

## 2016-11-22 MED ORDER — ONDANSETRON HCL 4 MG/2ML IJ SOLN: 4.0000 mg | INTRAMUSCULAR | Status: DC | PRN

## 2016-11-22 MED ORDER — OXYCODONE HCL 5 MG PO TABS
10.0000 mg | ORAL_TABLET | ORAL | Status: DC | PRN
  Administered 2016-11-23 (×3): 10 mg via ORAL
  Filled 2016-11-22 (×3): qty 2

## 2016-11-22 MED ORDER — LACTATED RINGERS IV SOLN
500.0000 mL | Freq: Once | INTRAVENOUS | Status: AC
  Administered 2016-11-22: 500 mL via INTRAVENOUS

## 2016-11-22 MED ORDER — OXYTOCIN 40 UNITS IN LACTATED RINGERS INFUSION - SIMPLE MED
2.5000 [IU]/h | INTRAVENOUS | Status: DC
  Filled 2016-11-22: qty 1000

## 2016-11-22 MED ORDER — OXYCODONE HCL 5 MG PO TABS
5.0000 mg | ORAL_TABLET | ORAL | Status: DC | PRN
  Administered 2016-11-22: 5 mg via ORAL
  Filled 2016-11-22: qty 1

## 2016-11-22 MED ORDER — TETANUS-DIPHTH-ACELL PERTUSSIS 5-2.5-18.5 LF-MCG/0.5 IM SUSP: 0.5000 mL | Freq: Once | INTRAMUSCULAR | Status: DC

## 2016-11-22 MED ORDER — IBUPROFEN 600 MG PO TABS
600.0000 mg | ORAL_TABLET | Freq: Four times a day (QID) | ORAL | Status: DC
  Administered 2016-11-22 – 2016-11-24 (×7): 600 mg via ORAL
  Filled 2016-11-22 (×7): qty 1

## 2016-11-22 MED ORDER — BENZOCAINE-MENTHOL 20-0.5 % EX AERO
1.0000 "application " | INHALATION_SPRAY | CUTANEOUS | Status: DC | PRN
  Administered 2016-11-22: 1 via TOPICAL
  Filled 2016-11-22: qty 56

## 2016-11-22 NOTE — H&P (Signed)
LABOR AND DELIVERY ADMISSION HISTORY AND PHYSICAL NOTE  Brandy [name] is a 21 y.o. female H0Q6578 with IUP at [redacted]w[redacted]d by 11 wk [place] presenting for SROM.   Patient states she was laying in bed and felt fluid trickling down her perineum, but no gush. Has been constantly leaking. She reports positive fetal movement. She denies vaginal bleeding. Very infrequent contractions.  Prenatal History/Complications: Chlamydia, treated with negative TOC.   Past Medical History: Past Medical History:  Diagnosis Date  . Chlamydia   . Heart murmur     Past Surgical History: Past Surgical History:  Procedure Laterality Date  . NO PAST SURGERIES      Obstetrical History: OB History    Gravida Para Term Preterm AB Living   SAB TAB Ectopic Multiple Live Births           2      Social History: Social History   Social History  . Marital status: Single    Spouse name: N/A  . Number of children: N/A  . Years of education: N/A   Social History Main Topics  . Smoking status: Never Smoker  . Smokeless tobacco: Never Used  . Alcohol use No  . Drug use: No  . Sexual activity: Not Currently    Birth control/ protection: None     Comment: last sex Feb 01 2015   Other Topics Concern  . None   Social History Narrative  . None    Family History: Family History  Problem Relation Age of Onset  . Asthma Mother     Allergies: Allergies  Allergen Reactions  . Kiwi Extract Swelling    Causes swelling of the mouth and throat.  Marland Kitchen Penicillins Hives    Has patient had a PCN reaction causing immediate rash, facial/tongue/throat swelling, SOB or lightheadedness with hypotension: Yes Has patient had a PCN reaction causing severe rash involving mucus membranes or skin necrosis: No Has patient had a PCN reaction that required hospitalization Yes Has patient had a PCN reaction occurring within the last 10 years: No If all of the above answers are "NO", then may proceed with  Cephalosporin use.   Josefina Do Allergy] Rash    Prescriptions Prior to Admission  Medication Sig Dispense Refill Last Dose  . acetaminophen (TYLENOL) 325 MG tablet Take 650 mg by mouth every 6 (six) hours as needed for mild pain or headache.   Taking  . ondansetron (ZOFRAN) 4 MG tablet Take 1 tablet (4 mg total) by mouth every 8 (eight) hours as needed for nausea or vomiting. 20 tablet 0 Taking  . Prenatal Vit-Fe Fumarate-FA (PRENATAL MULTIVITAMIN) TABS tablet Take 1 tablet by mouth daily at 12 noon.   Taking  . valACYclovir (VALTREX) 500 MG tablet Take 1 tablet (500 mg total) by mouth daily. 30 tablet 1 Taking     Review of Systems   All systems reviewed and negative except as stated in HPI  Physical Exam Blood pressure 126/79, pulse 89, temperature 98.6 F (37 C), temperature source Oral, resp. rate 16, height  (1.626 m), weight 152 lb 2 oz (69 kg), SpO2 100 %, unknown if currently breastfeeding. General appearance: alert, cooperative, appears stated age and no distress Lungs: clear to auscultation bilaterally Heart: regular rate and rhythm Abdomen: soft, non-tender; bowel sounds normal Extremities: No calf swelling or tenderness Presentation: cephalic SSE: no lesions noted, NEFG, +pooling, normal appearing cervix. Fetal monitoring: 120 bpm, mod var, +  accels, no decels Uterine activity: infrequent, rare     Prenatal labs: ABO, Rh: B/NEG/-- (12/19 0955) Antibody: POS (12/19 0955) Rubella: !Error! Immune RPR: Non Reactive (04/04 1414)  HBsAg: NEGATIVE (12/19 0955)  HIV: Non Reactive (04/04 1414)  GBS:   Pending 1 hr Glucola: 91 Genetic screening:  NEG Anatomy US: Normal  Prenatal Transfer Tool  Maternal Diabetes: No Genetic Screening: Normal Maternal Ultrasounds/Referrals: Normal Fetal Ultrasounds or other Referrals:  None Maternal Substance Abuse:  No Significant Maternal Medications:  None Significant Maternal Lab Results: Lab values include: Rh  negative; GBS unknown (pending)  No results found for this or any previous visit (from the past 24 hour(s)).  Patient Active Problem List   Diagnosis Date Noted  . Supervision of normal pregnancy 07/21/2016  . 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    Assessment: Brandy [name] is a 21 y.o. [id number] at [redacted]w[redacted]d here for PROM.   #Labor:PROM #Pain: IV pain meds prn, epidural on request #FWB: CAT I #ID:  GBS unknown (pending)- will treat with Cefazolin due to PCN allergy (hives as child) #MOF: bottle #MOC: IUD #Circ:  outpatient  Jen Mow, DO OB Fellow [facility], [facility] 11/22/2016, 6:00 AM

## 2016-11-22 NOTE — Progress Notes (Signed)
Epidural not charted by [facility];  Epidural removed 1430, catheter intact [employer], RN 11/22/2016 2:42 PM

## 2016-11-22 NOTE — Anesthesia Preprocedure Evaluation (Signed)
Anesthesia Evaluation  Patient identified by MRN, date of birth, ID band Patient awake    Reviewed: Allergy & Precautions, NPO status , Patient's Chart, lab work & pertinent test results  Airway Mallampati: II  TM Distance: >3 FB Neck ROM: Full    Dental no notable dental hx. (+) Teeth Intact   Pulmonary neg pulmonary ROS,    Pulmonary exam normal breath sounds clear to auscultation       Cardiovascular negative cardio ROS Normal cardiovascular exam+ Valvular Problems/Murmurs  Rhythm:Regular Rate:Normal     Neuro/Psych negative neurological ROS  negative psych ROS   GI/Hepatic negative GI ROS, Neg liver ROS,   Endo/Other  negative endocrine ROS  Renal/GU negative Renal ROS  negative genitourinary   Musculoskeletal negative musculoskeletal ROS (+)   Abdominal   Peds negative pediatric ROS (+)  Hematology  (+) anemia ,   Anesthesia Other Findings   Reproductive/Obstetrics (+) Pregnancy                             Lab Results  Component Value Date   WBC 6.7 11/22/2016   HGB 10.3 (L) 11/22/2016   HCT 31.7 (L) 11/22/2016   MCV 95.5 11/22/2016   PLT 209 11/22/2016   No results found for: INR, PROTIME   Anesthesia Physical  Anesthesia Plan  ASA: II  Anesthesia Plan: Epidural   Post-op Pain Management:    Induction:   Airway Management Planned: Natural Airway  Additional Equipment:   Intra-op Plan:   Post-operative Plan:   Informed Consent: I have reviewed the patients History and Physical, chart, labs and discussed the procedure including the risks, benefits and alternatives for the proposed anesthesia with the patient or authorized representative who has indicated his/her understanding and acceptance.   Dental advisory given  Plan Discussed with: Anesthesiologist  Anesthesia Plan Comments:         Anesthesia Quick Evaluation

## 2016-11-22 NOTE — Anesthesia Pain Management Evaluation Note (Addendum)
  CRNA Pain Management Visit Note  Patient: Brandy [name], 21 y.o., female  "Hello I am a member of the anesthesia team at [facility]. We have an anesthesia team available at all times to provide care throughout the hospital, including epidural management and anesthesia for C-section. I don't know your plan for the delivery whether it a natural birth, water birth, IV sedation, nitrous supplementation, doula or epidural, but we want to meet your pain goals."   1.Was your pain managed to your expectations on prior hospitalizations?   Yes   2.What is your expectation for pain management during this hospitalization?     Epidural  3.How can we help you reach that goal? Epidural prn  Record the patient's initial score and the patient's pain goal.   Pain: 7  Pain Goal: 7 [facility] wants you to be able to say your pain was always managed very well.  [facility] 11/22/2016

## 2016-11-22 NOTE — Progress Notes (Signed)
Brandy [name] is a 21 y.o. [id number] at [redacted]w[redacted]d by ultrasound admitted for SROM.  Subjective: Patient resting comfortably with family at bedside.   Objective: BP 99/60   Pulse 87   Temp 98.7 F (37.1 C) (Oral)   Resp 17   Ht  (1.626 m)   Wt 152 lb 2 oz (69 kg)   LMP  (LMP Unknown)   SpO2 100%   BMI 26.11 kg/m  No intake/output data recorded. No intake/output data recorded.  FHT:  FHR: 120 bpm, variability: moderate,  accelerations:  Present,  decelerations:  Absent UC:   irregular, every 5-7 minutes SVE:   Dilation: 3 Effacement (%): 70, 80 Station: -2 Exam by:: middleton rn Pitocin at 61mu/min   Assessment / Plan: Induction of labor due to PROM,  progressing well on pitocin  Labor: Progressing on Pitocin, will continue to increase then AROM Preeclampsia:  no signs or symptoms of toxicity Fetal Wellbeing:  Category I Pain Control:  Labor support without medications I/D:  n/a Anticipated MOD:  NSVD  Cleone Slim 11/22/2016, 10:07 AM

## 2016-11-22 NOTE — Progress Notes (Signed)
RN sent message to pharmacy to send Ancef for unknown GBS status at 0749. Pharmacy called RN, stating that they had not sent med due to allergy to PCN. RN spoke with Dr Omer Jack at [phone], and then patient at 0800. Patient states she only gets hives/rash with PCN. RN then called pharmacy again, requesting medication be sent at 0820. RN sent message to pharmacy again, at [phone], requesting medication and reminding pharmacy that patient is a G3P2 who is ruptured and in labor. Brandy [name], [place] 11/22/2016 8:46 AM

## 2016-11-22 NOTE — Progress Notes (Signed)
Patient ready for transfer to [facility] at 1430. Awaiting room cleaning. 11/22/2016 2:43 PM

## 2016-11-22 NOTE — MAU Note (Signed)
Leaking at 0350, clear fluid.  No bleeding. 1 cm at last visit last week.  Baby not moving much, not been paying attention.  No pain.

## 2016-11-23 LAB — STREP GP B CULTURE+RFLX: Strep Gp B Culture+Rflx: NEGATIVE

## 2016-11-23 MED ORDER — ACETAMINOPHEN 160 MG/5ML PO SOLN: 650.0000 mg | ORAL | Status: DC | PRN

## 2016-11-23 MED ORDER — IBUPROFEN 100 MG/5ML PO SUSP
600.0000 mg | Freq: Four times a day (QID) | ORAL | Status: DC
  Administered 2016-11-24: 600 mg via ORAL
  Filled 2016-11-23 (×6): qty 30

## 2016-11-23 MED ORDER — RHO D IMMUNE GLOBULIN 1500 UNIT/2ML IJ SOSY
300.0000 ug | PREFILLED_SYRINGE | Freq: Once | INTRAMUSCULAR | Status: AC
  Administered 2016-11-23: 300 ug via INTRAVENOUS
  Filled 2016-11-23: qty 2

## 2016-11-23 NOTE — Progress Notes (Signed)
POSTPARTUM PROGRESS NOTE  Post Partum Day #1 Subjective:  Brandy [name] is a 21 y.o. [id number] [redacted]w[redacted]d s/p SVD.  No acute events overnight.  Pt denies problems with ambulating, voiding or po intake.  She denies nausea or vomiting.  Pain is poorly controlled.  She has had flatus. She has had bowel movement.  Lochia Minimal.   Objective: Blood pressure (!) 106/50, pulse 61, temperature 97.6 F (36.4 C), temperature source Oral, resp. rate 18, height  (1.626 m), weight 152 lb 2 oz (69 kg), SpO2 97 %, unknown if currently breastfeeding.  Physical Exam:  General: alert, cooperative and no distress Lochia:normal flow Chest: no respiratory distress Heart:regular rate, distal pulses intact Abdomen: soft, nontender,  Uterine Fundus: firm, appropriately tender DVT Evaluation: No calf swelling or tenderness Extremities: minimal edema   Recent Labs  11/22/16 0621  HGB 10.3*  HCT 31.7*    Assessment/Plan:  ASSESSMENT: Brandy [name] is a 21 y.o. [id number] [redacted]w[redacted]d s/p SVD.  Plan for discharge tomorrow Rhogam today   LOS: 1 day   Abdoulaye DialloMD 11/23/2016, 8:11 AM  OB FELLOW POSTPARTUM PROGRESS NOTE ATTESTATION  I confirm that I have verified the information documented in the resident's note and that I have also personally reperformed the physical exam and all medical decision making activities.     Ernestina Penna, MD 9:31 AM

## 2016-11-23 NOTE — Anesthesia Postprocedure Evaluation (Signed)
Anesthesia Post Note  Patient: Brandy [name]  Procedure(s) Performed: * No procedures listed *  Patient location during evaluation: Mother Baby Anesthesia Type: Epidural Level of consciousness: awake Pain management: pain level controlled Vital Signs Assessment: post-procedure vital signs reviewed and stable Respiratory status: spontaneous breathing Cardiovascular status: stable Postop Assessment: no headache, no backache, epidural receding, patient able to bend at knees and no signs of nausea or vomiting        Last Vitals:  Vitals:   11/23/16 0038 11/23/16 0641  BP: (!) 92/50 (!) 106/50  Pulse: 62 61  Resp: 18 18  Temp: 36.8 C 36.4 C    Last Pain:  Vitals:   11/23/16 0700  TempSrc:   PainSc: 0-No pain   Pain Goal: Patients Stated Pain Goal: 2 (11/22/16 2035)               Edison Pace

## 2016-11-23 NOTE — Progress Notes (Signed)
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

## 2016-11-24 ENCOUNTER — Encounter: Payer: Self-pay | Admitting: General Practice

## 2016-11-24 LAB — RH IG WORKUP (INCLUDES ABO/RH)
ABO/RH(D): B NEG
Fetal Screen: NEGATIVE
GESTATIONAL AGE(WKS): 36.5
Unit division: 0

## 2016-11-24 MED ORDER — COMPLETENATE 29-1 MG PO CHEW
1.0000 | CHEWABLE_TABLET | Freq: Every day | ORAL | Status: DC
  Filled 2016-11-24: qty 1

## 2016-11-24 MED ORDER — LIDOCAINE HCL (PF) 1 % IJ SOLN
INTRAMUSCULAR | Status: DC | PRN
  Administered 2016-11-22 (×2): 4 mL via EPIDURAL

## 2016-11-24 MED ORDER — FENTANYL 2.5 MCG/ML BUPIVACAINE 1/10 % EPIDURAL INFUSION (WH - ANES)
INTRAMUSCULAR | Status: DC | PRN
  Administered 2016-11-22: 14 mL/h via EPIDURAL

## 2016-11-24 NOTE — Anesthesia Procedure Notes (Signed)
Epidural Patient location during procedure: OB Start time: 11/22/2016 10:48 AM  Staffing Anesthesiologist: Mal Amabile Performed: anesthesiologist   Preanesthetic Checklist Completed: patient identified, site marked, surgical consent, pre-op evaluation, timeout performed, IV checked, risks and benefits discussed and monitors and equipment checked  Epidural Patient position: sitting Prep: site prepped and draped and DuraPrep Patient monitoring: continuous pulse ox and blood pressure Approach: midline Location: L3-L4 Injection technique: LOR air  Needle:  Needle type: Tuohy  Needle gauge: 17 G Needle length: 9 cm and 9 Needle insertion depth: 4 cm Catheter type: closed end flexible Catheter size: 19 Gauge Catheter at skin depth: 9 cm Test dose: negative and Other  Assessment Events: blood not aspirated, injection not painful, no injection resistance, negative IV test and no paresthesia  Additional Notes Patient identified. Risks and benefits discussed including failed block, incomplete  Pain control, post dural puncture headache, nerve damage, paralysis, blood pressure Changes, nausea, vomiting, reactions to medications-both toxic and allergic and post Partum back pain. All questions were answered. Patient expressed understanding and wished to proceed. Sterile technique was used throughout procedure. Epidural site was Dressed with sterile barrier dressing. No paresthesias, signs of intravascular injection Or signs of intrathecal spread were encountered.  Patient was more comfortable after the epidural was dosed. Please see RN's note for documentation of vital signs and FHR which are stable.

## 2016-11-24 NOTE — Discharge Summary (Signed)
OB Discharge Summary     Patient Name: Brandy [name] DOB: [birth year] MRN: [id number]  Date of admission: 11/22/2016 Delivering MD: Rolm Bookbinder   Date of discharge: 11/24/2016  Admitting diagnosis: 36WKS LEAKING  Intrauterine pregnancy: [redacted]w[redacted]d     Secondary diagnosis:  Active Problems:   Normal labor  Additional problems:None     Discharge diagnosis: Term Pregnancy Delivered                                                                                                Post partum procedures:None  Augmentation: N/A  Complications: None  Hospital course:  Onset of Labor With Vaginal Delivery     21 y.o. yo [id number] at [redacted]w[redacted]d was admitted in Active Labor on 11/22/2016. Patient had an uncomplicated labor course as follows:  Membrane Rupture Time/Date: 3:15 AM ,11/22/2016   Intrapartum Procedures: Episiotomy: None [1]                                         Lacerations:  None [1]  Patient had a delivery of a Viable infant. 11/22/2016  Information for the patient's newborn:  Brandy [name], [name] [[id number]]  Delivery Method: Vaginal, Spontaneous Delivery (Filed from Delivery Summary)    Pateint had an uncomplicated postpartum course.  She is ambulating, tolerating a regular diet, passing flatus, and urinating well. Patient is discharged home in stable condition on 11/24/16.   Physical exam  Vitals:   11/23/16 0038 11/23/16 0641 11/23/16 1759 11/24/16 0539  BP: (!) 92/50 (!) 106/50 101/62 120/60  Pulse: 62 61 95 (!) 56  Resp: Temp: 98.2 F (36.8 C) 97.6 F (36.4 C) 97.8 F (36.6 C) 97.9 F (36.6 C)  TempSrc:  Oral Oral Oral  SpO2: 97%  100%   Weight:      Height:       General: alert, cooperative and no distress Lochia: appropriate Uterine Fundus: firm Incision: Healing well with no significant drainage DVT Evaluation: No evidence of DVT seen on physical exam. Labs: Lab Results  Component Value Date   WBC 6.7 11/22/2016   HGB 10.3 (L) 11/22/2016    HCT 31.7 (L) 11/22/2016   MCV 95.5 11/22/2016   PLT 209 11/22/2016   CMP Latest Ref Rng & Units 06/15/2016  Glucose 65 - 99 mg/dL 130(Q)  BUN 6 - 20 mg/dL 8  Creatinine 6.57 - 8.46 mg/dL 9.62  Sodium 952 - 841 mmol/L 135  Potassium 3.5 - 5.1 mmol/L 3.4(L)  Chloride 101 - 111 mmol/L 104  CO2 22 - 32 mmol/L 23  Calcium 8.9 - 10.3 mg/dL 9.1  Total Protein 6.5 - 8.1 g/dL 7.1  Total Bilirubin 0.3 - 1.2 mg/dL 0.5  Alkaline Phos 38 - 126 U/L 44  AST 15 - 41 U/L 19  ALT 14 - 54 U/L 13(L)    Discharge instruction: per After Visit Summary and "Baby and Me Booklet".  After visit meds:  Allergies as of 11/24/2016  Reactions   Kiwi Extract Anaphylaxis   Penicillins Hives, Other (See Comments)   Has patient had a PCN reaction causing immediate rash, facial/tongue/throat swelling, SOB or lightheadedness with hypotension: Yes Has patient had a PCN reaction causing severe rash involving mucus membranes or skin necrosis: No Has patient had a PCN reaction that required hospitalization No Has patient had a PCN reaction occurring within the last 10 years: No If all of the above answers are "NO", then may proceed with Cephalosporin use.   Lobster [shellfish Allergy] Rash      Medication List    STOP taking these medications   ondansetron 4 MG tablet Commonly known as:  ZOFRAN   PRENATAL GUMMIES/DHA & FA 0.4-32.5 MG Chew   valACYclovir 500 MG tablet Commonly known as:  VALTREX       Diet: routine diet  Activity: Advance as tolerated. Pelvic rest for 6 weeks.   Outpatient follow up:6 weeks Follow up Appt:No future appointments. Follow up Visit:No Follow-up on file.  Postpartum contraception: Nexplanon  Newborn Data: Live born female  Birth Weight: 5 lb 8.7 oz (2515 g) APGAR: 7, 9  Baby Feeding: Bottle Disposition:home with mother   11/24/2016 Brandy Neighbours, MD  Midwife attestation I have seen and examined this patient and agree with above documentation in the  resident's note.   Brandy [name] is a 21 y.o. [id number] s/p SVD.   Pain is well controlled.  Plan for birth control is Nexplanon.  Method of Feeding: bottle  PE:  Gen: well appearing Heart: reg rate Lungs: normal WOB Fundus firm Ext: soft, no pain, no edema   Recent Labs  11/22/16 0621  HGB 10.3*  HCT 31.7*     Assessment - discharge today  Plan: - postpartum care discussed - f/u clinic in 6 weeks for postpartum visit   Brandy [name], CNM 8:47 AM

## 2016-11-24 NOTE — Discharge Instructions (Signed)

## 2016-11-26 LAB — TYPE AND SCREEN
ABO/RH(D): B NEG
Antibody Screen: POSITIVE
DAT, IGG: NEGATIVE
Unit division: 0
Unit division: 0

## 2016-11-26 LAB — BPAM RBC
Blood Product Expiration Date: 201805072359
Blood Product Expiration Date: 201805082359
UNIT TYPE AND RH: 9500
UNIT TYPE AND RH: 9500

## 2016-11-30 ENCOUNTER — Encounter: Payer: Medicaid Other | Admitting: Obstetrics & Gynecology

## 2017-01-05 ENCOUNTER — Encounter: Payer: Self-pay | Admitting: Advanced Practice Midwife

## 2017-01-05 ENCOUNTER — Ambulatory Visit (INDEPENDENT_AMBULATORY_CARE_PROVIDER_SITE_OTHER): Payer: Medicaid Other | Admitting: Advanced Practice Midwife

## 2017-01-05 ENCOUNTER — Ambulatory Visit: Payer: Medicaid Other | Admitting: Medical

## 2017-01-05 DIAGNOSIS — Z30430 Encounter for insertion of intrauterine contraceptive device: Secondary | ICD-10-CM | POA: Diagnosis not present

## 2017-01-05 MED ORDER — LEVONORGESTREL 18.6 MCG/DAY IU IUD
INTRAUTERINE_SYSTEM | Freq: Once | INTRAUTERINE | Status: AC
  Administered 2017-01-05: 17:00:00 via INTRAUTERINE

## 2017-01-05 NOTE — Patient Instructions (Signed)

## 2017-01-05 NOTE — Progress Notes (Signed)
Subjective:     Brandy [name] is a 21 y.o. female who presents for a postpartum visit. She is 6 weeks postpartum following a spontaneous vaginal delivery. I have fully reviewed the prenatal and intrapartum course. The delivery was at 36 gestational weeks. Outcome: spontaneous vaginal delivery. Anesthesia: epidural. Postpartum course has been uncomplicated. Baby's course has been uncomplicated. Baby is feeding by bottle - Neocate. Bleeding None. Bowel function is complictated by constipation. Bladder function is normal. Patient is sexually active. Contraception method is condoms. Postpartum depression screening: negative.  The following portions of the patient's history were reviewed and updated as appropriate: allergies, current medications, past family history, past medical history, past social history, past surgical history and problem list.  Review of Systems Pertinent items are noted in HPI.   Objective:    There were no vitals taken for this visit.  General:  alert, cooperative and no distress   Breasts:  inspection negative, no nipple discharge or bleeding, no masses or nodularity palpable  Lungs: clear to auscultation bilaterally  Heart:  regular rate and rhythm, S1, S2 normal, no murmur, click, rub or gallop  Abdomen: soft, non-tender; bowel sounds normal; no masses,  no organomegaly   Vulva:  normal  Vagina: normal vagina, no discharge, exudate, lesion, or erythema  Cervix:  multiparous appearance and no cervical motion tenderness  Corpus: normal size, contour, position, consistency, mobility, non-tender  Adnexa:  no mass, fullness, tenderness  Rectal Exam: Not performed.        Procedure note: Patient identified, informed consent performed, signed copy in chart, time out was performed.  Urine pregnancy test negative.  Speculum placed in the vagina.  Cervix visualized.  Cleaned with Betadine x 2.  Grasped anteriourly with a single tooth tenaculum.  Uterus sounded to 8 cm.   Mirena IUD placed per manufacturer's recommendations.  Strings trimmed to 3 cm.   Patient given post procedure instructions and Mirena care card with expiration date.  Patient is asked to check IUD strings periodically and follow up in 4-6 weeks for IUD check. Assessment:     Nml postpartum exam. Pap smear not done at today's visit.    1. Postpartum care and examination  - levonorgestrel (LILETTA) 18.6 MCG/DAY IUD; by Intrauterine route once.  Plan:    1. Contraception: IUD 2. String check in 6 weeks 3. Rx IBU 4. Condoms x 1 week

## 2017-01-07 MED ORDER — IBUPROFEN 600 MG PO TABS: 600.0000 mg | ORAL_TABLET | Freq: Four times a day (QID) | ORAL | 1 refills | Status: DC | PRN

## 2017-02-02 ENCOUNTER — Ambulatory Visit: Payer: Medicaid Other | Admitting: Advanced Practice Midwife

## 2017-10-13 ENCOUNTER — Ambulatory Visit (INDEPENDENT_AMBULATORY_CARE_PROVIDER_SITE_OTHER): Payer: Self-pay | Admitting: General Practice

## 2017-10-13 DIAGNOSIS — Z3202 Encounter for pregnancy test, result negative: Secondary | ICD-10-CM

## 2017-10-13 LAB — POCT PREGNANCY, URINE: PREG TEST UR: NEGATIVE

## 2017-10-13 NOTE — Progress Notes (Signed)
Patient here for UPT today. UPT -. Patient reports positive home test on Sunday. States she uses IUD for birth control. Patient reports she has been feeling nauseous and having hot flashes and that's why she did a pregnancy test. Recommended she follow up with a PCP. Patient verbalized understanding & had no questions.

## 2017-11-17 ENCOUNTER — Ambulatory Visit: Payer: Self-pay | Admitting: Advanced Practice Midwife

## 2018-04-04 ENCOUNTER — Encounter (HOSPITAL_COMMUNITY): Payer: Self-pay

## 2018-04-04 ENCOUNTER — Ambulatory Visit (INDEPENDENT_AMBULATORY_CARE_PROVIDER_SITE_OTHER): Payer: Self-pay

## 2018-04-04 ENCOUNTER — Ambulatory Visit (HOSPITAL_COMMUNITY)
Admission: EM | Admit: 2018-04-04 | Discharge: 2018-04-04 | Disposition: A | Discharge status: Home / Self Care | Payer: Self-pay | Attending: Family Medicine | Admitting: Family Medicine

## 2018-04-04 DIAGNOSIS — S92535A Nondisplaced fracture of distal phalanx of left lesser toe(s), initial encounter for closed fracture: Secondary | ICD-10-CM

## 2018-04-04 MED ORDER — IBUPROFEN 800 MG PO TABS: 800.0000 mg | ORAL_TABLET | Freq: Three times a day (TID) | ORAL | 0 refills | Status: DC

## 2018-04-04 NOTE — ED Triage Notes (Signed)
Pt presents with right side  toe injury from jamming it into a wall.

## 2018-04-04 NOTE — Discharge Instructions (Signed)
Wear the post op shoe.

## 2018-04-04 NOTE — ED Notes (Signed)
Pt given free post op shoe

## 2018-04-04 NOTE — ED Provider Notes (Signed)
04/04/2018 8:05 PM   DOB: Dec 28, 1995 / MRN: [id number]  SUBJECTIVE:  Brandy [name] is a 22 y.o. female presenting for toe pain that started today after a stubbing injury that started today.  Assoicates difficulty with walking.     She is allergic to kiwi extract; penicillins; and lobster [shellfish allergy].   She  has a past medical history of Chlamydia and Heart murmur.    She  reports that she has never smoked. She has never used smokeless tobacco. She reports that she does not drink alcohol or use drugs. She  reports that she currently engages in sexual activity. She reports using the following methods of birth control/protection: None and Condom. The patient  has a past surgical history that includes No past surgeries.  Her family history includes Asthma in her mother.  ROS Per HPI  OBJECTIVE:  BP 104/63 (BP Location: Right Arm)   Pulse 65   Temp 97.8 F (36.6 C) (Oral)   Resp 20   LMP 03/26/2018   SpO2 100%   Wt Readings from Last 3 Encounters:  01/05/17 136 lb 11.2 oz (62 kg)  11/22/16 152 lb 2 oz (69 kg)  11/19/16 151 lb 6.4 oz (68.7 kg)   Temp Readings from Last 3 Encounters:  04/04/18 97.8 F (36.6 C) (Oral)  11/24/16 97.9 F (36.6 C) (Oral)  09/19/16 97.3 F (36.3 C) (Axillary)   BP Readings from Last 3 Encounters:  04/04/18 104/63  01/05/17 123/64  11/24/16 120/60   Pulse Readings from Last 3 Encounters:  04/04/18 65  01/05/17 (!) 55  11/24/16 (!) 56    Physical Exam  No results found for this or any previous visit (from the past 72 hour(s)).  Dg Foot Complete Right  Result Date: 04/04/2018 CLINICAL DATA:  Foot injury EXAM: RIGHT FOOT COMPLETE - 3+ VIEW COMPARISON:  None. FINDINGS: Acute intra-articular fracture medial base of the fourth distal phalanx. No subluxation. No radiopaque foreign body IMPRESSION: Acute intra-articular fracture involving the base of the fourth distal phalanx Electronically Signed   By: Brandy [name] M.D.   On: 04/04/2018 19:52     ASSESSMENT AND PLAN:   Closed nondisplaced fracture of distal phalanx of lesser toe of left foot, initial encounter Post op shoe.   Discharge Instructions   None        The patient is advised to call or return to clinic if she does not see an improvement in symptoms, or to seek the care of the closest emergency department if she worsens with the above plan.   Brandy [name], MHS, PA-C 04/04/2018 8:05 PM   Brandy Neas, PA-C 04/04/18 2005

## 2018-10-09 ENCOUNTER — Other Ambulatory Visit: Payer: Self-pay

## 2018-10-09 ENCOUNTER — Encounter (HOSPITAL_COMMUNITY): Payer: Self-pay | Admitting: *Deleted

## 2018-10-09 ENCOUNTER — Emergency Department (HOSPITAL_COMMUNITY): Payer: No Typology Code available for payment source

## 2018-10-09 ENCOUNTER — Emergency Department (HOSPITAL_COMMUNITY)
Admission: EM | Admit: 2018-10-09 | Discharge: 2018-10-09 | Disposition: A | Discharge status: Home / Self Care | Payer: No Typology Code available for payment source | Attending: Emergency Medicine | Admitting: Emergency Medicine

## 2018-10-09 DIAGNOSIS — S0990XA Unspecified injury of head, initial encounter: Secondary | ICD-10-CM

## 2018-10-09 DIAGNOSIS — M25511 Pain in right shoulder: Secondary | ICD-10-CM | POA: Insufficient documentation

## 2018-10-09 DIAGNOSIS — R51 Headache: Secondary | ICD-10-CM | POA: Diagnosis not present

## 2018-10-09 MED ORDER — IBUPROFEN 200 MG PO TABS
600.0000 mg | ORAL_TABLET | Freq: Once | ORAL | Status: AC
  Administered 2018-10-09: 600 mg via ORAL
  Filled 2018-10-09: qty 3
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
## 2018-10-09 NOTE — ED Notes (Signed)
Bed: [facility] Expected date:  Expected time:  Means of arrival:  Comments: 

## 2018-10-09 NOTE — Discharge Instructions (Addendum)
Please take ibuprofen and Tylenol for the pain °
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
## 2018-10-26 ENCOUNTER — Telehealth: Payer: Self-pay | Admitting: Advanced Practice Midwife

## 2018-10-26 ENCOUNTER — Telehealth: Payer: Self-pay | Admitting: Obstetrics and Gynecology

## 2018-10-26 NOTE — Telephone Encounter (Signed)
Called the patient in regards to a message she left with our call center. Informed the patient if she has any questions or concerns to call our clinic.  - The message stated she would like an appointment to remove the IUD and be placed on birth control pills.

## 2018-10-26 NOTE — Telephone Encounter (Signed)
Patient called to make an appointment. Was given her the information about COVID-19. She stated she would call back in May.

## 2018-11-15 ENCOUNTER — Telehealth: Payer: Self-pay | Admitting: Family Medicine

## 2018-11-15 NOTE — Telephone Encounter (Signed)
Patient called in stating that she would like her birth control removed. Informed patient that due to the COVID-10 we are prolonging appointments out to the end of May so help prevent healthy patients from coming in the office as much as possible. Patient understood, but stated the reason she wants to have the birth control removed is because she is having abdominal pain and she would like to try a different kind. Patient advised that the message would be forwarded to the nurses and the would be reaching out to her to discuss what her options are. Patient verbalized understanding.

## 2018-11-21 NOTE — Telephone Encounter (Signed)
Pt reports 9/10 cramping pain that comes and goes, even when she is not having her period.  Pt denies abnormal bleeding.  Pt reports she was bleeding heavily after she got it placed in 2018 but it has since stopped and has had the cramping since placement.  Pt reports she has occasional stabbing abdominal pain.  Pt reports she takes tylenol, motrin, and a heating pad to help with the cramping but nothing works except time.  Advised pt that we are not currently scheduling IUD removals d/t Covid-19 restrictions.  Pt verbalized understanding.

## 2018-12-01 ENCOUNTER — Encounter: Payer: Self-pay | Admitting: *Deleted

## 2018-12-05 ENCOUNTER — Other Ambulatory Visit: Payer: Self-pay

## 2018-12-05 ENCOUNTER — Encounter (HOSPITAL_COMMUNITY): Payer: Self-pay | Admitting: Emergency Medicine

## 2018-12-05 ENCOUNTER — Emergency Department (HOSPITAL_COMMUNITY)
Admission: EM | Admit: 2018-12-05 | Discharge: 2018-12-06 | Disposition: A | Discharge status: Home / Self Care | Payer: Medicaid Other | Attending: Emergency Medicine | Admitting: Emergency Medicine

## 2018-12-05 DIAGNOSIS — R319 Hematuria, unspecified: Secondary | ICD-10-CM | POA: Insufficient documentation

## 2018-12-05 DIAGNOSIS — N390 Urinary tract infection, site not specified: Secondary | ICD-10-CM | POA: Diagnosis not present

## 2018-12-05 DIAGNOSIS — R102 Pelvic and perineal pain: Secondary | ICD-10-CM

## 2018-12-05 LAB — URINALYSIS, ROUTINE W REFLEX MICROSCOPIC
Bilirubin Urine: NEGATIVE
Glucose, UA: NEGATIVE mg/dL
Ketones, ur: NEGATIVE mg/dL
Nitrite: NEGATIVE
Protein, ur: NEGATIVE mg/dL
Specific Gravity, Urine: 1.027 (ref 1.005–1.030)
WBC, UA: >50 WBC/hpf — ABNORMAL HIGH (ref 0–5)
pH: 6 (ref 5.0–8.0)

## 2018-12-05 LAB — I-STAT BETA HCG BLOOD, ED (MC, WL, AP ONLY): I-stat hCG, quantitative: <5 m[IU]/mL (ref <5)

## 2018-12-05 NOTE — ED Triage Notes (Signed)
Pt reports abdominal and pelvic pain x several months, has been trying to follow up with [facility] to see if her IUD is in the right place, took yeast infection medication 3 days ago for itchy and white discharge, states the itching is gone, reports blood and pain with urination yesterday. States she checked the strings on her IUD yesterday and thinks they are shorter than normal.

## 2018-12-06 LAB — GC/CHLAMYDIA PROBE AMP (~~LOC~~) NOT AT ARMC
Chlamydia: NEGATIVE
Neisseria Gonorrhea: POSITIVE — AB

## 2018-12-06 LAB — URINE CULTURE

## 2018-12-06 MED ORDER — CEPHALEXIN 500 MG PO CAPS: 500.0000 mg | ORAL_CAPSULE | Freq: Two times a day (BID) | ORAL | 0 refills | Status: DC

## 2018-12-06 NOTE — Discharge Instructions (Signed)
Follow-up with your STD testing in approximately 2 days. Take antibiotics as directed.  Return for persistent vomiting, persistent fever, flank pain or new concerns.

## 2018-12-06 NOTE — ED Notes (Signed)
ED Provider at bedside. 

## 2018-12-06 NOTE — ED Provider Notes (Signed)
[facility] EMERGENCY DEPARTMENT Provider Note   CSN: [id number] Arrival date & time: 12/05/18  2208    History   Chief Complaint Chief Complaint  Patient presents with  . Pelvic Pain    HPI Brandy [name] is a 23 y.o. female.     Patient presents for assessment of intermittent pelvic pain and mild dysuria.  Patient's had intermittent discomfort and is trying to get into women's clinic for appointment.  Patient would like her IUD removed she could feel the string but it was not as long recently.  Patient denies any concerns for STD, no new sexual partners.  Currently no vaginal discharge or bleeding.  Mild intermittent cramping suprapubic     Past Medical History:  Diagnosis Date  . Chlamydia   . Heart murmur     Patient Active Problem List   Diagnosis Date Noted  . Normal labor 11/22/2016  . Supervision of normal pregnancy 07/21/2016  . 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 NO PAST SURGERIES       OB History    Gravida  3   Para  3   Term  1   Preterm  2   AB      Living  3     SAB      TAB      Ectopic      Multiple  0   Live Births  3            Home Medications    Prior to Admission medications   Medication Sig Start Date End Date Taking? Authorizing Provider  cephALEXin (KEFLEX) 500 MG capsule Take 1 capsule (500 mg total) by mouth 2 (two) times daily. 12/06/18   Blane Ohara, MD  ibuprofen (ADVIL,MOTRIN) 800 MG tablet Take 1 tablet (800 mg total) by mouth 3 (three) times daily. Patient not taking: Reported on 10/09/2018 04/04/18   [name], PA-C    Family History Family History  Problem Relation Age of Onset  . Asthma Mother     Social History Social History   Tobacco Use  . Smoking status: Never Smoker  . Smokeless tobacco: Never Used  Substance Use Topics  . Alcohol use:  No  . Drug use: No     Allergies   Kiwi extract; Penicillins; and Lobster [shellfish allergy]   Review of Systems Review of Systems  Constitutional: Negative for chills and fever.  Cardiovascular: Negative for chest pain.  Gastrointestinal: Negative for abdominal pain and vomiting.  Genitourinary: Positive for dysuria and pelvic pain. Negative for flank pain and vaginal discharge.  Musculoskeletal: Negative for back pain, neck pain and neck stiffness.  Skin: Negative for rash.  Neurological: Negative for light-headedness and headaches.     Physical Exam Updated Vital Signs BP 114/80 (BP Location: Right Arm)   Pulse 77   Temp 98.8 F (37.1 C) (Oral)   Resp 18   LMP 11/28/2018 (Exact Date)   SpO2 100%   Physical Exam Vitals signs and nursing note reviewed.  Constitutional:      Appearance: She is well-developed.  HENT:     Head: Normocephalic and atraumatic.  Eyes:     General:        Right eye: No discharge.        Left eye: No discharge.     Conjunctiva/sclera: Conjunctivae normal.  Neck:  Trachea: No tracheal deviation.  Cardiovascular:     Rate and Rhythm: Normal rate.  Pulmonary:     Effort: Pulmonary effort is normal.  Abdominal:     General: There is no distension.     Palpations: Abdomen is soft.     Tenderness: There is abdominal tenderness (mild suprapubic). There is no guarding.  Genitourinary:    Comments: Minimal discharge, IUD visualized, no cmt Skin:    General: Skin is warm.     Findings: No rash.  Neurological:     Mental Status: She is alert and oriented to person, place, and time.      ED Treatments / Results  Labs (all labs ordered are listed, but only abnormal results are displayed) Labs Reviewed  URINALYSIS, ROUTINE W REFLEX MICROSCOPIC - Abnormal; Notable for the following components:      Result Value   Hgb urine dipstick SMALL (*)    Leukocytes,Ua MODERATE (*)    WBC, UA >50 (*)    Bacteria, UA RARE (*)    All other  components within normal limits  URINE CULTURE  I-STAT BETA HCG BLOOD, ED ([facility], [facility], [facility] ONLY)  GC/CHLAMYDIA PROBE AMP ([facility]) NOT AT [facility]    EKG None  Radiology No results found.  Procedures Procedures (including critical care time)  Medications Ordered in ED Medications - No data to display   Initial Impression / Assessment and Plan / ED Course  I have reviewed the triage vital signs and the nursing notes.  Pertinent labs & imaging results that were available during my care of the patient were reviewed by me and considered in my medical decision making (see chart for details).       Patient presents with intermittent pelvic pain and mild urinary symptoms.  Discussed likely small UTI, reviewed urinalysis greater than 50 white blood cells, moderate leukocytes.  Culture sent, antibiotics for 5 days.  Pelvic exam very low concern for STD, GC culture sent.  Results and differential diagnosis were discussed with the patient/parent/guardian. Xrays were independently reviewed by myself.  Close follow up outpatient was discussed, comfortable with the plan.   Medications - No data to display  Vitals:   12/05/18 2213  BP: 114/80  Pulse: 77  Resp: 18  Temp: 98.8 F (37.1 C)  TempSrc: Oral  SpO2: 100%    Final diagnoses:  Urinary tract infection with hematuria, site unspecified  Pelvic pain     Final Clinical Impressions(s) / ED Diagnoses   Final diagnoses:  Urinary tract infection with hematuria, site unspecified  Pelvic pain    ED Discharge Orders         Ordered    cephALEXin (KEFLEX) 500 MG capsule  2 times daily     12/06/18 0056           Blane OharaZavitz, Arhianna Ebey, MD 12/06/18 [phone]

## 2018-12-17 ENCOUNTER — Emergency Department (HOSPITAL_COMMUNITY)
Admission: EM | Admit: 2018-12-17 | Discharge: 2018-12-17 | Disposition: A | Discharge status: Home / Self Care | Payer: Medicaid Other | Attending: Emergency Medicine | Admitting: Emergency Medicine

## 2018-12-17 ENCOUNTER — Encounter (HOSPITAL_COMMUNITY): Payer: Self-pay | Admitting: Emergency Medicine

## 2018-12-17 ENCOUNTER — Other Ambulatory Visit: Payer: Self-pay

## 2018-12-17 DIAGNOSIS — R519 Headache, unspecified: Secondary | ICD-10-CM

## 2018-12-17 DIAGNOSIS — R51 Headache: Secondary | ICD-10-CM | POA: Diagnosis not present

## 2018-12-17 HISTORY — DX: Migraine, unspecified, not intractable, without status migrainosus: G43.909

## 2018-12-17 LAB — POC URINE PREG, ED: Preg Test, Ur: NEGATIVE

## 2018-12-17 MED ORDER — METHYLPREDNISOLONE SODIUM SUCC 125 MG IJ SOLR
125.0000 mg | Freq: Once | INTRAMUSCULAR | Status: AC
  Administered 2018-12-17: 125 mg via INTRAVENOUS
  Filled 2018-12-17: qty 2

## 2018-12-17 MED ORDER — DIPHENHYDRAMINE HCL 50 MG/ML IJ SOLN
25.0000 mg | Freq: Once | INTRAMUSCULAR | Status: AC
  Administered 2018-12-17: 25 mg via INTRAVENOUS
  Filled 2018-12-17: qty 1

## 2018-12-17 MED ORDER — LACTATED RINGERS IV BOLUS
1000.0000 mL | Freq: Once | INTRAVENOUS | Status: AC
  Administered 2018-12-17: 1000 mL via INTRAVENOUS

## 2018-12-17 MED ORDER — KETOROLAC TROMETHAMINE 30 MG/ML IJ SOLN
30.0000 mg | Freq: Once | INTRAMUSCULAR | Status: AC
  Administered 2018-12-17: 30 mg via INTRAVENOUS
  Filled 2018-12-17: qty 1

## 2018-12-17 MED ORDER — METOCLOPRAMIDE HCL 5 MG/ML IJ SOLN
10.0000 mg | Freq: Once | INTRAMUSCULAR | Status: AC
  Administered 2018-12-17: 10 mg via INTRAVENOUS
  Filled 2018-12-17: qty 2

## 2018-12-17 NOTE — ED Provider Notes (Signed)
Emergency Department Provider Note   I have reviewed the triage vital signs and the nursing notes.   HISTORY  Chief Complaint Headache   HPI Brandy [name] is a 23 y.o. female With a history of heart murmur migraines but none recently presents emerged from today with repeat migraine.  Patient states she has pain behind her thigh consistent with previous migraines for the last couple days.  Patient states is been progressively worsening.  She will take some ibuprofen which relieves a little bit but does not last long.  She states that she is been staying in a dark room and trying to sleep as much as possible but tonight around midnight to continue to get worse and she presents here for further evaluation.  She has no paresthesias, weakness, numbness, vision changes, fever, neck pain or other associated symptoms.  No other associated or modifying symptoms.    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 All pertinent positives and negatives as reviewed in the HPI. ____________________________________________   PHYSICAL EXAM:  VITAL SIGNS: ED Triage Vitals    Enc Vitals Group     BP 12/17/18 0035 106/70     Pulse Rate 12/17/18 0035 65     Resp 12/17/18 0035 16     Temp 12/17/18 0035 98.6 F (37 C)     Temp Source 12/17/18 0035 Oral     SpO2 12/17/18 0035 98 %     Weight --      Height --      Head Circumference --      Peak Flow --      Pain Score 12/17/18 0042 10     Pain Loc --      Pain Edu? --      Excl. in GC? --     Constitutional: Alert and oriented. Well appearing and in no acute distress. Eyes: Conjunctivae are normal. PERRL. EOMI. Head: Atraumatic. Nose: No congestion/rhinnorhea. Mouth/Throat: Mucous membranes are moist.  Oropharynx non-erythematous. Neck: No stridor.  No meningeal signs.   Cardiovascular: Normal rate, regular rhythm. Good peripheral circulation. Grossly normal heart sounds.   Respiratory: Normal respiratory effort.  No retractions. Lungs CTAB. Gastrointestinal: Soft and nontender. No distention.  Musculoskeletal: No lower extremity tenderness nor edema. No gross deformities of extremities. Neurologic:  Normal speech and language. No gross focal neurologic deficits are appreciated.  Patient has normal grip strength, but flexion extension of her elbows, normal eyebrow raise, no tongue deviation, normal extraocular movements without any evidence of pupillary dilation or constriction.  Able to lift  both legs off the bed and given raise without difficulty.  Normal dorsiflexion plantarflexion strength. Skin:  Skin is warm, dry and intact. No rash noted.  ____________________________________________   LABS (all labs ordered are listed, but only abnormal results are displayed)  Labs Reviewed  POC URINE PREG, ED   ____________________________________________  INITIAL IMPRESSION / ASSESSMENT AND PLAN / ED COURSE  Symptoms seem consistent with her previous migraines will treat for the same.  Low suspicion for meningitis, head bleed, traumatic bleed, other emergent causes for headache at this time.  Headache  improved.  Neurologic exam stable.  No indication for imaging or further work-up at this time.  We will continue treatment at home and follow-up with neurology.  Pertinent labs & imaging results that were available during my care of the patient were reviewed by me and considered in my medical decision making (see chart for details).  A medical screening exam was performed and I feel the patient has had an appropriate workup for their chief complaint at this time and likelihood of emergent condition existing is low. They have been counseled on decision, discharge, follow up and which symptoms necessitate immediate return to the emergency department. They or their family verbally stated understanding and agreement with plan and discharged in stable condition.   ____________________________________________  FINAL CLINICAL IMPRESSION(S) / ED DIAGNOSES  Final diagnoses:  Nonintractable headache, unspecified chronicity pattern, unspecified headache type     MEDICATIONS GIVEN DURING THIS VISIT:  Medications  metoCLOPramide (REGLAN) injection 10 mg (10 mg Intravenous Given 12/17/18 0241)  ketorolac (TORADOL) 30 MG/ML injection 30 mg (30 mg Intravenous Given 12/17/18 0243)  diphenhydrAMINE (BENADRYL) injection 25 mg (25 mg Intravenous Given 12/17/18 0242)  methylPREDNISolone sodium succinate (SOLU-MEDROL) 125 mg/2 mL injection 125 mg (125 mg Intravenous Given 12/17/18 0244)  lactated ringers bolus 1,000 mL (0 mLs Intravenous Stopped 12/17/18 0415)     NEW OUTPATIENT MEDICATIONS STARTED DURING THIS VISIT:  Current Discharge Medication List      Note:  This note was prepared with assistance of Dragon voice recognition software. Occasional wrong-word or sound-a-like substitutions may have occurred due to the inherent limitations of voice recognition software.   Flois Mctague, Barbara Cower, MD 12/17/18 [phone]

## 2018-12-17 NOTE — ED Triage Notes (Signed)
C/o headache x 2 days.  Reports sharp pain behind L eye.  States she had history of migraines in the past but hasn't had one in a while.  Denies fever.  Reports nausea.

## 2018-12-19 ENCOUNTER — Telehealth: Payer: Self-pay | Admitting: Obstetrics and Gynecology

## 2018-12-19 NOTE — Telephone Encounter (Signed)
Called the patient to inform of upcoming appointment. Received a message stating your call can not be completed at this time. Please hang up and try your call again later. °

## 2018-12-20 ENCOUNTER — Telehealth: Payer: Self-pay | Admitting: Student

## 2018-12-20 ENCOUNTER — Other Ambulatory Visit: Payer: Self-pay

## 2018-12-20 ENCOUNTER — Ambulatory Visit (INDEPENDENT_AMBULATORY_CARE_PROVIDER_SITE_OTHER): Payer: Medicaid Other | Admitting: Student

## 2018-12-20 DIAGNOSIS — N920 Excessive and frequent menstruation with regular cycle: Secondary | ICD-10-CM | POA: Diagnosis not present

## 2018-12-20 DIAGNOSIS — Z30431 Encounter for routine checking of intrauterine contraceptive device: Secondary | ICD-10-CM

## 2018-12-20 DIAGNOSIS — Z975 Presence of (intrauterine) contraceptive device: Secondary | ICD-10-CM | POA: Diagnosis not present

## 2018-12-20 DIAGNOSIS — G43909 Migraine, unspecified, not intractable, without status migrainosus: Secondary | ICD-10-CM | POA: Diagnosis not present

## 2018-12-20 NOTE — Telephone Encounter (Signed)
Called the patient to inform of upcoming visit, the patient verbalized understanding.

## 2018-12-20 NOTE — Progress Notes (Signed)
I connected with  Brandy [name] on 12/20/18 at  3:55 PM EDT by telephone and verified that I am speaking with the correct person using two identifiers.   I discussed the limitations, risks, security and privacy concerns of performing an evaluation and management service by telephone and the availability of in person appointments. I also discussed with the patient that there may be a patient responsible charge related to this service. The patient expressed understanding and agreed to proceed.  Osvaldo Human, RN 12/20/2018  4:14 PM   Pt states she has been having severe migraines since she got her IUD, heavy vaginal bleeding when she is on her period, and lower abdominal cramping that is intermittent. Wants to discuss changing birth controls.

## 2018-12-21 DIAGNOSIS — Z30431 Encounter for routine checking of intrauterine contraceptive device: Secondary | ICD-10-CM | POA: Insufficient documentation

## 2018-12-21 NOTE — Progress Notes (Signed)
Patient ID: Brandy [name], female   DOB: 12-24-1995, 23 y.o.   MRN: [id number]   TELEHEALTH VIRTUAL GYNECOLOGY VISIT ENCOUNTER NOTE  I connected with Brandy [name] on 12/21/18 at  3:55 PM EDT by telephone at home and verified that I am speaking with the correct person using two identifiers.   I discussed the limitations, risks, security and privacy concerns of performing an evaluation and management service by telephone and the availability of in person appointments. I also discussed with the patient that there may be a patient responsible charge related to this service. The patient expressed understanding and agreed to proceed.  After multiple attempts, we were not able to connect via Webex (software was frozen).    History:  Brandy [name] is a 23 y.o. [phone] female being evaluated today for migraines, heavy menses and intermittent abdominal pain. She denies any abnormal vaginal discharge, pelvic pain or other concerns.  This has been going on since 2018, and she would like to switch methods.      Past Medical History:  Diagnosis Date  . Chlamydia   . Heart murmur   . Migraine    Past Surgical History:  Procedure Laterality Date  . NO PAST SURGERIES     The following portions of the patient's history were reviewed and updated as appropriate: allergies, current medications, past family history, past medical history, past social history, past surgical history and problem list.   Health Maintenance:  Normal pap and negative HRHPV on 2016.  Review of Systems:  Pertinent items noted in HPI and remainder of comprehensive ROS otherwise negative.  Physical Exam:   General:  Alert, oriented and cooperative.   Mental Status: Normal mood and affect perceived. Normal judgment and thought content.  Physical exam deferred due to nature of the encounter  Labs and Imaging Results for orders placed or performed during the hospital encounter of 12/17/18 (from the past 336 hour(s))  POC  urine preg, ED (not at [facility])   Collection Time: 12/17/18  4:21 AM  Result Value Ref Range   Preg Test, Ur NEGATIVE NEGATIVE   No results found.    Assessment and Plan:      1. IUD check up    2. I counseled patient that her migraines, bleeding and cramping may be able to be managed with different medication regimen but patient would like IUD removed. Will message the front desk to schedule her for an in-person visit.         I discussed the assessment and treatment plan with the patient. The patient was provided an opportunity to ask questions and all were answered. The patient agreed with the plan and demonstrated an understanding of the instructions.   The patient was advised to call back or seek an in-person evaluation/go to the ED if the symptoms worsen or if the condition fails to improve as anticipated.  I provided 10 minutes of non-face-to-face time during this encounter.   Marylene Land, CNM [facility] for [employer], [facility]

## 2019-01-03 ENCOUNTER — Ambulatory Visit: Payer: Medicaid Other | Admitting: Student
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
## 2019-05-01 ENCOUNTER — Telehealth: Payer: Self-pay | Admitting: Student

## 2019-05-01 NOTE — Telephone Encounter (Signed)
Spoke with patient about her appointment. She was coming in to get her IUD removed. However, she stated it fell out and her bleeding was like a discharge now. She was bleeding bad and passing clots, but not anymore. She wants to get on birthcontrol. Spoke with Lawernce Keas, NP and she said to make it a MyChart visit.

## 2019-05-02 ENCOUNTER — Other Ambulatory Visit: Payer: Self-pay

## 2019-05-02 ENCOUNTER — Telehealth (INDEPENDENT_AMBULATORY_CARE_PROVIDER_SITE_OTHER): Payer: Medicaid Other | Admitting: Student

## 2019-05-02 DIAGNOSIS — Z30011 Encounter for initial prescription of contraceptive pills: Secondary | ICD-10-CM | POA: Diagnosis not present

## 2019-05-02 MED ORDER — NORGESTIMATE-ETH ESTRADIOL 0.25-35 MG-MCG PO TABS: 1.0000 | ORAL_TABLET | Freq: Every day | ORAL | 11 refills | Status: DC

## 2019-05-02 NOTE — Progress Notes (Signed)
TELEHEALTH VIRTUAL GYNECOLOGY VISIT ENCOUNTER NOTE  I connected with Brandy [name] on 05/02/19 at  3:35 PM EDT by telephone at home and verified that I am speaking with the correct person using two identifiers.   I discussed the limitations, risks, security and privacy concerns of performing an evaluation and management service by telephone and the availability of in person appointments. I also discussed with the patient that there may be a patient responsible charge related to this service. The patient expressed understanding and agreed to proceed.   History:  Brandy [name] is a 23 y.o. [phone] female being evaluated today for IUD falling out. She reports it fell out in the shower. She denies any abnormal vaginal discharge, bleeding, pelvic pain or other concerns.       Past Medical History:  Diagnosis Date  . Chlamydia   . Heart murmur   . Migraine    Past Surgical History:  Procedure Laterality Date  . NO PAST SURGERIES     The following portions of the patient's history were reviewed and updated as appropriate: allergies, current medications, past family history, past medical history, past social history, past surgical history and problem list.   Health Maintenance:  Normal pap and negative HRHPV in 2016.  Normal mammogram on NA.   Review of Systems:  Pertinent items noted in HPI and remainder of comprehensive ROS otherwise negative.  Physical Exam:   General:  Alert, oriented and cooperative.   Mental Status: Normal mood and affect perceived. Normal judgment and thought content.  Physical exam deferred due to nature of the encounter  Labs and Imaging No results found for this or any previous visit (from the past 336 hour(s)). No results found.    Assessment and Plan:      1. Encounter for initial prescription of contraceptive pills   -patient wants to start [place], will order.  -patient needs Pap smear; will schedule.        I discussed the assessment and  treatment plan with the patient. The patient was provided an opportunity to ask questions and all were answered. The patient agreed with the plan and demonstrated an understanding of the instructions.   The patient was advised to call back or seek an in-person evaluation/go to the ED if the symptoms worsen or if the condition fails to improve as anticipated.  I provided 10 minutes of non-face-to-face time during this encounter.   [place], [place] for [employer], [place] [facility]   Telephone Codes: . [phone]- Telephone evaluation and management service by a physician or other qualified health care professional who may report evaluation and management services provided to an established patient, parent, or guardian not originating from a related E/M service provided within the previous 7 days nor leading to an E/M service or procedure within the next 24 hours or soonest available appointment; 5-10 minutes of medical discussion  . 01601 - 11-20 minutes of medical discussion  . 09323 - 21-30 minutes of medical discussion  . Note: Medicare and Medicaid only G2012- Brief communication technology-based service, e.g., virtual check-in, by a physician or other qualified health care professional who can report evaluation and management services, provided to an established patient, not originating from a related E/M service provided within the previous 7 days nor leading to an E/M service or procedure within the next 24 hours or soonest available appointment; 5-10 minutes WebEx Codes:   . [phone]  . [phone]  Behavioral Medicine: . [id number]  OB  In-person WebEx Phone  New OB 0501F Add CR modifier Always in-person  ROB 0502F Add CR modifier 99441 - 5-10 mins  (Need to be WebEx to be part of package) 99442 - 11-20 mins     99443 - 21-30 mins  PP 0503F Add CR modifier 99441 - 5-10 mins     99442 - 11-20 mins     99443 - 21-30 mins   S0280 New OB PMH Form     S0281 PP Visit (Virtual/Person)             GYN In-person WebEx Phone  Est Level 1 (RN) [phone] Add CR modifer N/A  Est Level 2 (10 min) 24580 Add CR modifer 99441 - 5-10 mins  Est Level 3 (15 min) 99833 Add CR modifer 99442 - 11-20 mins  Est Level 4 (25 min) 82505 Add CR modifer 99443 - 21-30 mins  Est Level 5 (40 min) 39767 Add CR modifer N/A       NP Level 1 (10 min) 34193    NP Level 2 (20 min) 79024    NP Level 3 (30 min) 09735    NP Level 4 (45 min) 32992    NP Level 5 (60 min) 42683

## 2019-05-08 ENCOUNTER — Other Ambulatory Visit: Payer: Self-pay

## 2019-05-08 ENCOUNTER — Ambulatory Visit (HOSPITAL_COMMUNITY)
Admission: EM | Admit: 2019-05-08 | Discharge: 2019-05-08 | Disposition: A | Discharge status: Home / Self Care | Payer: Medicaid Other | Attending: Emergency Medicine | Admitting: Emergency Medicine

## 2019-05-08 ENCOUNTER — Ambulatory Visit (INDEPENDENT_AMBULATORY_CARE_PROVIDER_SITE_OTHER): Payer: Medicaid Other

## 2019-05-08 ENCOUNTER — Encounter (HOSPITAL_COMMUNITY): Payer: Self-pay | Admitting: Emergency Medicine

## 2019-05-08 DIAGNOSIS — S40021A Contusion of right upper arm, initial encounter: Secondary | ICD-10-CM

## 2019-05-08 MED ORDER — IBUPROFEN 800 MG PO TABS: 800.0000 mg | ORAL_TABLET | Freq: Three times a day (TID) | ORAL | 0 refills | Status: DC | PRN

## 2019-05-08 NOTE — ED Provider Notes (Signed)
[facility]    CSN: [id number] Arrival date & time: 05/08/19  1658      History   Chief Complaint Chief Complaint  Patient presents with  . Arm Pain    HPI Brandy [name] is a 23 y.o. female.   Patient presents with right forearm pain after falling earlier today.  She states she was getting out of the shower when she slipped and fell.  She denies numbness, paresthesias, weakness.  She denies head injury or LOC.  LMP: 05/01/2019.  The history is provided by the patient.    Past Medical History:  Diagnosis Date  . Chlamydia   . Heart murmur   . Migraine     Patient Active Problem List   Diagnosis Date Noted  . Encounter for initial prescription of contraceptive pills 05/02/2019  . IUD check up 12/21/2018  . Normal labor 11/22/2016  . 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 NO PAST SURGERIES      OB History    Gravida  3   Para  3   Term  1   Preterm  2   AB      Living  3     SAB      TAB      Ectopic      Multiple  0   Live Births  3            Home Medications    Prior to Admission medications   Medication Sig Start Date End Date Taking? Authorizing Provider  ibuprofen (ADVIL) 800 MG tablet Take 1 tablet (800 mg total) by mouth every 8 (eight) hours as needed. 05/08/19   Mickie Bail, NP  norgestimate-ethinyl estradiol (ORTHO-CYCLEN) 0.25-35 MG-MCG tablet Take 1 tablet by mouth daily. 05/02/19   Marylene Land, CNM    Family History Family History  Problem Relation Age of Onset  . Asthma Mother     Social History Social History   Tobacco Use  . Smoking status: Never Smoker  . Smokeless tobacco: Never Used  Substance Use Topics  . Alcohol use: No  . Drug use: No     Allergies   Kiwi extract, Penicillins, and Lobster [shellfish allergy]   Review of Systems Review of Systems   Constitutional: Negative for chills and fever.  HENT: Negative for ear pain and sore throat.   Eyes: Negative for pain and visual disturbance.  Respiratory: Negative for cough and shortness of breath.   Cardiovascular: Negative for chest pain and palpitations.  Gastrointestinal: Negative for abdominal pain and vomiting.  Genitourinary: Negative for dysuria and hematuria.  Musculoskeletal: Negative for back pain.  Skin: Negative for color change and rash.  Neurological: Negative for seizures and syncope.  All other systems reviewed and are negative.    Physical Exam Triage Vital Signs ED Triage Vitals [05/08/19 1714]  Enc Vitals Group     BP 118/68     Pulse Rate 64     Resp 18     Temp 98.4 F (36.9 C)     Temp Source Oral     SpO2 100 %     Weight      Height      Head Circumference      Peak Flow      Pain Score 8     Pain Loc  Pain Edu?      Excl. in [place]?    No data found.  Updated Vital Signs BP 118/68 (BP Location: Left Arm)   Pulse 64   Temp 98.4 F (36.9 C) (Oral)   Resp 18   SpO2 100%   Visual Acuity Right Eye Distance:   Left Eye Distance:   Bilateral Distance:    Right Eye Near:   Left Eye Near:    Bilateral Near:     Physical Exam Vitals signs and nursing note reviewed.  Constitutional:      General: She is not in acute distress.    Appearance: She is well-developed.  HENT:     Head: Normocephalic and atraumatic.  Eyes:     Conjunctiva/sclera: Conjunctivae normal.  Neck:     Musculoskeletal: Neck supple.  Cardiovascular:     Rate and Rhythm: Normal rate and regular rhythm.     Heart sounds: No murmur.  Pulmonary:     Effort: Pulmonary effort is normal. No respiratory distress.     Breath sounds: Normal breath sounds.  Abdominal:     Palpations: Abdomen is soft.     Tenderness: There is no abdominal tenderness. There is no guarding or rebound.  Musculoskeletal: Normal range of motion.        General: Tenderness present. No  swelling or deformity.     Comments: Tender to palpation of right mid forearm.    Skin:    General: Skin is warm and dry.     Capillary Refill: Capillary refill takes less than 2 seconds.     Findings: No bruising, erythema, lesion or rash.  Neurological:     General: No focal deficit present.     Mental Status: She is alert and oriented to person, place, and time.     Sensory: No sensory deficit.     Motor: No weakness.      UC Treatments / Results  Labs (all labs ordered are listed, but only abnormal results are displayed) Labs Reviewed - No data to display  EKG   Radiology Dg Forearm Right  Result Date: 05/08/2019 CLINICAL DATA:  Fall this morning, RIGHT arm pain. EXAM: RIGHT FOREARM - 2 VIEW COMPARISON:  None. FINDINGS: There is no evidence of fracture or other focal bone lesions. Soft tissues are unremarkable. IMPRESSION: Negative. Electronically Signed   By: Franki Cabot M.D.   On: 05/08/2019 17:47    Procedures Procedures (including critical care time)  Medications Ordered in UC Medications - No data to display  Initial Impression / Assessment and Plan / UC Course  I have reviewed the triage vital signs and the nursing notes.  Pertinent labs & imaging results that were available during my care of the patient were reviewed by me and considered in my medical decision making (see chart for details).    Right forearm contusion.  Treating with ibuprofen, RICE.  Instructed patient to follow-up with her PCP or an orthopedist if her pain continues or worsens or if she develops new symptoms such as weakness, paresthesias, numbness.  Patient agrees to plan of care.     Final Clinical Impressions(s) / UC Diagnoses   Final diagnoses:  Arm contusion, right, initial encounter     Discharge Instructions     Take the ibuprofen as prescribed.  Rest and elevate your arm.  Apply ice packs 2-3 times a day for up to 20 minutes each.    Follow up with your primary care  provider or an orthopedist  if you symptoms continue or worsen;  Or if you develop new symptoms, such as numbness, tingling, or weakness.         ED Prescriptions    Medication Sig Dispense Auth. Provider   ibuprofen (ADVIL) 800 MG tablet Take 1 tablet (800 mg total) by mouth every 8 (eight) hours as needed. 21 tablet Mickie Bailate, Jiya Kissinger H, NP     PDMP not reviewed this encounter.   Mickie Bailate, Khloei Spiker H, NP 05/08/19 [phone]

## 2019-05-08 NOTE — ED Triage Notes (Signed)
Pt sts right forearm pain after trip and fall today

## 2019-05-08 NOTE — Discharge Instructions (Signed)
Take the ibuprofen as prescribed.  Rest and elevate your arm.  Apply ice packs 2-3 times a day for up to 20 minutes each.    Follow up with your primary care provider or an orthopedist if you symptoms continue or worsen;  Or if you develop new symptoms, such as numbness, tingling, or weakness.    

## 2019-05-18 ENCOUNTER — Encounter (HOSPITAL_COMMUNITY): Payer: Self-pay

## 2019-05-18 ENCOUNTER — Other Ambulatory Visit: Payer: Self-pay

## 2019-05-18 ENCOUNTER — Emergency Department (HOSPITAL_COMMUNITY)
Admission: EM | Admit: 2019-05-18 | Discharge: 2019-05-18 | Disposition: A | Discharge status: Home / Self Care | Payer: Medicaid Other | Attending: Emergency Medicine | Admitting: Emergency Medicine

## 2019-05-18 DIAGNOSIS — R109 Unspecified abdominal pain: Secondary | ICD-10-CM | POA: Diagnosis present

## 2019-05-18 DIAGNOSIS — R110 Nausea: Secondary | ICD-10-CM | POA: Insufficient documentation

## 2019-05-18 DIAGNOSIS — A599 Trichomoniasis, unspecified: Secondary | ICD-10-CM

## 2019-05-18 DIAGNOSIS — A5901 Trichomonal vulvovaginitis: Secondary | ICD-10-CM | POA: Insufficient documentation

## 2019-05-18 DIAGNOSIS — Z79899 Other long term (current) drug therapy: Secondary | ICD-10-CM | POA: Diagnosis not present

## 2019-05-18 LAB — URINALYSIS, ROUTINE W REFLEX MICROSCOPIC
Bilirubin Urine: NEGATIVE
Glucose, UA: NEGATIVE mg/dL
Hgb urine dipstick: NEGATIVE
Ketones, ur: NEGATIVE mg/dL
Leukocytes,Ua: NEGATIVE
Nitrite: NEGATIVE
Protein, ur: NEGATIVE mg/dL
Specific Gravity, Urine: 1.012 (ref 1.005–1.030)
pH: 7 (ref 5.0–8.0)

## 2019-05-18 LAB — CBC
HCT: 37.5 % (ref 36.0–46.0)
Hemoglobin: 12.2 g/dL (ref 12.0–15.0)
MCH: 33.7 pg (ref 26.0–34.0)
MCHC: 32.5 g/dL (ref 30.0–36.0)
MCV: 103.6 fL — ABNORMAL HIGH (ref 80.0–100.0)
Platelets: 258 10*3/uL (ref 150–400)
RBC: 3.62 MIL/uL — ABNORMAL LOW (ref 3.87–5.11)
RDW: 11.8 % (ref 11.5–15.5)
WBC: 3.5 10*3/uL — ABNORMAL LOW (ref 4.0–10.5)
nRBC: 0 % (ref 0.0–0.2)

## 2019-05-18 LAB — COMPREHENSIVE METABOLIC PANEL
ALT: 21 U/L (ref 0–44)
AST: 28 U/L (ref 15–41)
Albumin: 4 g/dL (ref 3.5–5.0)
Alkaline Phosphatase: 50 U/L (ref 38–126)
Anion gap: 8 (ref 5–15)
BUN: 12 mg/dL (ref 6–20)
CO2: 24 mmol/L (ref 22–32)
Calcium: 9.4 mg/dL (ref 8.9–10.3)
Chloride: 108 mmol/L (ref 98–111)
Creatinine, Ser: 0.81 mg/dL (ref 0.44–1.00)
GFR calc Af Amer: >60 mL/min (ref >60)
GFR calc non Af Amer: >60 mL/min (ref >60)
Glucose, Bld: 104 mg/dL — ABNORMAL HIGH (ref 70–99)
Potassium: 4 mmol/L (ref 3.5–5.1)
Sodium: 140 mmol/L (ref 135–145)
Total Bilirubin: 0.5 mg/dL (ref 0.3–1.2)
Total Protein: 7 g/dL (ref 6.5–8.1)

## 2019-05-18 LAB — I-STAT BETA HCG BLOOD, ED (MC, WL, AP ONLY): I-stat hCG, quantitative: <5 m[IU]/mL (ref <5)

## 2019-05-18 LAB — WET PREP, GENITAL
Clue Cells Wet Prep HPF POC: NONE SEEN
Sperm: NONE SEEN
Yeast Wet Prep HPF POC: NONE SEEN

## 2019-05-18 LAB — LIPASE, BLOOD: Lipase: 32 U/L (ref 11–51)

## 2019-05-18 MED ORDER — ONDANSETRON 4 MG PO TBDP
4.0000 mg | ORAL_TABLET | Freq: Once | ORAL | Status: AC
  Administered 2019-05-18: 4 mg via ORAL
  Filled 2019-05-18: qty 1

## 2019-05-18 MED ORDER — METRONIDAZOLE 500 MG PO TABS
2000.0000 mg | ORAL_TABLET | Freq: Once | ORAL | Status: AC
  Administered 2019-05-18: 08:00:00 2000 mg via ORAL
  Filled 2019-05-18: qty 4

## 2019-05-18 MED ORDER — CEFTRIAXONE SODIUM 250 MG IJ SOLR
250.0000 mg | Freq: Once | INTRAMUSCULAR | Status: AC
  Administered 2019-05-18: 08:00:00 250 mg via INTRAMUSCULAR
  Filled 2019-05-18: qty 250

## 2019-05-18 MED ORDER — LIDOCAINE HCL (PF) 1 % IJ SOLN
INTRAMUSCULAR | Status: AC
  Filled 2019-05-18: qty 5

## 2019-05-18 MED ORDER — SODIUM CHLORIDE 0.9% FLUSH: 3.0000 mL | Freq: Once | INTRAVENOUS | Status: DC

## 2019-05-18 MED ORDER — AZITHROMYCIN 250 MG PO TABS
1000.0000 mg | ORAL_TABLET | Freq: Once | ORAL | Status: AC
  Administered 2019-05-18: 08:00:00 1000 mg via ORAL
  Filled 2019-05-18: qty 4

## 2019-05-18 NOTE — ED Provider Notes (Addendum)
[facility] EMERGENCY DEPARTMENT Provider Note   CSN: [id number] Arrival date & time: 05/18/19  0018     History   Chief Complaint Chief Complaint  Patient presents with  . Abdominal Pain  . Nausea    HPI Brandy [name] is a 23 y.o. female.     The history is provided by the patient and medical records. No language interpreter was used.  Abdominal Pain Associated symptoms: nausea   Associated symptoms: no diarrhea and no vomiting    Brandy [name] is a 23 y.o. female  with a PMH as listed below who presents to the Emergency Department complaining of lower abdominal cramping intermittently as well as nausea for the last 2 to 3 days.  She states that 3 weeks ago, she was in the shower and was checking her IUD and it was out of place and loose, therefore she took it out herself.  She states that over the next 2 weeks, she had some bleeding which is now resolved.  She denies any pelvic pain or further bleeding at this time.  She did have unprotected intercourse and concerned that since her IUD is now out that she may be pregnant given her nausea.  Denies any diarrhea or constipation.  No fever. No vaginal discharge or urinary symptoms. Currently not having any abdominal pain, but does report intermittent cramping similar to the pain she is experienced with her menstrual cycles in the past. No medications taken prior to arrival.   Past Medical History:  Diagnosis Date  . Chlamydia   . Heart murmur   . Migraine     Patient Active Problem List   Diagnosis Date Noted  . Encounter for initial prescription of contraceptive pills 05/02/2019  . IUD check up 12/21/2018  . Normal labor 11/22/2016  . 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 NO PAST SURGERIES       OB History    Gravida  3   Para  3   Term  1   Preterm  2   AB      Living  3     SAB      TAB      Ectopic      Multiple  0   Live Births  3            Home Medications    Prior to Admission medications   Medication Sig Start Date End Date Taking? Authorizing Provider  ibuprofen (ADVIL) 800 MG tablet Take 1 tablet (800 mg total) by mouth every 8 (eight) hours as needed. 05/08/19   Mickie Bail, NP  norgestimate-ethinyl estradiol (ORTHO-CYCLEN) 0.25-35 MG-MCG tablet Take 1 tablet by mouth daily. 05/02/19   Marylene Land, CNM    Family History Family History  Problem Relation Age of Onset  . Asthma Mother     Social History Social History   Tobacco Use  . Smoking status: Never Smoker  . Smokeless tobacco: Never Used  Substance Use Topics  . Alcohol use: No  . Drug use: No     Allergies   Kiwi extract, Penicillins, and Lobster [shellfish allergy]   Review of Systems Review of Systems  Gastrointestinal: Positive for abdominal pain and nausea. Negative for diarrhea and vomiting.  All other systems reviewed and are negative.    Physical Exam Updated Vital Signs BP 130/80  Pulse 61   Temp 98.3 F (36.8 C) (Oral)   Resp 15   SpO2 100%   Physical Exam Vitals signs and nursing note reviewed.  Constitutional:      General: She is not in acute distress.    Appearance: She is well-developed.     Comments: Well-appearing.  HENT:     Head: Normocephalic and atraumatic.  Neck:     Musculoskeletal: Neck supple.  Cardiovascular:     Rate and Rhythm: Normal rate and regular rhythm.     Heart sounds: Normal heart sounds. No murmur.  Pulmonary:     Effort: Pulmonary effort is normal. No respiratory distress.     Breath sounds: Normal breath sounds.  Abdominal:     General: There is no distension.     Palpations: Abdomen is soft.     Comments: No abdominal tenderness.  Genitourinary:    Comments: Chaperone present for exam. Small amount of white discharge. No CMT. No adnexal masses, tenderness, or  fullness.  No bleeding within vaginal vault. Skin:    General: Skin is warm and dry.  Neurological:     Mental Status: She is alert and oriented to person, place, and time.      ED Treatments / Results  Labs (all labs ordered are listed, but only abnormal results are displayed) Labs Reviewed  WET PREP, GENITAL - Abnormal; Notable for the following components:      Result Value   Trich, Wet Prep PRESENT (*)    WBC, Wet Prep HPF POC MANY (*)    All other components within normal limits  COMPREHENSIVE METABOLIC PANEL - Abnormal; Notable for the following components:   Glucose, Bld 104 (*)    All other components within normal limits  CBC - Abnormal; Notable for the following components:   WBC 3.5 (*)    RBC 3.62 (*)    MCV 103.6 (*)    All other components within normal limits  URINALYSIS, ROUTINE W REFLEX MICROSCOPIC - Abnormal; Notable for the following components:   Color, Urine STRAW (*)    All other components within normal limits  LIPASE, BLOOD  I-STAT BETA HCG BLOOD, ED ([facility], [facility], [facility] ONLY)  GC/CHLAMYDIA PROBE AMP ([place]) NOT AT [facility]    EKG None  Radiology No results found.  Procedures Procedures (including critical care time)  Medications Ordered in ED Medications  sodium chloride flush (NS) 0.9 % injection 3 mL (has no administration in time range)  metroNIDAZOLE (FLAGYL) tablet 2,000 mg (has no administration in time range)  ondansetron (ZOFRAN-ODT) disintegrating tablet 4 mg (4 mg Oral Given 05/18/19 0731)     Initial Impression / Assessment and Plan / ED Course  I have reviewed the triage vital signs and the nursing notes.  Pertinent labs & imaging results that were available during my care of the patient were reviewed by me and considered in my medical decision making (see chart for details).       Brandy [name] is a 23 y.o. female who presents to ED for intermittent lower abdominal cramping and nausea for the last 3 days.  Currently nauseous,  but pain-free.  On exam, patient is afebrile, hemodynamically stable with no abdominal tenderness.  GU exam without cervical motion or adnexal tenderness.  Pregnancy negative.  Remainder of labs reviewed and reassuring.  Urinalysis without signs of infection.  Wet prep + for trich - flagyl given.  Discussed abstinence for 2 weeks as well as treatment of partners.  G&C  obtained and patient aware that she will be notified if results are positive.  She would like prophylactic treatment for such which have been given.  Encouraged OBGYN follow up. Return precautions discussed and all questions answered.   Final Clinical Impressions(s) / ED Diagnoses   Final diagnoses:  Trichimoniasis    ED Discharge Orders    None         Tyshika Baldridge, Chase PicketJaime Pilcher, PA-C 05/18/19 0756    Virgina Norfolkuratolo, Adam, DO 05/18/19 [id number]

## 2019-05-18 NOTE — Discharge Instructions (Signed)
Do not have sex for 2 weeks.  Please inform any sexual partners about your diagnosis of trichomonas as they will also need to be treated. Use a condom with every sexual encounter Follow up with your OBGYN in regards to today's visit.   Please return to the ER for fevers, vomiting, new or worsening symptoms, any additional concerns.   You have been tested for chlamydia and gonorrhea. These results will be available in approximately 3 days. You will be notified if they are positive.   It is very important to practice safe sex and use condoms when sexually active. If your results are positive you need to notify all sexual partners so they can be treated as well. The website https://dean.info/ can be used to send anonymous text messages or emails to alert sexual contacts.

## 2019-05-18 NOTE — ED Triage Notes (Signed)
Pt reports lower abd pain and nausea for 3 days. Also reports her IUD fell out 3 weeks ago and she had some vaginal bleeding for a few days. Pt denies vaginal bleeding at this time

## 2019-05-18 NOTE — ED Notes (Signed)
Pt verbalized understanding of discharge paperwork and follow-up care.  °

## 2019-05-25 LAB — GC/CHLAMYDIA PROBE AMP (~~LOC~~) NOT AT ARMC
Chlamydia: POSITIVE — AB
Neisseria Gonorrhea: NEGATIVE

## 2019-06-30 ENCOUNTER — Emergency Department (HOSPITAL_COMMUNITY)
Admission: EM | Admit: 2019-06-30 | Discharge: 2019-07-01 | Disposition: A | Discharge status: Home / Self Care | Payer: Medicaid Other | Attending: Emergency Medicine | Admitting: Emergency Medicine

## 2019-06-30 ENCOUNTER — Encounter (HOSPITAL_COMMUNITY): Payer: Self-pay

## 2019-06-30 ENCOUNTER — Other Ambulatory Visit: Payer: Self-pay

## 2019-06-30 DIAGNOSIS — R109 Unspecified abdominal pain: Secondary | ICD-10-CM | POA: Insufficient documentation

## 2019-06-30 DIAGNOSIS — Z5321 Procedure and treatment not carried out due to patient leaving prior to being seen by health care provider: Secondary | ICD-10-CM | POA: Insufficient documentation

## 2019-06-30 LAB — CBC
HCT: 37.2 % (ref 36.0–46.0)
Hemoglobin: 12.5 g/dL (ref 12.0–15.0)
MCH: 33.8 pg (ref 26.0–34.0)
MCHC: 33.6 g/dL (ref 30.0–36.0)
MCV: 100.5 fL — ABNORMAL HIGH (ref 80.0–100.0)
Platelets: 306 10*3/uL (ref 150–400)
RBC: 3.7 MIL/uL — ABNORMAL LOW (ref 3.87–5.11)
RDW: 11.7 % (ref 11.5–15.5)
WBC: 3.5 10*3/uL — ABNORMAL LOW (ref 4.0–10.5)
nRBC: 0 % (ref 0.0–0.2)

## 2019-06-30 LAB — COMPREHENSIVE METABOLIC PANEL
ALT: 26 U/L (ref 0–44)
AST: 39 U/L (ref 15–41)
Albumin: 4.3 g/dL (ref 3.5–5.0)
Alkaline Phosphatase: 66 U/L (ref 38–126)
Anion gap: 9 (ref 5–15)
BUN: 17 mg/dL (ref 6–20)
CO2: 25 mmol/L (ref 22–32)
Calcium: 9.5 mg/dL (ref 8.9–10.3)
Chloride: 104 mmol/L (ref 98–111)
Creatinine, Ser: 0.89 mg/dL (ref 0.44–1.00)
GFR calc Af Amer: >60 mL/min (ref >60)
GFR calc non Af Amer: >60 mL/min (ref >60)
Glucose, Bld: 86 mg/dL (ref 70–99)
Potassium: 3.7 mmol/L (ref 3.5–5.1)
Sodium: 138 mmol/L (ref 135–145)
Total Bilirubin: 0.6 mg/dL (ref 0.3–1.2)
Total Protein: 7.2 g/dL (ref 6.5–8.1)

## 2019-06-30 LAB — URINALYSIS, ROUTINE W REFLEX MICROSCOPIC
Bilirubin Urine: NEGATIVE
Glucose, UA: NEGATIVE mg/dL
Hgb urine dipstick: NEGATIVE
Ketones, ur: NEGATIVE mg/dL
Nitrite: NEGATIVE
Protein, ur: NEGATIVE mg/dL
Specific Gravity, Urine: 1.026 (ref 1.005–1.030)
pH: 5 (ref 5.0–8.0)

## 2019-06-30 LAB — I-STAT BETA HCG BLOOD, ED (MC, WL, AP ONLY): I-stat hCG, quantitative: <5 m[IU]/mL (ref <5)

## 2019-06-30 LAB — LIPASE, BLOOD: Lipase: 33 U/L (ref 11–51)

## 2019-06-30 MED ORDER — SODIUM CHLORIDE 0.9% FLUSH: 3.0000 mL | Freq: Once | INTRAVENOUS | Status: DC

## 2019-06-30 NOTE — ED Triage Notes (Signed)
Pt endorses generalized 10/10 abdominal pain and diarrhea x2 days. Pt denies NV. Pt states her daughter was seen recently with the same symptoms. Pt denies fever, cough, SOB, CP.

## 2019-07-01 NOTE — ED Notes (Signed)
Pt is not seen in waiting room and does not respond when called for room

## 2019-09-14 ENCOUNTER — Ambulatory Visit (HOSPITAL_COMMUNITY)
Admission: EM | Admit: 2019-09-14 | Discharge: 2019-09-14 | Disposition: A | Discharge status: Home / Self Care | Payer: Medicaid Other | Attending: Internal Medicine | Admitting: Internal Medicine

## 2019-09-14 ENCOUNTER — Encounter (HOSPITAL_COMMUNITY): Payer: Self-pay

## 2019-09-14 ENCOUNTER — Other Ambulatory Visit: Payer: Self-pay

## 2019-09-14 DIAGNOSIS — N946 Dysmenorrhea, unspecified: Secondary | ICD-10-CM

## 2019-09-14 DIAGNOSIS — Z3202 Encounter for pregnancy test, result negative: Secondary | ICD-10-CM

## 2019-09-14 LAB — POCT URINALYSIS DIP (DEVICE)
Bilirubin Urine: NEGATIVE
Glucose, UA: NEGATIVE mg/dL
Ketones, ur: NEGATIVE mg/dL
Leukocytes,Ua: NEGATIVE
Nitrite: NEGATIVE
Protein, ur: 30 mg/dL — AB
Specific Gravity, Urine: 1.02 (ref 1.005–1.030)
Urobilinogen, UA: 4 mg/dL — ABNORMAL HIGH (ref 0.0–1.0)
pH: 8.5 — ABNORMAL HIGH (ref 5.0–8.0)

## 2019-09-14 MED ORDER — KETOROLAC TROMETHAMINE 30 MG/ML IJ SOLN
INTRAMUSCULAR | Status: AC
  Filled 2019-09-14: qty 1

## 2019-09-14 MED ORDER — IBUPROFEN 600 MG PO TABS: 600.0000 mg | ORAL_TABLET | Freq: Three times a day (TID) | ORAL | 0 refills | Status: DC | PRN

## 2019-09-14 MED ORDER — KETOROLAC TROMETHAMINE 30 MG/ML IJ SOLN
30.0000 mg | Freq: Once | INTRAMUSCULAR | Status: AC
  Administered 2019-09-14: 30 mg via INTRAMUSCULAR

## 2019-09-14 NOTE — ED Provider Notes (Signed)
[place]    CSN: [id number] Arrival date & time: 09/14/19  1659      History   Chief Complaint Chief Complaint  Patient presents with  . Vaginal Bleeding    HPI Brandy [name] is a 24 y.o. female with no past medical history comes to urgent care with vaginal bleeding and lower abdominal cramping which started today.  Patient initially started having some mild cramping last night and when she woke up this morning she realized that she has had vaginal bleeding.  She took a home pregnancy test twice and one test was positive and the other test was negative.  She had associated nausea and vomiting.  She came to the urgent care for further evaluation.  She has no dizziness.  According to the patient the abdominal cramping is improving.  No known aggravating factors.  She took some Tylenol which helped the pain.  No fever or chills.  No dysuria urgency or frequency.  Last menstrual period was about 3-1/2 weeks ago.   HPI  Past Medical History:  Diagnosis Date  . Chlamydia   . Heart murmur   . Migraine     Patient Active Problem List   Diagnosis Date Noted  . Encounter for initial prescription of contraceptive pills 05/02/2019  . IUD check up 12/21/2018  . Normal labor 11/22/2016  . 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 NO PAST SURGERIES      OB History    Gravida  3   Para  3   Term  1   Preterm  2   AB      Living  3     SAB      TAB      Ectopic      Multiple  0   Live Births  3            Home Medications    Prior to Admission medications   Medication Sig Start Date End Date Taking? Authorizing Provider  ibuprofen (ADVIL) 600 MG tablet Take 1 tablet (600 mg total) by mouth every 8 (eight) hours as needed for cramping. 09/14/19   Jeani Fassnacht, Myrene Galas, MD  norgestimate-ethinyl estradiol (ORTHO-CYCLEN) 0.25-35  MG-MCG tablet Take 1 tablet by mouth daily. 05/02/19   Starr Lake, CNM    Family History Family History  Problem Relation Age of Onset  . Asthma Mother     Social History Social History   Tobacco Use  . Smoking status: Never Smoker  . Smokeless tobacco: Never Used  Substance Use Topics  . Alcohol use: No  . Drug use: No     Allergies   Kiwi extract, Penicillins, and Lobster [shellfish allergy]   Review of Systems Review of Systems  Constitutional: Negative for activity change, fatigue and fever.  HENT: Negative.   Gastrointestinal: Positive for abdominal pain, nausea and vomiting. Negative for blood in stool and diarrhea.  Genitourinary: Positive for menstrual problem and vaginal bleeding. Negative for dyspareunia, dysuria, hematuria and vaginal discharge.  Musculoskeletal: Negative for joint swelling.  Skin: Negative.   Neurological: Negative for dizziness, numbness and headaches.     Physical Exam Triage Vital Signs ED Triage Vitals  Enc Vitals Group     BP      Pulse      Resp      Temp  Temp src      SpO2      Weight      Height      Head Circumference      Peak Flow      Pain Score      Pain Loc      Pain Edu?      Excl. in [place]?    No data found.  Updated Vital Signs BP 119/70 (BP Location: Right Arm)   Pulse 93   Temp 99 F (37.2 C) (Oral)   Resp 16   Wt 54.4 kg   LMP 09/14/2019   SpO2 100%   BMI 20.60 kg/m   Visual Acuity Right Eye Distance:   Left Eye Distance:   Bilateral Distance:    Right Eye Near:   Left Eye Near:    Bilateral Near:     Physical Exam Constitutional:      General: She is not in acute distress.    Appearance: She is not ill-appearing.  Cardiovascular:     Rate and Rhythm: Normal rate and regular rhythm.     Pulses: Normal pulses.     Heart sounds: No murmur. No friction rub.  Pulmonary:     Effort: Pulmonary effort is normal. No respiratory distress.     Breath sounds: Normal breath  sounds. No stridor.  Abdominal:     General: There is no distension.     Palpations: There is no mass.     Tenderness: There is abdominal tenderness. There is no right CVA tenderness, left CVA tenderness, guarding or rebound.  Musculoskeletal:        General: No swelling or signs of injury. Normal range of motion.  Skin:    General: Skin is warm.     Findings: No bruising or erythema.  Neurological:     Mental Status: She is alert.      UC Treatments / Results  Labs (all labs ordered are listed, but only abnormal results are displayed) Labs Reviewed  POCT URINALYSIS DIP (DEVICE) - Abnormal; Notable for the following components:      Result Value   Hgb urine dipstick LARGE (*)    pH 8.5 (*)    Protein, ur 30 (*)    Urobilinogen, UA 4.0 (*)    All other components within normal limits  POCT PREGNANCY, URINE    EKG   Radiology No results found.  Procedures Procedures (including critical care time)  Medications Ordered in UC Medications  ketorolac (TORADOL) 30 MG/ML injection 30 mg (has no administration in time range)    Initial Impression / Assessment and Plan / UC Course  I have reviewed the triage vital signs and the nursing notes.  Pertinent labs & imaging results that were available during my care of the patient were reviewed by me and considered in my medical decision making (see chart for details).     1.  Dysmenorrhea: Patient's is due to have her menstrual period around 15 February.  I suspect that this bleeding is part of her normal menstrual period.  Urine pregnancy test is negative.  Patient to be given a dose of Toradol for abdominal pain currently 10 out of 10.  Ibuprofen 600 mg every 8 hours as needed for abdominal pain/cramps. Final Clinical Impressions(s) / UC Diagnoses   Final diagnoses:  Dysmenorrhea   Discharge Instructions   Brandy [name]    ED Prescriptions    Medication Sig Dispense Auth. Provider   ibuprofen (ADVIL) 600 MG tablet Take  1  tablet (600 mg total) by mouth every 8 (eight) hours as needed for cramping. 21 tablet Darrold Bezek, Britta Mccreedy, MD     PDMP not reviewed this encounter.   Merrilee Jansky, MD 09/14/19 [phone]

## 2019-09-14 NOTE — ED Triage Notes (Signed)
Pt state she has vaginal bleeding and cramping. Pt state she has been having some cramping that started last night. Pt state she woke up in a pool of blood 45 minutes ago. Pt states she took a home pregnancy test at home and it read positive.

## 2019-09-30 DIAGNOSIS — H1013 Acute atopic conjunctivitis, bilateral: Secondary | ICD-10-CM | POA: Diagnosis not present

## 2020-01-31 ENCOUNTER — Other Ambulatory Visit: Payer: Self-pay

## 2020-01-31 ENCOUNTER — Encounter (HOSPITAL_COMMUNITY): Payer: Self-pay

## 2020-01-31 ENCOUNTER — Ambulatory Visit (HOSPITAL_COMMUNITY)
Admission: EM | Admit: 2020-01-31 | Discharge: 2020-01-31 | Disposition: A | Discharge status: Home / Self Care | Payer: Medicaid Other | Attending: Internal Medicine | Admitting: Internal Medicine

## 2020-01-31 DIAGNOSIS — G43009 Migraine without aura, not intractable, without status migrainosus: Secondary | ICD-10-CM | POA: Diagnosis not present

## 2020-01-31 MED ORDER — KETOROLAC TROMETHAMINE 30 MG/ML IJ SOLN
30.0000 mg | Freq: Once | INTRAMUSCULAR | Status: AC
  Administered 2020-01-31: 30 mg via INTRAMUSCULAR

## 2020-01-31 MED ORDER — KETOROLAC TROMETHAMINE 30 MG/ML IJ SOLN
INTRAMUSCULAR | Status: AC
  Filled 2020-01-31: qty 1

## 2020-01-31 MED ORDER — DEXAMETHASONE SODIUM PHOSPHATE 10 MG/ML IJ SOLN
INTRAMUSCULAR | Status: AC
  Filled 2020-01-31: qty 1

## 2020-01-31 MED ORDER — DEXAMETHASONE SODIUM PHOSPHATE 10 MG/ML IJ SOLN
10.0000 mg | Freq: Once | INTRAMUSCULAR | Status: AC
  Administered 2020-01-31: 10 mg via INTRAMUSCULAR

## 2020-01-31 MED ORDER — METOCLOPRAMIDE HCL 5 MG/ML IJ SOLN
5.0000 mg | Freq: Once | INTRAMUSCULAR | Status: AC
  Administered 2020-01-31: 5 mg via INTRAMUSCULAR

## 2020-01-31 MED ORDER — METOCLOPRAMIDE HCL 5 MG/ML IJ SOLN
INTRAMUSCULAR | Status: AC
  Filled 2020-01-31: qty 2

## 2020-01-31 NOTE — ED Provider Notes (Addendum)
[facility]    CSN: [id number] Arrival date & time: 01/31/20  1725      History   Chief Complaint Chief Complaint  Patient presents with  . Headache    HPI Brandy [name] is a 24 y.o. female with history of headaches in the past presents to urgent care with 2-day history of headache becoming increasingly worse.  Patient states symptoms similar to previous headaches.  Patient states pain unrelieved with Motrin.  Headache associated with nausea, vomiting and light sensitivity, and increased tiredness.  Patient denies any dizziness or weakness.    Past Medical History:  Diagnosis Date  . Chlamydia   . Heart murmur   . Migraine     Patient Active Problem List   Diagnosis Date Noted  . Encounter for initial prescription of contraceptive pills 05/02/2019  . IUD check up 12/21/2018  . Normal labor 11/22/2016  . 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 NO PAST SURGERIES      OB History    Gravida  3   Para  3   Term  1   Preterm  2   AB      Living  3     SAB      TAB      Ectopic      Multiple  0   Live Births  3            Home Medications    Prior to Admission medications   Medication Sig Start Date End Date Taking? Authorizing Provider  ibuprofen (ADVIL) 600 MG tablet Take 1 tablet (600 mg total) by mouth every 8 (eight) hours as needed for cramping. 09/14/19   Lamptey, Britta Mccreedy, MD    Family History Family History  Problem Relation Age of Onset  . Asthma Mother     Social History Social History   Tobacco Use  . Smoking status: Never Smoker  . Smokeless tobacco: Never Used  Substance Use Topics  . Alcohol use: No  . Drug use: No     Allergies   Kiwi extract, Penicillins, and Lobster [shellfish allergy]   Review of Systems As stated in HPI otherwise negative   Physical Exam Triage Vital  Signs ED Triage Vitals [01/31/20 1853]  Enc Vitals Group     BP 100/68     Pulse Rate 68     Resp 18     Temp 98.1 F (36.7 C)     Temp src      SpO2 98 %     Weight      Height      Head Circumference      Peak Flow      Pain Score 10     Pain Loc      Pain Edu?      Excl. in GC?    No data found.  Updated Vital Signs BP 100/68   Pulse 68   Temp 98.1 F (36.7 C)   Resp 18   LMP 12/29/2019   SpO2 98%      Physical Exam Constitutional:      Appearance: She is well-developed.  HENT:     Head: Normocephalic and atraumatic.  Eyes:     Extraocular Movements: Extraocular movements intact.     Pupils: Pupils are equal, round, and reactive to light.  Musculoskeletal:  Cervical back: Normal range of motion and neck supple. No rigidity.  Skin:    General: Skin is warm and dry.  Neurological:     Mental Status: She is alert.     Cranial Nerves: No dysarthria or facial asymmetry.     Motor: No weakness.  Psychiatric:        Mood and Affect: Mood normal.        Behavior: Behavior normal.     UC Treatments / Results  Labs (all labs ordered are listed, but only abnormal results are displayed) Labs Reviewed - No data to display  EKG   Radiology No results found.  Procedures Procedures (including critical care time)  Medications Ordered in UC Medications  ketorolac (TORADOL) 30 MG/ML injection 30 mg (has no administration in time range)  metoCLOPramide (REGLAN) injection 5 mg (has no administration in time range)  dexamethasone (DECADRON) injection 10 mg (has no administration in time range)    Initial Impression / Assessment and Plan / UC Course  I have reviewed the triage vital signs and the nursing notes.  Pertinent labs & imaging results that were available during my care of the patient were reviewed by me and considered in my medical decision making (see chart for details).  Headache -c/w migraine headache with hx of similar though pt states no  formal dx. -IM dexamethasone, toradol, reglan in office -f/u as needed  Final Clinical Impressions(s) / UC Diagnoses   Final diagnoses:  Migraine without aura and without status migrainosus, not intractable     Discharge Instructions     Return for any worsening symptoms.  Drink plenty of fluids and rest    ED Prescriptions    None     PDMP not reviewed this encounter.   Rolla Etienne, NP 01/31/20 1911    Rolla Etienne, NP 02/01/20 2008

## 2020-01-31 NOTE — Discharge Instructions (Addendum)
Return for any worsening symptoms.  Drink plenty of fluids and rest

## 2020-01-31 NOTE — ED Triage Notes (Signed)
Headache x 2 days, ibuprofen not helping, now has nausea/vomiting

## 2020-02-02 ENCOUNTER — Encounter (HOSPITAL_COMMUNITY): Payer: Self-pay

## 2020-02-02 ENCOUNTER — Emergency Department (HOSPITAL_COMMUNITY)
Admission: EM | Admit: 2020-02-02 | Discharge: 2020-02-02 | Disposition: A | Discharge status: Home / Self Care | Payer: Medicaid Other | Attending: Emergency Medicine | Admitting: Emergency Medicine

## 2020-02-02 ENCOUNTER — Other Ambulatory Visit: Payer: Self-pay

## 2020-02-02 DIAGNOSIS — G44209 Tension-type headache, unspecified, not intractable: Secondary | ICD-10-CM | POA: Diagnosis not present

## 2020-02-02 DIAGNOSIS — R519 Headache, unspecified: Secondary | ICD-10-CM | POA: Diagnosis present

## 2020-02-02 LAB — BASIC METABOLIC PANEL
Anion gap: 7 (ref 5–15)
BUN: 14 mg/dL (ref 6–20)
CO2: 27 mmol/L (ref 22–32)
Calcium: 9 mg/dL (ref 8.9–10.3)
Chloride: 106 mmol/L (ref 98–111)
Creatinine, Ser: 0.88 mg/dL (ref 0.44–1.00)
GFR calc Af Amer: >60 mL/min (ref >60)
GFR calc non Af Amer: >60 mL/min (ref >60)
Glucose, Bld: 95 mg/dL (ref 70–99)
Potassium: 3.6 mmol/L (ref 3.5–5.1)
Sodium: 140 mmol/L (ref 135–145)

## 2020-02-02 LAB — CBC
HCT: 37.4 % (ref 36.0–46.0)
Hemoglobin: 12.1 g/dL (ref 12.0–15.0)
MCH: 33.6 pg (ref 26.0–34.0)
MCHC: 32.4 g/dL (ref 30.0–36.0)
MCV: 103.9 fL — ABNORMAL HIGH (ref 80.0–100.0)
Platelets: 269 10*3/uL (ref 150–400)
RBC: 3.6 MIL/uL — ABNORMAL LOW (ref 3.87–5.11)
RDW: 11.6 % (ref 11.5–15.5)
WBC: 3.6 10*3/uL — ABNORMAL LOW (ref 4.0–10.5)
nRBC: 0 % (ref 0.0–0.2)

## 2020-02-02 LAB — I-STAT BETA HCG BLOOD, ED (MC, WL, AP ONLY): I-stat hCG, quantitative: <5 m[IU]/mL (ref <5)

## 2020-02-02 MED ORDER — METOCLOPRAMIDE HCL 5 MG/ML IJ SOLN
10.0000 mg | Freq: Once | INTRAMUSCULAR | Status: AC
  Administered 2020-02-02: 10 mg via INTRAVENOUS
  Filled 2020-02-02: qty 2

## 2020-02-02 MED ORDER — KETOROLAC TROMETHAMINE 15 MG/ML IJ SOLN
15.0000 mg | Freq: Once | INTRAMUSCULAR | Status: AC
  Administered 2020-02-02: 15 mg via INTRAVENOUS
  Filled 2020-02-02: qty 1

## 2020-02-02 MED ORDER — DEXAMETHASONE SODIUM PHOSPHATE 4 MG/ML IJ SOLN
4.0000 mg | Freq: Once | INTRAMUSCULAR | Status: AC
  Administered 2020-02-02: 4 mg via INTRAVENOUS
  Filled 2020-02-02: qty 1

## 2020-02-02 NOTE — ED Provider Notes (Signed)
[facility] EMERGENCY DEPARTMENT Provider Note   CSN: [id number] Arrival date & time: 02/02/20  1534     History Chief Complaint  Patient presents with  . Migraine    Brandy [name] is a 24 y.o. female.  Patient is a 24 year old nonpregnant female that presents with 3 days of headache.  She states her headache is 9 out of 10 pain and located bilateral temples.  Patient states that 2 days ago she went to urgent care and was provided with 3 medications via injection which did not improve her headache.  Patient states that she often gets a "migraine" headache 1-2 times a year and every year or 2 has to come into the emergency department.  She states last time she was in the emergency department she received some medication which resolved her headache.  Patient denies fevers, pain with her neck, photophobia, chills, abdominal pain.  She does state that she today she was seen a few spots.  She endorses some nausea with vomiting with 3 bouts of vomiting yesterday and 2 bouts earlier today.  Patient denies alcohol, tobacco, illicit drug use.  Denies any recent changes in medication.  States she does not on any birth control, was previously on an IUD but it came out several months ago.        Past Medical History:  Diagnosis Date  . Chlamydia   . Heart murmur   . Migraine     Patient Active Problem List   Diagnosis Date Noted  . Encounter for initial prescription of contraceptive pills 05/02/2019  . IUD check up 12/21/2018  . Normal labor 11/22/2016  . 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 NO PAST SURGERIES       OB History    Gravida  3   Para  3   Term  1   Preterm  2   AB      Living  3     SAB      TAB      Ectopic      Multiple  0   Live Births  3           Family History  Problem Relation Age of Onset  .  Asthma Mother     Social History   Tobacco Use  . Smoking status: Never Smoker  . Smokeless tobacco: Never Used  Substance Use Topics  . Alcohol use: No  . Drug use: No    Home Medications Prior to Admission medications   Medication Sig Start Date End Date Taking? Authorizing Provider  ibuprofen (ADVIL) 200 MG tablet Take 400 mg by mouth as needed for headache or mild pain.   Yes [provider]  ibuprofen (ADVIL) 600 MG tablet Take 1 tablet (600 mg total) by mouth every 8 (eight) hours as needed for cramping. Patient not taking: Reported on 02/02/2020 09/14/19   Merrilee Jansky, MD    Allergies    Kiwi extract, Penicillins, and Lobster [shellfish allergy]  Review of Systems   Review of Systems  Constitutional: Negative for chills and fever.  HENT: Negative for sore throat.   Eyes: Positive for visual disturbance (seeing some spots today). Negative for photophobia.  Respiratory: Negative for shortness of breath.   Cardiovascular: Negative for chest pain.  Gastrointestinal: Positive for abdominal pain (cramping), nausea and vomiting (3-4x yesterday, 2x  today).  Genitourinary: Negative for dysuria.  Musculoskeletal: Negative for neck stiffness.  Neurological: Negative for dizziness and numbness.    Physical Exam Updated Vital Signs BP 96/70 (BP Location: Left Arm)   Pulse 74   Temp 98.3 F (36.8 C) (Oral)   Resp 18   Ht 5\' 4"  (1.626 m)   Wt 56.7 kg   SpO2 100%   BMI 21.46 kg/m   Physical Exam Constitutional:      Appearance: Normal appearance.  HENT:     Head: Normocephalic and atraumatic.  Eyes:     Extraocular Movements: Extraocular movements intact.     Pupils: Pupils are equal, round, and reactive to light.  Cardiovascular:     Rate and Rhythm: Normal rate and regular rhythm.     Heart sounds: Normal heart sounds. No murmur heard.   Pulmonary:     Effort: Pulmonary effort is normal. No respiratory distress.     Breath sounds: Normal breath  sounds. No wheezing.  Abdominal:     General: Abdomen is flat. Bowel sounds are normal.     Palpations: Abdomen is soft.     Tenderness: There is no abdominal tenderness.  Musculoskeletal:     Cervical back: Normal range of motion and neck supple. No rigidity.  Skin:    General: Skin is warm and dry.  Neurological:     General: No focal deficit present.     Mental Status: She is alert.     Cranial Nerves: No cranial nerve deficit.     Sensory: No sensory deficit.     Motor: No weakness.     ED Results / Procedures / Treatments   Labs (all labs ordered are listed, but only abnormal results are displayed) Labs Reviewed  CBC - Abnormal; Notable for the following components:      Result Value   WBC 3.6 (*)    RBC 3.60 (*)    MCV 103.9 (*)    All other components within normal limits  BASIC METABOLIC PANEL  I-STAT BETA HCG BLOOD, ED ([facility], [facility], [facility] ONLY)    EKG None  Radiology No results found.  Procedures Procedures (including critical care time)  Medications Ordered in ED Medications  metoCLOPramide (REGLAN) injection 10 mg (10 mg Intravenous Given 02/02/20 2036)  dexamethasone (DECADRON) injection 4 mg (4 mg Intravenous Given 02/02/20 2035)  ketorolac (TORADOL) 15 MG/ML injection 15 mg (15 mg Intravenous Given 02/02/20 2035)    ED Course  I have reviewed the triage vital signs and the nursing notes.  Pertinent labs & imaging results that were available during my care of the patient were reviewed by me and considered in my medical decision making (see chart for details).  Initial labs significant for negative pregnancy test, BMP shows no electrolyte abnormalities.  CBC shows no anemia.  Provided patient IV Toradol, Reglan, dexamethasone.  This improved patient's headache and patient requested to be discharged home at that point.  Return precautions were given to the patient prior to discharge.   MDM Rules/Calculators/A&P                          Patient with 3 days of  headache, similar to previous headaches that she has had, associated with some nausea and vomiting.  Located bilateral temples.  Low concern for meningitis with no nuchal rigidity or tenderness and no fevers.  Patient with a normal neuro exam and no recent trauma which makes intracranial hemorrhage less  likely.  Intracranial mass/tumor unlikely with normal neuro exam.  Patient's headache improved with headache cocktail which was also reassuring.  Final Clinical Impression(s) / ED Diagnoses Final diagnoses:  Tension headache  Bad headache    Rx / DC Orders ED Discharge Orders    None       Jackelyn Poling, DO 02/02/20 2234    Tilden Fossa, MD 02/02/20 2253

## 2020-02-02 NOTE — Discharge Instructions (Signed)
You came to the emergency department due to a prolonged headache.  In the emergency department we gave you some medicine to help improve this headache as well as to help prevent a rebound headache after you leave.  This seemed to be effective in removing your headache and you were discharged home.   If you begin getting headaches again you can take Tylenol or ibuprofen to help with these.  These medicines are most effective when taken at the start of the headache.  Please return if you develop new symptoms with a headache including any numbness in your limbs, weakness, confusion or dizziness, ongoing vomiting.

## 2020-02-02 NOTE — ED Triage Notes (Signed)
Pt arrives to ED w/ c/o 10/10 migraine headache x 3 days. Pt states she was seen at UC 2 days ago for the same but no relief of symptoms. Pt endorses n/v.

## 2020-02-02 NOTE — ED Notes (Signed)
Discharge instructions reviewed with pt. Pt verbalized understanding.   

## 2020-07-08 DIAGNOSIS — H40033 Anatomical narrow angle, bilateral: Secondary | ICD-10-CM | POA: Diagnosis not present

## 2020-07-08 DIAGNOSIS — G44209 Tension-type headache, unspecified, not intractable: Secondary | ICD-10-CM | POA: Diagnosis not present

## 2020-07-21 DIAGNOSIS — H5213 Myopia, bilateral: Secondary | ICD-10-CM | POA: Diagnosis not present

## 2020-08-06 DIAGNOSIS — H5213 Myopia, bilateral: Secondary | ICD-10-CM | POA: Diagnosis not present

## 2020-08-06 DIAGNOSIS — H1013 Acute atopic conjunctivitis, bilateral: Secondary | ICD-10-CM | POA: Diagnosis not present

## 2020-08-16 ENCOUNTER — Encounter (HOSPITAL_COMMUNITY): Payer: Self-pay

## 2020-08-16 ENCOUNTER — Other Ambulatory Visit: Payer: Self-pay

## 2020-08-16 ENCOUNTER — Ambulatory Visit (INDEPENDENT_AMBULATORY_CARE_PROVIDER_SITE_OTHER): Payer: Medicaid Other

## 2020-08-16 ENCOUNTER — Ambulatory Visit (HOSPITAL_COMMUNITY)
Admission: EM | Admit: 2020-08-16 | Discharge: 2020-08-16 | Disposition: A | Discharge status: Home / Self Care | Payer: Medicaid Other | Attending: Emergency Medicine | Admitting: Emergency Medicine

## 2020-08-16 DIAGNOSIS — M25532 Pain in left wrist: Secondary | ICD-10-CM

## 2020-08-16 DIAGNOSIS — S63502A Unspecified sprain of left wrist, initial encounter: Secondary | ICD-10-CM

## 2020-08-16 NOTE — ED Provider Notes (Signed)
[facility]    CSN: [id number] Arrival date & time: 08/16/20  1748      History   Chief Complaint Chief Complaint  Patient presents with  . Wrist Pain    HPI Zykia Bergdoll is a 25 y.o. female.   HPI Ronnell Liaw is a 25 y.o. female presenting to UC with c/o Left wrist pain, swelling and small scrape that occurred earlier this morning when she tripped going down cement stairs. Pt fell backward and grabbed for the handrail but missed, hit her Left wrist/forearm on the stairs. Pain is aching and sore, 8/10, worse with movement. She took tylenol around 11AM with mild relief. Denies hitting her head or other injuries from the fall.   Past Medical History:  Diagnosis Date  . Chlamydia   . Heart murmur   . Migraine     Patient Active Problem List   Diagnosis Date Noted  . Encounter for initial prescription of contraceptive pills 05/02/2019  . IUD check up 12/21/2018  . Normal labor 11/22/2016  . 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 NO PAST SURGERIES      OB History    Gravida  3   Para  3   Term  1   Preterm  2   AB      Living  3     SAB      IAB      Ectopic      Multiple  0   Live Births  3            Home Medications    Prior to Admission medications   Medication Sig Start Date End Date Taking? Authorizing Provider  ibuprofen (ADVIL) 200 MG tablet Take 400 mg by mouth as needed for headache or mild pain.    [provider]  ibuprofen (ADVIL) 600 MG tablet Take 1 tablet (600 mg total) by mouth every 8 (eight) hours as needed for cramping. Patient not taking: Reported on 02/02/2020 09/14/19   Merrilee Jansky, MD    Family History Family History  Problem Relation Age of Onset  . Asthma Mother     Social History Social History   Tobacco Use  . Smoking status: Never Smoker  . Smokeless  tobacco: Never Used  Substance Use Topics  . Alcohol use: No  . Drug use: No     Allergies   Kiwi extract, Penicillins, and Lobster [shellfish allergy]   Review of Systems Review of Systems  Musculoskeletal: Positive for arthralgias and joint swelling.  Skin: Positive for wound. Negative for color change.  Neurological: Negative for weakness and numbness.     Physical Exam Triage Vital Signs ED Triage Vitals  Enc Vitals Group     BP 08/16/20 1932 124/68     Pulse Rate 08/16/20 1932 (!) 55     Resp 08/16/20 1932 16     Temp 08/16/20 1932 98.8 F (37.1 C)     Temp Source 08/16/20 1932 Oral     SpO2 08/16/20 1932 100 %     Weight --      Height --      Head Circumference --      Peak Flow --      Pain Score 08/16/20 1931 8     Pain Loc --      Pain Edu? --  Excl. in GC? --    No data found.  Updated Vital Signs BP 124/68 (BP Location: Right Arm)   Pulse (!) 55   Temp 98.8 F (37.1 C) (Oral)   Resp 16   LMP 08/07/2020   SpO2 100%   Visual Acuity Right Eye Distance:   Left Eye Distance:   Bilateral Distance:    Right Eye Near:   Left Eye Near:    Bilateral Near:     Physical Exam Vitals and nursing note reviewed.  Constitutional:      Appearance: Normal appearance. She is well-developed and well-nourished.  HENT:     Head: Normocephalic and atraumatic.  Eyes:     Extraocular Movements: EOM normal.  Cardiovascular:     Rate and Rhythm: Normal rate.  Pulmonary:     Effort: Pulmonary effort is normal.  Musculoskeletal:        General: Swelling and tenderness present. Normal range of motion.     Cervical back: Normal range of motion.     Comments: Left wrist: mild edema to ulnar aspect to mid forearm. Tenderness. Full ROM, increased pain with end range.  Left hand: non-tender. Full ROM, 5/5 grip strength  Skin:    General: Skin is warm and dry.  Neurological:     Mental Status: She is alert and oriented to person, place, and time.   Psychiatric:        Mood and Affect: Mood and affect normal.        Behavior: Behavior normal.      UC Treatments / Results  Labs (all labs ordered are listed, but only abnormal results are displayed) Labs Reviewed - No data to display  EKG   Radiology DG Wrist Complete Left  Result Date: 08/16/2020 CLINICAL DATA:  Left wrist pain for 1 day since carrying a box. EXAM: LEFT WRIST - COMPLETE 3+ VIEW COMPARISON:  None. FINDINGS: There is no evidence of fracture or dislocation. There is no evidence of arthropathy or other focal bone abnormality. Soft tissues are unremarkable. IMPRESSION: Normal exam. Electronically Signed   By: Drusilla Kanner M.D.   On: 08/16/2020 20:00    Procedures Procedures (including critical care time)  Medications Ordered in UC Medications - No data to display  Initial Impression / Assessment and Plan / UC Course  I have reviewed the triage vital signs and the nursing notes.  Pertinent labs & imaging results that were available during my care of the patient were reviewed by me and considered in my medical decision making (see chart for details).    Discussed imagine with pt  Will tx as a sprain Splint provided for comfort F/u with sports medicine as needed AVS given  Final Clinical Impressions(s) / UC Diagnoses   Final diagnoses:  Sprain of left wrist, initial encounter     Discharge Instructions      You may wear the splint for comfort and support. Remove to wash your hands and apply a cool compress 2-3 times daily for pain and inflammation. You may take 500mg  acetaminophen every 4-6 hours or in combination with ibuprofen 400-600mg  every 6-8 hours as needed for pain and inflammation.  Call to schedule an appointment with sports medicine in 1-2 weeks if not improving.    ED Prescriptions    None     PDMP not reviewed this encounter.   , Lurene Shadow 08/16/20 2053

## 2020-08-16 NOTE — ED Triage Notes (Signed)
Pt c/o pain to left wrist onset this morning after falling while descending stairs. Pt states she fell backwards and struck left wrist on railing. Denies LOC, head injury.  +2 radial pulse, fingers warm, cap refill brisk. Limited ROM and mild edema noted. Took two tylenol at approx 1100 this morning.

## 2020-08-16 NOTE — Discharge Instructions (Signed)
  You may wear the splint for comfort and support. Remove to wash your hands and apply a cool compress 2-3 times daily for pain and inflammation. You may take 500mg  acetaminophen every 4-6 hours or in combination with ibuprofen 400-600mg  every 6-8 hours as needed for pain and inflammation.  Call to schedule an appointment with sports medicine in 1-2 weeks if not improving.

## 2020-11-13 ENCOUNTER — Ambulatory Visit (HOSPITAL_COMMUNITY)
Admission: EM | Admit: 2020-11-13 | Discharge: 2020-11-13 | Disposition: A | Discharge status: Home / Self Care | Payer: Medicaid Other | Attending: Emergency Medicine | Admitting: Emergency Medicine

## 2020-11-13 ENCOUNTER — Other Ambulatory Visit: Payer: Self-pay

## 2020-11-13 ENCOUNTER — Encounter (HOSPITAL_COMMUNITY): Payer: Self-pay

## 2020-11-13 DIAGNOSIS — J069 Acute upper respiratory infection, unspecified: Secondary | ICD-10-CM | POA: Insufficient documentation

## 2020-11-13 DIAGNOSIS — Z20822 Contact with and (suspected) exposure to covid-19: Secondary | ICD-10-CM | POA: Insufficient documentation

## 2020-11-13 LAB — POC INFLUENZA A AND B ANTIGEN (URGENT CARE ONLY)
Influenza A Ag: NEGATIVE
Influenza B Ag: NEGATIVE

## 2020-11-13 LAB — SARS CORONAVIRUS 2 (TAT 6-24 HRS): SARS Coronavirus 2: NEGATIVE

## 2020-11-13 MED ORDER — ACETAMINOPHEN 325 MG PO TABS
650.0000 mg | ORAL_TABLET | Freq: Once | ORAL | Status: AC
  Administered 2020-11-13: 650 mg via ORAL

## 2020-11-13 MED ORDER — PSEUDOEPH-BROMPHEN-DM 30-2-10 MG/5ML PO SYRP: 10.0000 mL | ORAL_SOLUTION | Freq: Four times a day (QID) | ORAL | 0 refills | Status: DC | PRN

## 2020-11-13 MED ORDER — ACETAMINOPHEN 325 MG PO TABS
ORAL_TABLET | ORAL | Status: AC
  Filled 2020-11-13: qty 2

## 2020-11-13 NOTE — Discharge Instructions (Addendum)
Flu test negative. COVID test pending. Take medication as prescribed to help with cough and congestion. Can also take Tylenol and/or ibuprofen to help with discomfort. Follow up with PCP if no improvement in a week. Go to the ER if develop difficulty breathing, inability to swallow, or other new severe symptoms.

## 2020-11-13 NOTE — ED Provider Notes (Signed)
[facility]    CSN: [id number] Arrival date & time: 11/13/20  1127      History   Chief Complaint Chief Complaint  Patient presents with  . Fever  . Cough  . Sore Throat  . Chills    HPI Brandy [name] is a 25 y.o. female.   Patient presents feeling unwell for the past 3 days. She states she feels like she has the flu, with headache, body aches, fatigue, fever up to 103F, nasal congestion, sore throat, and productive cough. She denies difficulty breathing, nausea or diarrhea, or rash. She states she threw up once due to coughing so hard. The patient denies known sick contacts but was around others at a get-together a few days before starting to feel unwell. She has been taking Theraflu with minimal improvement.    Fever Associated symptoms: chills, congestion, cough, headaches, myalgias, rhinorrhea, sore throat and vomiting (post-tussive)   Associated symptoms: no chest pain, no diarrhea, no ear pain, no nausea and no rash   Cough Associated symptoms: chills, fever, headaches, myalgias, rhinorrhea and sore throat   Associated symptoms: no chest pain, no ear pain, no rash and no shortness of breath   Sore Throat Associated symptoms include headaches. Pertinent negatives include no chest pain and no shortness of breath.    Past Medical History:  Diagnosis Date  . Chlamydia   . Heart murmur   . Migraine     Patient Active Problem List   Diagnosis Date Noted  . Encounter for initial prescription of contraceptive pills 05/02/2019  . IUD check up 12/21/2018  . Normal labor 11/22/2016  . 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 NO PAST SURGERIES      OB History    Gravida  3   Para  3   Term  1   Preterm  2   AB      Living  3     SAB      IAB      Ectopic      Multiple  0   Live Births  3            Home  Medications    Prior to Admission medications   Medication Sig Start Date End Date Taking? Authorizing Provider  brompheniramine-pseudoephedrine-DM 30-2-10 MG/5ML syrup Take 10 mLs by mouth 4 (four) times daily as needed (cough, congestion). 11/13/20  Yes Ayssa Bentivegna L, PA  ibuprofen (ADVIL) 200 MG tablet Take 400 mg by mouth as needed for headache or mild pain.    [provider]  ibuprofen (ADVIL) 600 MG tablet Take 1 tablet (600 mg total) by mouth every 8 (eight) hours as needed for cramping. Patient not taking: Reported on 02/02/2020 09/14/19   Merrilee Jansky, MD    Family History Family History  Problem Relation Age of Onset  . Asthma Mother     Social History Social History   Tobacco Use  . Smoking status: Never Smoker  . Smokeless tobacco: Never Used  Substance Use Topics  . Alcohol use: No  . Drug use: No     Allergies   Kiwi extract, Penicillins, and Lobster [shellfish allergy]   Review of Systems Review of Systems  Constitutional: Positive for chills, fatigue and fever.  HENT: Positive for congestion, rhinorrhea and sore throat. Negative for ear pain and sinus pressure.  Eyes: Negative for redness.  Respiratory: Positive for cough. Negative for chest tightness and shortness of breath.   Cardiovascular: Negative for chest pain.  Gastrointestinal: Positive for vomiting (post-tussive). Negative for diarrhea and nausea.  Musculoskeletal: Positive for myalgias.  Skin: Negative for rash.  Neurological: Positive for headaches. Negative for dizziness.     Physical Exam Triage Vital Signs ED Triage Vitals  Enc Vitals Group     BP 11/13/20 1224 110/72     Pulse Rate 11/13/20 1224 84     Resp 11/13/20 1224 18     Temp 11/13/20 1224 99.7 F (37.6 C)     Temp src --      SpO2 11/13/20 1224 96 %     Weight --      Height --      Head Circumference --      Peak Flow --      Pain Score 11/13/20 1222 9     Pain Loc --      Pain Edu? --      Excl. in  [place]? --    No data found.  Updated Vital Signs BP 110/72   Pulse 84   Temp 99.7 F (37.6 C)   Resp 18   LMP 10/26/2020 (Exact Date)   SpO2 96%   Breastfeeding No   Visual Acuity Right Eye Distance:   Left Eye Distance:   Bilateral Distance:    Right Eye Near:   Left Eye Near:    Bilateral Near:     Physical Exam Vitals and nursing note reviewed.  Constitutional:      General: She is not in acute distress. HENT:     Head: Normocephalic and atraumatic.     Right Ear: Tympanic membrane, ear canal and external ear normal.     Left Ear: Tympanic membrane, ear canal and external ear normal.     Nose: Congestion present.     Mouth/Throat:     Mouth: Mucous membranes are moist.     Pharynx: Oropharynx is clear. No oropharyngeal exudate or posterior oropharyngeal erythema.  Eyes:     Conjunctiva/sclera: Conjunctivae normal.     Pupils: Pupils are equal, round, and reactive to light.  Cardiovascular:     Rate and Rhythm: Normal rate and regular rhythm.     Heart sounds: Normal heart sounds.  Pulmonary:     Effort: Pulmonary effort is normal.     Breath sounds: Normal breath sounds.  Lymphadenopathy:     Cervical: No cervical adenopathy.  Skin:    General: Skin is warm.     Findings: No rash.  Neurological:     Mental Status: She is alert.  Psychiatric:        Mood and Affect: Mood normal.      UC Treatments / Results  Labs (all labs ordered are listed, but only abnormal results are displayed) Labs Reviewed  SARS CORONAVIRUS 2 (TAT 6-24 HRS)  POC INFLUENZA A AND B ANTIGEN (URGENT CARE ONLY)    EKG   Radiology No results found.  Procedures Procedures (including critical care time)  Medications Ordered in UC Medications  acetaminophen (TYLENOL) tablet 650 mg (650 mg Oral Given 11/13/20 1401)    Initial Impression / Assessment and Plan / UC Course  I have reviewed the triage vital signs and the nursing notes.  Pertinent labs & imaging results that  were available during my care of the patient were reviewed by me and considered in my medical decision making (  see chart for details).     Viral URI, pending COVID. No warning signs.  Final Clinical Impressions(s) / UC Diagnoses   Final diagnoses:  Viral upper respiratory tract infection     Discharge Instructions     Flu test negative. COVID test pending. Take medication as prescribed to help with cough and congestion. Can also take Tylenol and/or ibuprofen to help with discomfort. Follow up with PCP if no improvement in a week. Go to the ER if develop difficulty breathing, inability to swallow, or other new severe symptoms.     ED Prescriptions    Medication Sig Dispense Auth. Provider   brompheniramine-pseudoephedrine-DM 30-2-10 MG/5ML syrup Take 10 mLs by mouth 4 (four) times daily as needed (cough, congestion). 120 mL Vallery Sa, Kingstyn Deruiter L, PA     PDMP not reviewed this encounter.   Estanislado Pandy, [place] 11/13/20 1430

## 2020-11-13 NOTE — ED Triage Notes (Signed)
Pt in with c/o ST, subjective fever, cough and chills that have been going on for 3 days now   Pt has been taking theraflu for sxs

## 2020-12-17 ENCOUNTER — Ambulatory Visit (HOSPITAL_COMMUNITY)
Admission: EM | Admit: 2020-12-17 | Discharge: 2020-12-17 | Disposition: A | Discharge status: Home / Self Care | Payer: Medicaid Other

## 2020-12-17 ENCOUNTER — Other Ambulatory Visit: Payer: Self-pay

## 2020-12-17 NOTE — ED Notes (Addendum)
Called pt in lobby 3 x no answer.   

## 2021-03-09 ENCOUNTER — Inpatient Hospital Stay (HOSPITAL_COMMUNITY)
Admission: AD | Admit: 2021-03-09 | Discharge: 2021-03-09 | Disposition: A | Discharge status: Home / Self Care | Payer: Medicaid Other | Attending: Obstetrics & Gynecology | Admitting: Obstetrics & Gynecology

## 2021-03-09 ENCOUNTER — Other Ambulatory Visit: Payer: Self-pay

## 2021-03-09 ENCOUNTER — Telehealth: Payer: Self-pay | Admitting: Student

## 2021-03-09 DIAGNOSIS — O209 Hemorrhage in early pregnancy, unspecified: Secondary | ICD-10-CM | POA: Insufficient documentation

## 2021-03-09 DIAGNOSIS — Z3A00 Weeks of gestation of pregnancy not specified: Secondary | ICD-10-CM | POA: Insufficient documentation

## 2021-03-09 LAB — POCT PREGNANCY, URINE: Preg Test, Ur: NEGATIVE

## 2021-03-09 LAB — HCG, QUANTITATIVE, PREGNANCY: hCG, Beta Chain, Quant, S: <1 m[IU]/mL (ref <5)

## 2021-03-09 NOTE — MAU Note (Signed)
138.9 5'4" .Marland KitchenAlexus [name] is a 25 y.o. at Unknown here in MAU reporting: Had a positive pregnancy test yesterday. Reports right sided lower quadrant pain and a little dot of blood when she wipes.  LMP: -beginning of July  Pain score: 7/10 Vitals:   03/09/21 0508  BP: 120/69  Pulse: 65  Resp: 17  Temp: 99 F (37.2 C)  SpO2: 100%     Lab orders placed from triage: Pregnancy test

## 2021-03-09 NOTE — Telephone Encounter (Signed)
TC call to patient to discuss bHCG as patient was not in lobby when results were available at 0730. Left VM for patient to check her MyChart for message about results.   Brandy [name]

## 2021-07-20 ENCOUNTER — Ambulatory Visit (HOSPITAL_COMMUNITY)
Admission: EM | Admit: 2021-07-20 | Discharge: 2021-07-20 | Disposition: A | Discharge status: Home / Self Care | Payer: Medicaid Other | Attending: Internal Medicine | Admitting: Internal Medicine

## 2021-07-20 ENCOUNTER — Other Ambulatory Visit: Payer: Self-pay

## 2021-07-20 ENCOUNTER — Encounter (HOSPITAL_COMMUNITY): Payer: Self-pay

## 2021-07-20 ENCOUNTER — Ambulatory Visit (INDEPENDENT_AMBULATORY_CARE_PROVIDER_SITE_OTHER): Payer: Medicaid Other

## 2021-07-20 DIAGNOSIS — S60051A Contusion of right little finger without damage to nail, initial encounter: Secondary | ICD-10-CM | POA: Diagnosis not present

## 2021-07-20 DIAGNOSIS — M79641 Pain in right hand: Secondary | ICD-10-CM

## 2021-07-20 MED ORDER — TRAMADOL HCL 50 MG PO TABS: 50.0000 mg | ORAL_TABLET | Freq: Four times a day (QID) | ORAL | 0 refills | Status: DC | PRN

## 2021-07-20 NOTE — ED Triage Notes (Signed)
Pt reports jamming her R small finger into a car door last night.

## 2021-07-20 NOTE — ED Provider Notes (Signed)
[place]    CSN: [id number] Arrival date & time: 07/20/21  1713      History   Chief Complaint Chief Complaint  Patient presents with   Hand Pain    finger    HPI Kevona Suastegui is a 25 y.o. female presenting with R little finger pain following shutting it in a car door 12 hours ago. Medical history noncontributory.  States that she shot her right little finger in a car door.  Now with pain over the distal aspect.  Also with some numbness and tingling.  Denies pain or injury elsewhere including her other fingers.  She is right-handed.  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 Authorizing Provider  traMADol (ULTRAM) 50 MG tablet Take 1 tablet (50 mg total) by mouth every 6 (six) hours as needed. 07/20/21  Yes Hazel Sams, PA-C  brompheniramine-pseudoephedrine-DM 30-2-10 MG/5ML syrup Take 10 mLs by mouth 4 (four) times daily as needed (cough, congestion). 11/13/20   Abner Greenspan, Amy L, PA  ibuprofen (ADVIL) 200 MG tablet Take 400 mg by mouth as needed for headache or mild pain.    [provider]  ibuprofen (ADVIL) 600 MG tablet Take 1 tablet (600 mg total) by mouth every 8 (eight) hours as needed for cramping. Patient not taking: Reported on 02/02/2020 09/14/19   Chase Picket, MD    Family History Family History  Problem Relation Age of Onset   Asthma Mother     Social History Social History   Tobacco Use   Smoking status: Never   Smokeless tobacco: Never  Substance Use Topics   Alcohol use: No   Drug use: No     Allergies   Kiwi extract, Penicillins, and Lobster [shellfish allergy]   Review of Systems Review of Systems  Musculoskeletal:        R little finger pain  All other systems reviewed and are negative.   Physical Exam Triage Vital Signs ED Triage Vitals  Enc Vitals Group     BP 07/20/21 1759 106/73     Pulse Rate 07/20/21 1759 95     Resp 07/20/21 1759 19     Temp 07/20/21 1759 97.9 F (36.6 C)     Temp Source 07/20/21 1759 Oral     SpO2 07/20/21 1759 99 %     Weight --      Height --      Head Circumference --  Peak Flow --      Pain Score 07/20/21 1758 9     Pain Loc --      Pain Edu? --      Excl. in [place]? --    No data found.  Updated Vital Signs BP 106/73 (BP Location: Right Arm)    Pulse 95    Temp 97.9 F (36.6 C) (Oral)    Resp 19    LMP 07/19/2021 (Exact Date)    SpO2 99%   Visual Acuity Right Eye Distance:   Left Eye Distance:   Bilateral Distance:    Right Eye Near:   Left Eye Near:    Bilateral Near:     Physical Exam Vitals reviewed.  Constitutional:      General: She is not in acute distress.    Appearance: Normal appearance. She is not ill-appearing.  HENT:     Head: Normocephalic and atraumatic.  Pulmonary:     Effort: Pulmonary effort is normal.  Musculoskeletal:     Comments: R little finger- TTP and effusion distal phalanx. Exquisitely tender to palpation. No deformity to the nail. ROM DIP and PIP intact but with some pain. Sensation intact. Cap refill <2 seconds. Radial pulse 2+. No snuffbox tenderness. No other injury or trauma.  Neurological:     General: No focal deficit present.     Mental Status: She is alert and oriented to person, place, and time.  Psychiatric:         Mood and Affect: Mood normal.        Behavior: Behavior normal.        Thought Content: Thought content normal.        Judgment: Judgment normal.     UC Treatments / Results  Labs (all labs ordered are listed, but only abnormal results are displayed) Labs Reviewed - No data to display  EKG   Radiology DG Hand Complete Right  Result Date: 07/20/2021 CLINICAL DATA:  Shut hand in door last night, pain greatest in the distal tips of the fifth finger. EXAM: RIGHT HAND - COMPLETE 3+ VIEW COMPARISON:  None. FINDINGS: There is bony irregularity at the tuft of the distal phalanx of the fifth digit which has a chronic appearance. No definite acute fracture. No dislocation. There is no evidence of arthropathy or other focal bone abnormality. Soft tissues are unremarkable. IMPRESSION: Bony irregularity at the tuft of the distal phalanx of the fifth digit, favor sequela of remote prior injury. No definite acute finding. Electronically Signed   By: Audie Pinto M.D.   On: 07/20/2021 18:20    Procedures Procedures (including critical care time)  Medications Ordered in UC Medications - No data to display  Initial Impression / Assessment and Plan / UC Course  I have reviewed the triage vital signs and the nursing notes.  Pertinent labs & imaging results that were available during my care of the patient were reviewed by me and considered in my medical decision making (see chart for details).     This patient is a very pleasant 25 y.o. year old female presenting with R little finger contusion. Neurovascularly intact. Xray R hand - Bony irregularity at the tuft of the distal phalanx of the fifth digit, favor sequela of remote prior injury. No definite acute finding.   I suspect the bony irregularity is acute given her pain. Placed in finger splint and tramadol x15 tabs sent. States she is not pregnant or breastfeeding. F/u with ortho. Work note provided.  ED return precautions discussed.  Patient verbalizes understanding and agreement.     Final Clinical Impressions(s) / UC Diagnoses   Final diagnoses:  Contusion of right little finger without damage to nail, initial encounter     Discharge Instructions      -Tramadol for pain, up to every 6 hours. This medication can cause drowsiness, so don't take before driving. Don't drink alcohol while on this medication. -You can take ibuprofen and tylenol for additional relief:  -You can take Tylenol up to 1000 mg 3 times daily, and ibuprofen up to 600 mg 3 times daily with food.  You can take these together, or alternate every 3-4 hours. -If symptoms persist in 5-7 days, follow-up with an orthopedist. I recommend [facility] at [address] [place], [place] [address]. You can schedule an appointment by calling [phone]) or online ([email or web address]), but they also have a walk-in clinic M-F 8a-8p and Sat 10a-3p.       ED Prescriptions     Medication Sig Dispense Auth. Provider   traMADol (ULTRAM) 50 MG tablet Take 1 tablet (50 mg total) by mouth every 6 (six) hours as needed. 15 tablet Rhys Martini, PA-C      I have reviewed the PDMP during this encounter.   Rhys Martini, PA-C 07/20/21 [phone]

## 2021-07-20 NOTE — Discharge Instructions (Addendum)
-  Tramadol for pain, up to every 6 hours. This medication can cause drowsiness, so don't take before driving. Don't drink alcohol while on this medication. -You can take ibuprofen and tylenol for additional relief:  -You can take Tylenol up to 1000 mg 3 times daily, and ibuprofen up to 600 mg 3 times daily with food.  You can take these together, or alternate every 3-4 hours. -If symptoms persist in 5-7 days, follow-up with an orthopedist. I recommend [facility] at [address] [place], [place] [address]. You can schedule an appointment by calling [phone]) or online ([email or web address]), but they also have a walk-in clinic M-F 8a-8p and Sat 10a-3p.

## 2021-09-30 ENCOUNTER — Encounter (HOSPITAL_COMMUNITY): Payer: Self-pay | Admitting: Emergency Medicine

## 2021-09-30 ENCOUNTER — Ambulatory Visit (HOSPITAL_COMMUNITY)
Admission: EM | Admit: 2021-09-30 | Discharge: 2021-09-30 | Disposition: A | Discharge status: Home / Self Care | Payer: Medicaid Other | Attending: Nurse Practitioner | Admitting: Nurse Practitioner

## 2021-09-30 ENCOUNTER — Other Ambulatory Visit: Payer: Self-pay

## 2021-09-30 DIAGNOSIS — K0889 Other specified disorders of teeth and supporting structures: Secondary | ICD-10-CM | POA: Diagnosis not present

## 2021-09-30 MED ORDER — IBUPROFEN 800 MG PO TABS: 800.0000 mg | ORAL_TABLET | Freq: Three times a day (TID) | ORAL | 0 refills | Status: DC | PRN

## 2021-09-30 MED ORDER — CLINDAMYCIN HCL 300 MG PO CAPS: 300.0000 mg | ORAL_CAPSULE | Freq: Three times a day (TID) | ORAL | 0 refills | Status: AC

## 2021-09-30 NOTE — ED Triage Notes (Signed)
Pt reports left side dental pain x 3 days. Pt states she believes it is her wisdom tooth and the gums are detached from the tooth.

## 2021-09-30 NOTE — ED Provider Notes (Signed)
[place]    CSN: [id number] Arrival date & time: 09/30/21  0856      History   Chief Complaint Chief Complaint  Patient presents with   Dental Pain    HPI Brandy [name] is a 26 y.o. female.   Patient is a 26 year old female who presents for left lower dental pain x3 days.  Pain is located in the left rear molar.  Patient feels like this is her wisdom tooth that is trying to break through the gum.  Pain is aggravated with eating, swallowing, talking, chewing, and exposure to cold air.  She has pain in the left mandible and in the left neck.  She denies swelling to the face, fever, chills, tooth or trauma.  She has been using BC powder and Orajel with minimal relief.  Pain has worsened over the past 24 hours.  States that she does have a dentist that she is planning on seeing.   Dental Pain Location:  Lower Quality:  Aching Severity:  Moderate Progression:  Worsening Context: not abscess, not crown fracture, not dental caries, not dental fracture, filling intact and not recent dental surgery   Relieved by:  Nothing Worsened by:  Cold food/drink, touching, jaw movement and pressure Associated symptoms: difficulty swallowing, facial pain, gum swelling and neck pain   Associated symptoms: no drooling, no facial swelling, no fever, no neck swelling and no oral lesions    Past Medical History:  Diagnosis Date   Chlamydia    Heart murmur    Migraine     Patient Active Problem List   Diagnosis Date Noted   Encounter for initial prescription of contraceptive pills 05/02/2019   IUD check up 12/21/2018   Normal labor 11/22/2016   Rh negative state in antepartum period 04/25/2015   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 Authorizing Provider  brompheniramine-pseudoephedrine-DM 30-2-10 MG/5ML syrup Take 10 mLs by mouth 4 (four) times daily as needed (cough, congestion). 11/13/20   Abner Greenspan, Amy L, PA  ibuprofen (ADVIL) 200 MG tablet Take 400 mg by mouth as needed for headache or mild pain.    [provider]  ibuprofen (ADVIL) 600 MG tablet Take 1 tablet (600 mg total) by mouth every 8 (eight) hours as needed for cramping. Patient not taking: Reported on 02/02/2020 09/14/19   Chase Picket, MD  traMADol (ULTRAM) 50 MG tablet Take 1 tablet (50 mg total) by mouth every 6 (six) hours as needed. 07/20/21   Hazel Sams, PA-C    Family History Family History  Problem Relation Age of Onset   Asthma Mother     Social History Social History   Tobacco Use   Smoking status: Never   Smokeless tobacco: Never  Substance Use Topics   Alcohol use: No   Drug use: No  Allergies   Kiwi extract, Penicillins, and Lobster [shellfish allergy]   Review of Systems Review of Systems  Constitutional:  Positive for appetite change (inability to eat d/t dental pain). Negative for chills and fever.  HENT:  Positive for dental problem. Negative for drooling, facial swelling and mouth sores.   Musculoskeletal:  Positive for neck pain.  Skin: Negative.     Physical Exam Triage Vital Signs ED Triage Vitals  Enc Vitals Group     BP 09/30/21 0953 115/69     Pulse Rate 09/30/21 0953 63     Resp 09/30/21 0953 16     Temp 09/30/21 0953 98.2 F (36.8 C)     Temp Source 09/30/21 0953 Oral     SpO2 09/30/21 0953 100 %     Weight 09/30/21 0951 138 lb 14.2 oz (63 kg)     Height 09/30/21 0951 5\' 4"  (1.626 m)     Head Circumference --      Peak Flow --      Pain Score 09/30/21 0951 10     Pain Loc --      Pain Edu? --      Excl. in [place]? --    No data found.  Updated Vital  Signs BP 115/69 (BP Location: Left Arm)    Pulse 63    Temp 98.2 F (36.8 C) (Oral)    Resp 16    Ht 5\' 4"  (1.626 m)    Wt 138 lb 14.2 oz (63 kg)    SpO2 100%    BMI 23.84 kg/m   Visual Acuity Right Eye Distance:   Left Eye Distance:   Bilateral Distance:    Right Eye Near:   Left Eye Near:    Bilateral Near:     Physical Exam Constitutional:      General: She is not in acute distress.    Appearance: Normal appearance.  HENT:     Head: Normocephalic and atraumatic.     Nose: Nose normal.     Mouth/Throat:     Mouth: Mucous membranes are moist. No oral lesions.     Dentition: Dental tenderness and gingival swelling present.     Tongue: No lesions.     Pharynx: Oropharynx is clear. No pharyngeal swelling.  Eyes:     Pupils: Pupils are equal, round, and reactive to light.  Neurological:     Mental Status: She is alert.     UC Treatments / Results  Labs (all labs ordered are listed, but only abnormal results are displayed) Labs Reviewed - No data to display  EKG   Radiology No results found.  Procedures Procedures (including critical care time)  Medications Ordered in UC Medications - No data to display  Initial Impression / Assessment and Plan / UC Course  I have reviewed the triage vital signs and the nursing notes.  Pertinent labs & imaging results that were available during my care of the patient were reviewed by me and considered in my medical decision making (see chart for details).  Symptoms are consistent with dentalgia.  No presence of fever, chills, facial swelling, or dental caries.  Will prescribe antibiotics in the interim, patient is allergic to penicillin.  Recommend using warm salt water gargles, and warm compresses to the affected area for pain or general discomfort.  Patient urged to use Tylenol for breakthrough pain, instructions provided for dosing along with ibuprofen provided.  Patient encouraged to follow-up if symptoms worsen or do not  improve.  Final Clinical Impressions(s) / UC Diagnoses   Final diagnoses:  None   Discharge Instructions   None    ED Prescriptions   None    PDMP not reviewed this encounter.   Tish Men, NP 09/30/21 1027

## 2021-09-30 NOTE — Discharge Instructions (Addendum)
Take medication as prescribed. Warm salt water gargles or warm compresses to the affected area to help with pain. May take Tylenol 500 mg 1 tablet 1 to 2 hours after ibuprofen as needed for breakthrough pain.  Do not exceed 3000 mg in a 24-hour period. Follow-up with the dentist as soon as possible.

## 2022-02-14 ENCOUNTER — Ambulatory Visit (HOSPITAL_COMMUNITY)
Admission: EM | Admit: 2022-02-14 | Discharge: 2022-02-14 | Disposition: A | Discharge status: Home / Self Care | Payer: Medicaid Other | Attending: Internal Medicine | Admitting: Internal Medicine

## 2022-02-14 ENCOUNTER — Ambulatory Visit (INDEPENDENT_AMBULATORY_CARE_PROVIDER_SITE_OTHER): Payer: Medicaid Other

## 2022-02-14 ENCOUNTER — Encounter (HOSPITAL_COMMUNITY): Payer: Self-pay

## 2022-02-14 DIAGNOSIS — W19XXXA Unspecified fall, initial encounter: Secondary | ICD-10-CM

## 2022-02-14 DIAGNOSIS — M79604 Pain in right leg: Secondary | ICD-10-CM

## 2022-02-14 DIAGNOSIS — S79921A Unspecified injury of right thigh, initial encounter: Secondary | ICD-10-CM | POA: Diagnosis not present

## 2022-02-14 MED ORDER — IBUPROFEN 600 MG PO TABS: 600.0000 mg | ORAL_TABLET | Freq: Four times a day (QID) | ORAL | 0 refills | Status: DC | PRN

## 2022-02-14 NOTE — Discharge Instructions (Signed)
Your x-ray was normal.  I suspect that you have pulled your hamstring muscle.  Recommend rest, ice application, ibuprofen as needed.  You have been prescribed prescription ibuprofen.  Please do not take any additional ibuprofen, Advil, Aleve while taking this ibuprofen.  Follow-up with orthopedist at provided contact information for further evaluation and management.

## 2022-02-14 NOTE — ED Provider Notes (Signed)
[facility]    CSN: [id number] Arrival date & time: 02/14/22  1741      History   Chief Complaint Chief Complaint  Patient presents with   Leg Pain    HPI Brandy [name] is a 26 y.o. female.   Patient presents for right posterior leg pain that started last night after a fall.  Patient reports that she was "out having fun" and someone spilled a drink on the floor when she slipped and fell.  Denies hitting head or losing consciousness.  She is having pain in the posterior thigh and the right leg.  Patient is able to bear weight but denies any numbness or tingling.  She has taken ibuprofen with minimal improvement.   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 Authorizing Provider  ibuprofen (ADVIL) 600 MG tablet Take 1 tablet (600 mg total) by mouth every 6 (six) hours as needed for mild pain. 02/14/22  Yes Mavin Dyke, Acie Fredrickson, FNP  brompheniramine-pseudoephedrine-DM 30-2-10 MG/5ML syrup Take 10 mLs by mouth 4 (four) times daily as needed (cough, congestion). 11/13/20   Vallery Sa, Amy L, PA  traMADol (ULTRAM) 50 MG tablet Take 1 tablet (50 mg total) by mouth every 6 (six) hours as needed. 07/20/21   Rhys Martini, PA-C    Family History Family History  Problem Relation Age of Onset    Asthma Mother     Social History Social History   Tobacco Use   Smoking status: Never   Smokeless tobacco: Never  Substance Use Topics   Alcohol use: No   Drug use: No     Allergies   Kiwi extract, Penicillins, and Lobster [shellfish allergy]   Review of Systems Review of Systems Per HPI  Physical Exam Triage Vital Signs ED Triage Vitals [02/14/22 1752]  Enc Vitals Group     BP 110/70     Pulse Rate 70     Resp 16     Temp 98.3 F (36.8 C)     Temp Source Oral     SpO2 98 %     Weight      Height      Head Circumference      Peak Flow      Pain Score 9     Pain Loc      Pain Edu?      Excl. in GC?    No data found.  Updated Vital Signs  BP 110/70 (BP Location: Left Arm)   Pulse 70   Temp 98.3 F (36.8 C) (Oral)   Resp 16   SpO2 98%   Visual Acuity Right Eye Distance:   Left Eye Distance:   Bilateral Distance:    Right Eye Near:   Left Eye Near:    Bilateral Near:     Physical Exam Constitutional:      General: She is not in acute distress.    Appearance: Normal appearance. She is not toxic-appearing or diaphoretic.  HENT:     Head: Normocephalic and atraumatic.  Eyes:     Extraocular Movements: Extraocular movements intact.     Conjunctiva/sclera: Conjunctivae normal.  Pulmonary:     Effort: Pulmonary effort is normal.  Musculoskeletal:     Comments: Tenderness to palpation generalized throughout posterior right thigh.  No obvious swelling, discoloration, deformity, lacerations, abrasions noted.  Patient is able to extend knee but has minimal pain with this.  No other tenderness to palpation to lower extremity, ankle, foot.  Neurovascular intact.  Neurological:     General: No focal deficit present.     Mental Status: She is alert and oriented to person, place, and time. Mental status is at baseline.  Psychiatric:        Mood and Affect: Mood normal.        Behavior: Behavior normal.        Thought Content: Thought content normal.         Judgment: Judgment normal.      UC Treatments / Results  Labs (all labs ordered are listed, but only abnormal results are displayed) Labs Reviewed - No data to display  EKG   Radiology DG Femur Min 2 Views Right  Result Date: 02/14/2022 CLINICAL DATA:  Pain in right thigh.  Slipped last night. EXAM: RIGHT FEMUR 2 VIEWS COMPARISON:  None Available. FINDINGS: There is no evidence of fracture or other focal bone lesions. Soft tissues are unremarkable. IMPRESSION: Negative. Electronically Signed   By: Dorise Bullion III M.D.   On: 02/14/2022 18:20    Procedures Procedures (including critical care time)  Medications Ordered in UC Medications - No data to display  Initial Impression / Assessment and Plan / UC Course  I have reviewed the triage vital signs and the nursing notes.  Pertinent labs & imaging results that were available during my care of the patient were reviewed by me and considered in my medical decision making (see chart for details).     X-ray was negative for any acute bony abnormality.  Suspect muscular strain/injury of hamstring muscle versus contusion.  Low concern for tear. will treat with supportive care, ice application, rest, crutches, NSAIDs.  Patient to follow-up with orthopedist at provided contact information for further evaluation and management.  Discussed return precautions.  Patient verbalized understanding and was agreeable with plan. Final Clinical Impressions(s) / UC Diagnoses   Final diagnoses:  Right leg pain  Fall, initial encounter     Discharge Instructions      Your x-ray was normal.  I suspect that you have pulled your hamstring muscle.  Recommend rest, ice application, ibuprofen as needed.  You have been prescribed prescription ibuprofen.  Please do not take any additional ibuprofen, Advil, Aleve while taking this ibuprofen.  Follow-up with orthopedist at provided contact information for further evaluation and  management.     ED Prescriptions     Medication Sig Dispense Auth. Provider   ibuprofen (ADVIL) 600 MG tablet Take  1 tablet (600 mg total) by mouth every 6 (six) hours as needed for mild pain. 30 tablet [place], Acie Fredrickson, [place]      PDMP not reviewed this encounter.   Gustavus Bryant, [place] 02/14/22 1827

## 2022-02-14 NOTE — ED Triage Notes (Signed)
Pt states she slipped last night c/o pain to right thigh. States she took  Motrin earlier with no relief.

## 2022-03-09 ENCOUNTER — Encounter (HOSPITAL_COMMUNITY): Payer: Self-pay

## 2022-03-09 ENCOUNTER — Ambulatory Visit (HOSPITAL_COMMUNITY)
Admission: EM | Admit: 2022-03-09 | Discharge: 2022-03-09 | Disposition: A | Discharge status: Home / Self Care | Payer: Medicaid Other | Attending: Physician Assistant | Admitting: Physician Assistant

## 2022-03-09 DIAGNOSIS — R350 Frequency of micturition: Secondary | ICD-10-CM | POA: Insufficient documentation

## 2022-03-09 DIAGNOSIS — Z113 Encounter for screening for infections with a predominantly sexual mode of transmission: Secondary | ICD-10-CM | POA: Insufficient documentation

## 2022-03-09 DIAGNOSIS — N3000 Acute cystitis without hematuria: Secondary | ICD-10-CM | POA: Diagnosis not present

## 2022-03-09 LAB — POCT URINALYSIS DIPSTICK, ED / UC
Bilirubin Urine: NEGATIVE
Glucose, UA: NEGATIVE mg/dL
Hgb urine dipstick: NEGATIVE
Ketones, ur: 40 mg/dL — AB
Nitrite: POSITIVE — AB
Protein, ur: NEGATIVE mg/dL
Specific Gravity, Urine: >=1.03 (ref 1.005–1.030)
Urobilinogen, UA: 0.2 mg/dL (ref 0.0–1.0)
pH: 5.5 (ref 5.0–8.0)

## 2022-03-09 LAB — POC URINE PREG, ED: Preg Test, Ur: NEGATIVE

## 2022-03-09 LAB — HIV ANTIBODY (ROUTINE TESTING W REFLEX): HIV Screen 4th Generation wRfx: NONREACTIVE

## 2022-03-09 MED ORDER — NITROFURANTOIN MONOHYD MACRO 100 MG PO CAPS: 100.0000 mg | ORAL_CAPSULE | Freq: Two times a day (BID) | ORAL | 0 refills | Status: DC

## 2022-03-09 NOTE — Discharge Instructions (Signed)
Your urine appears that you have an infection.  Start Macrobid twice daily for 5 days.  If you need to change antibiotics we will contact you.  Monitor your MyChart for these results.  Make sure you rest and drink plenty fluid.  If you have any worsening symptoms including abdominal pain, pelvic pain, fever, nausea, vomiting need to be seen immediately.  We will contact you with your results if anything is positive.  Please abstain from sex until you receive your results.  You should use a condom with each sexual encounter.  If you have any worsening symptoms return for reevaluation.

## 2022-03-09 NOTE — ED Triage Notes (Signed)
Pt presents to office for STD testing. She reports some vaginal discharge.

## 2022-03-09 NOTE — ED Provider Notes (Signed)
[facility]    CSN: [id number] Arrival date & time: 03/09/22  1621      History   Chief Complaint Chief Complaint  Patient presents with   Vaginal Discharge    HPI Brandy [name] is a 26 y.o. female.   Patient presents today with a several day history of "not feeling right".  Reports that this began after she had sexual intercourse.  She does not believe that she is pregnant but is interested in testing.  She reports some typical vaginal discharge but denies any significant odor.  She has no specific concern for STI but is interested in complete panel.  She is sexually active with female partners but does not consistently use condoms.  She denies any abdominal pain, pelvic pain, fever, nausea, vomiting.  Denies any recent antibiotic use.  Denies any changes to personal hygiene products including soaps or detergents.    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 Authorizing Provider  nitrofurantoin, macrocrystal-monohydrate, (MACROBID) 100 MG capsule Take 1 capsule (100 mg total) by mouth 2 (two) times daily. 03/09/22  Yes Reshaun Briseno K, PA-C  brompheniramine-pseudoephedrine-DM 30-2-10 MG/5ML syrup Take 10 mLs by mouth 4 (four) times daily as needed (cough, congestion). 11/13/20   Vallery Sa, Amy L,  PA  ibuprofen (ADVIL) 600 MG tablet Take 1 tablet (600 mg total) by mouth every 6 (six) hours as needed for mild pain. 02/14/22   Gustavus Bryant, FNP  traMADol (ULTRAM) 50 MG tablet Take 1 tablet (50 mg total) by mouth every 6 (six) hours as needed. 07/20/21   Rhys Martini, PA-C    Family History Family History  Problem Relation Age of Onset   Asthma Mother     Social History Social History   Tobacco Use   Smoking status: Never   Smokeless tobacco: Never  Substance Use Topics   Alcohol use: No   Drug use: No     Allergies   Kiwi extract, Penicillins, and Lobster [shellfish allergy]   Review of Systems Review of Systems  Constitutional:  Positive for activity change. Negative for appetite change, fatigue and fever.  Respiratory:  Negative for cough and shortness of breath.   Cardiovascular:  Negative for chest pain.  Gastrointestinal:  Negative for abdominal pain, diarrhea, nausea and vomiting.  Genitourinary:  Positive for frequency and vaginal discharge. Negative for dysuria, urgency, vaginal bleeding and vaginal pain.     Physical Exam Triage Vital Signs ED Triage  Vitals [03/09/22 1659]  Enc Vitals Group     BP 115/79     Pulse Rate 98     Resp 18     Temp 98.4 F (36.9 C)     Temp Source Oral     SpO2 98 %     Weight      Height      Head Circumference      Peak Flow      Pain Score      Pain Loc      Pain Edu?      Excl. in GC?    No data found.  Updated Vital Signs BP 115/79 (BP Location: Left Arm)   Pulse 98   Temp 98.4 F (36.9 C) (Oral)   Resp 18   SpO2 98%   Visual Acuity Right Eye Distance:   Left Eye Distance:   Bilateral Distance:    Right Eye Near:   Left Eye Near:    Bilateral Near:     Physical Exam Vitals reviewed.  Constitutional:      General: She is awake. She is not in acute distress.    Appearance: Normal appearance. She is well-developed. She is not ill-appearing.     Comments: Very pleasant female appears stated  age in no acute distress sitting comfortably in exam room  HENT:     Head: Normocephalic and atraumatic.  Cardiovascular:     Rate and Rhythm: Normal rate and regular rhythm.     Heart sounds: Normal heart sounds, S1 normal and S2 normal. No murmur heard. Pulmonary:     Effort: Pulmonary effort is normal.     Breath sounds: Normal breath sounds. No wheezing, rhonchi or rales.     Comments: Clear to auscultation bilaterally Abdominal:     General: Bowel sounds are normal.     Palpations: Abdomen is soft.     Tenderness: There is no abdominal tenderness. There is no right CVA tenderness, left CVA tenderness, guarding or rebound.     Comments: Benign abdominal exam  Genitourinary:    Comments: Exam deferred Psychiatric:        Behavior: Behavior is cooperative.      UC Treatments / Results  Labs (all labs ordered are listed, but only abnormal results are displayed) Labs Reviewed  POCT URINALYSIS DIPSTICK, ED / UC - Abnormal; Notable for the following components:      Result Value   Ketones, ur 40 (*)    Nitrite POSITIVE (*)    Leukocytes,Ua SMALL (*)    All other components within normal limits  URINE CULTURE  HIV ANTIBODY (ROUTINE TESTING W REFLEX)  RPR  POC URINE PREG, ED  CERVICOVAGINAL ANCILLARY ONLY    EKG   Radiology No results found.  Procedures Procedures (including critical care time)  Medications Ordered in UC Medications - No data to display  Initial Impression / Assessment and Plan / UC Course  I have reviewed the triage vital signs and the nursing notes.  Pertinent labs & imaging results that were available during my care of the patient were reviewed by me and considered in my medical decision making (see chart for details).     Vital signs and physical exam are reassuring today; no indication for emergent evaluation or imaging.  Patient is well-appearing, afebrile, nontoxic, nontachycardic.  Urine pregnancy was negative.  UA did show evidence of  infection with positive nitrates.  Will cover with Macrobid twice daily for 5 days.  Urine culture  was obtained and we discussed that she may need to change her antibiotic based on sensitivities identified on culture.  STI swab collected today-results pending.  We will defer treatment until results are available.  She was encouraged to monitor her MyChart for these results we will contact her if anything is positive.  Discussed that she should drink plenty fluid and rest.  If anything worsens and she develops severe abdominal pain, fever, pelvic pain, nausea/vomiting she needs to be seen immediately.  Strict return precautions given.  Work excuse note provided.  Final Clinical Impressions(s) / UC Diagnoses   Final diagnoses:  Routine screening for STI (sexually transmitted infection)  Urinary frequency  Acute cystitis without hematuria     Discharge Instructions      Your urine appears that you have an infection.  Start Macrobid twice daily for 5 days.  If you need to change antibiotics we will contact you.  Monitor your MyChart for these results.  Make sure you rest and drink plenty fluid.  If you have any worsening symptoms including abdominal pain, pelvic pain, fever, nausea, vomiting need to be seen immediately.  We will contact you with your results if anything is positive.  Please abstain from sex until you receive your results.  You should use a condom with each sexual encounter.  If you have any worsening symptoms return for reevaluation.     ED Prescriptions     Medication Sig Dispense Auth. Provider   nitrofurantoin, macrocrystal-monohydrate, (MACROBID) 100 MG capsule Take 1 capsule (100 mg total) by mouth 2 (two) times daily. 10 capsule Alexy Bringle, Noberto Retort, PA-C      PDMP not reviewed this encounter.   Jeani Hawking, PA-C 03/09/22 1800

## 2022-03-10 LAB — CERVICOVAGINAL ANCILLARY ONLY
Bacterial Vaginitis (gardnerella): POSITIVE — AB
Candida Glabrata: NEGATIVE
Candida Vaginitis: NEGATIVE
Chlamydia: NEGATIVE
Comment: NEGATIVE
Comment: NEGATIVE
Comment: NEGATIVE
Comment: NEGATIVE
Comment: NEGATIVE
Comment: NORMAL
Neisseria Gonorrhea: NEGATIVE
Trichomonas: POSITIVE — AB

## 2022-03-10 LAB — RPR: RPR Ser Ql: NONREACTIVE

## 2022-03-11 ENCOUNTER — Telehealth (HOSPITAL_COMMUNITY): Payer: Self-pay | Admitting: Emergency Medicine

## 2022-03-11 ENCOUNTER — Ambulatory Visit (HOSPITAL_COMMUNITY)
Admission: EM | Admit: 2022-03-11 | Discharge: 2022-03-11 | Disposition: A | Discharge status: Home / Self Care | Payer: Medicaid Other

## 2022-03-11 LAB — URINE CULTURE: Culture: >=100000 — AB

## 2022-03-11 MED ORDER — METRONIDAZOLE 500 MG PO TABS: 500.0000 mg | ORAL_TABLET | Freq: Two times a day (BID) | ORAL | 0 refills | Status: DC

## 2022-03-11 NOTE — Addendum Note (Signed)
Addended by: Tilford Pillar on: 03/11/2022 05:00 PM   Modules accepted: Orders

## 2022-03-11 NOTE — ED Notes (Signed)
Patient presenting for STI treatment. Patient informed that oral medications have been sent into her pharmacy to treat. Patient states medications were sent to the wrong pharmacy. Updated Patient's pharmacy in chart and re-sent medications.  Patient verbalized understanding.   No injection needed in office today.

## 2022-05-14 ENCOUNTER — Other Ambulatory Visit: Payer: Self-pay

## 2022-05-14 ENCOUNTER — Ambulatory Visit (HOSPITAL_COMMUNITY)
Admission: EM | Admit: 2022-05-14 | Discharge: 2022-05-14 | Disposition: A | Discharge status: Home / Self Care | Payer: Medicaid Other

## 2022-05-14 NOTE — ED Triage Notes (Addendum)
Not belonging to this record

## 2022-05-14 NOTE — ED Notes (Signed)
Pt decided to leave 

## 2022-06-15 ENCOUNTER — Encounter (HOSPITAL_COMMUNITY): Payer: Self-pay | Admitting: Emergency Medicine

## 2022-06-15 ENCOUNTER — Ambulatory Visit (HOSPITAL_COMMUNITY)
Admission: EM | Admit: 2022-06-15 | Discharge: 2022-06-15 | Disposition: A | Discharge status: Home / Self Care | Payer: Medicaid Other | Attending: Family Medicine | Admitting: Family Medicine

## 2022-06-15 ENCOUNTER — Ambulatory Visit (INDEPENDENT_AMBULATORY_CARE_PROVIDER_SITE_OTHER): Payer: Medicaid Other

## 2022-06-15 DIAGNOSIS — M79641 Pain in right hand: Secondary | ICD-10-CM

## 2022-06-15 DIAGNOSIS — W2209XA Striking against other stationary object, initial encounter: Secondary | ICD-10-CM

## 2022-06-15 DIAGNOSIS — M7989 Other specified soft tissue disorders: Secondary | ICD-10-CM | POA: Diagnosis not present

## 2022-06-15 MED ORDER — KETOROLAC TROMETHAMINE 30 MG/ML IJ SOLN
30.0000 mg | Freq: Once | INTRAMUSCULAR | Status: AC
  Administered 2022-06-15: 30 mg via INTRAMUSCULAR

## 2022-06-15 MED ORDER — NAPROXEN 500 MG PO TABS: 500.0000 mg | ORAL_TABLET | Freq: Two times a day (BID) | ORAL | 0 refills | Status: DC | PRN

## 2022-06-15 MED ORDER — KETOROLAC TROMETHAMINE 30 MG/ML IJ SOLN
INTRAMUSCULAR | Status: AC
  Filled 2022-06-15: qty 1

## 2022-06-15 NOTE — ED Triage Notes (Signed)
Pt reports right hand pain after punching a wall last night. States pain is primarily located around the knuckles of last three fingers.

## 2022-06-15 NOTE — ED Provider Notes (Signed)
[facility]    CSN: [id number] Arrival date & time: 06/15/22  1057      History   Chief Complaint Chief Complaint  Patient presents with   Hand Pain    HPI Brandy [name] is a 26 y.o. female.    Hand Pain    Here for right hand pain. Yesterday she hit a wall with the right hand when she was frustrated.  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 Authorizing Provider  naproxen (NAPROSYN) 500 MG tablet Take 1 tablet (500 mg total) by mouth 2 (two) times daily as needed (pain). 06/15/22  Yes Zenia Resides, MD    Family History Family History  Problem Relation Age of Onset   Asthma Mother     Social History Social History   Tobacco Use   Smoking status: Never   Smokeless tobacco: Never  Substance Use Topics   Alcohol use: No   Drug use: No     Allergies   Kiwi extract, Penicillins, and Lobster [shellfish allergy]   Review of Systems Review of Systems   Physical Exam Triage Vital Signs ED Triage Vitals [06/15/22 1233]  Enc Vitals Group     BP 103/63     Pulse  Rate 60     Resp 18     Temp 98.3 F (36.8 C)     Temp Source Oral     SpO2 97 %     Weight      Height      Head Circumference      Peak Flow      Pain Score 8     Pain Loc      Pain Edu?      Excl. in GC?    No data found.  Updated Vital Signs BP 103/63 (BP Location: Right Arm)   Pulse 60   Temp 98.3 F (36.8 C) (Oral)   Resp 18   SpO2 97%   Visual Acuity Right Eye Distance:   Left Eye Distance:   Bilateral Distance:    Right Eye Near:   Left Eye Near:    Bilateral Near:     Physical Exam Vitals reviewed.  Constitutional:      General: She is  not in acute distress.    Appearance: She is not ill-appearing, toxic-appearing or diaphoretic.  Musculoskeletal:     Comments: There is swelling around her third and fourth metacarpal phalangeal joints.  It is also tender there and she has a little swelling and tenderness onto the proximal phalanx of the right middle finger.  She can slightly flex and extend all fingers.  Capillary refill is normal  Neurological:     Mental Status: She is alert and oriented to person, place, and time.  Psychiatric:        Behavior: Behavior normal.      UC Treatments / Results  Labs (all labs ordered are listed, but only abnormal results are displayed) Labs Reviewed - No data to display  EKG   Radiology DG Hand Complete Right  Result Date: 06/15/2022 CLINICAL DATA:  Third and fourth MCP joint pain after punching wall EXAM: RIGHT HAND - COMPLETE 3+ VIEW COMPARISON:  07/20/2021 FINDINGS: There is no evidence of fracture or dislocation. There is no evidence of arthropathy. Chronic bony irregularity of the distal tuft of the small finger. Soft tissue swelling at the dorsum of the hand at the MCP joint level. IMPRESSION: 1. No acute fracture or dislocation. 2. Soft tissue swelling at the dorsum of the hand at the MCP joint level. Electronically Signed   By: Duanne Guess D.O.   On: 06/15/2022 13:05    Procedures Procedures  (including critical care time)  Medications Ordered in UC Medications  ketorolac (TORADOL) 30 MG/ML injection 30 mg (has no administration in time range)    Initial Impression / Assessment and Plan / UC Course  I have reviewed the triage vital signs and the nursing notes.  Pertinent labs & imaging results that were available during my care of the patient were reviewed by me and considered in my medical decision making (see chart for details).        X-ray does not show any fractures.  Ace wrap is applied and she is given a shot of Toradol.  Naproxen is sent for pain.  Ice and elevate Final Clinical Impressions(s) / UC Diagnoses   Final diagnoses:  Right hand pain     Discharge Instructions      The x-rays did not show any broken bones  You have been given a shot of Toradol 30 mg today.  Take naproxen 500 mg--1 tablet every 12 hours as needed for pain  Ice and elevate your hand today and tomorrow     ED Prescriptions     Medication Sig Dispense Auth. Provider   naproxen (NAPROSYN) 500 MG tablet Take 1 tablet (500 mg total) by mouth 2 (two) times daily as needed (pain). 30 tablet Dhani Dannemiller, Janace Aris, MD      PDMP not reviewed this encounter.   Zenia Resides, MD 06/15/22 1320

## 2022-06-15 NOTE — Discharge Instructions (Signed)
The x-rays did not show any broken bones  You have been given a shot of Toradol 30 mg today.  Take naproxen 500 mg--1 tablet every 12 hours as needed for pain  Ice and elevate your hand today and tomorrow

## 2022-08-29 ENCOUNTER — Other Ambulatory Visit: Payer: Self-pay

## 2022-08-29 ENCOUNTER — Ambulatory Visit (HOSPITAL_COMMUNITY)
Admission: RE | Admit: 2022-08-29 | Discharge: 2022-08-29 | Disposition: A | Discharge status: Home / Self Care | Payer: Medicaid Other | Source: Ambulatory Visit | Attending: Physician Assistant | Admitting: Physician Assistant

## 2022-08-29 ENCOUNTER — Encounter (HOSPITAL_COMMUNITY): Payer: Self-pay

## 2022-08-29 VITALS — BP 109/68 | HR 87 | Temp 98.3°F | Resp 16

## 2022-08-29 DIAGNOSIS — Z113 Encounter for screening for infections with a predominantly sexual mode of transmission: Secondary | ICD-10-CM | POA: Diagnosis not present

## 2022-08-29 DIAGNOSIS — N3000 Acute cystitis without hematuria: Secondary | ICD-10-CM | POA: Insufficient documentation

## 2022-08-29 LAB — POCT URINALYSIS DIPSTICK, ED / UC
Bilirubin Urine: NEGATIVE
Glucose, UA: NEGATIVE mg/dL
Ketones, ur: NEGATIVE mg/dL
Nitrite: POSITIVE — AB
Protein, ur: 30 mg/dL — AB
Specific Gravity, Urine: 1.025 (ref 1.005–1.030)
Urobilinogen, UA: 0.2 mg/dL (ref 0.0–1.0)
pH: 5.5 (ref 5.0–8.0)

## 2022-08-29 LAB — POC URINE PREG, ED: Preg Test, Ur: NEGATIVE

## 2022-08-29 LAB — HIV ANTIBODY (ROUTINE TESTING W REFLEX): HIV Screen 4th Generation wRfx: NONREACTIVE

## 2022-08-29 MED ORDER — NITROFURANTOIN MONOHYD MACRO 100 MG PO CAPS: 100.0000 mg | ORAL_CAPSULE | Freq: Two times a day (BID) | ORAL | 0 refills | Status: AC

## 2022-08-29 NOTE — Discharge Instructions (Addendum)
Will treat for urinary tract infection, will call with urine culture results and change treatment plans if indicated.  Will call with test results and treat if indicated. Abstain from sexual activity until test results are back and any necessary treatment has been completed

## 2022-08-29 NOTE — ED Provider Notes (Addendum)
[facility]    CSN: [id number] Arrival date & time: 08/29/22  1259      History   Chief Complaint Chief Complaint  Patient presents with   Appointment    1:00   SEXUALLY TRANSMITTED DISEASE    HPI Brandy [name] is a 27 y.o. female.   Patient presents for STD testing.  She reports her partner recently cheated with another partner.  She is currently asymptomatic.    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 Authorizing Provider  nitrofurantoin, macrocrystal-monohydrate, (MACROBID) 100 MG capsule Take 1 capsule (100 mg total) by mouth 2 (two) times daily for 5 days. 08/29/22 09/03/22 Yes Ward, Lenise Arena, PA-C  naproxen (NAPROSYN) 500 MG tablet Take 1 tablet (500 mg total) by mouth 2 (two) times daily as needed (pain). Patient not taking: Reported on 08/29/2022 06/15/22   Barrett Henle, MD    Family History Family History  Problem Relation Age of Onset   Asthma Mother     Social History Social History   Tobacco Use   Smoking status: Never   Smokeless tobacco: Never  Vaping Use   Vaping Use: Never used  Substance Use Topics   Alcohol use: Yes   Drug use: No     Allergies   Kiwi extract, Penicillins, and Lobster [shellfish allergy]   Review of Systems Review of  Systems  Constitutional:  Negative for chills and fever.  HENT:  Negative for ear pain and sore throat.   Eyes:  Negative for pain and visual disturbance.  Respiratory:  Negative for cough and shortness of breath.   Cardiovascular:  Negative for chest pain and palpitations.  Gastrointestinal:  Negative for abdominal pain and vomiting.  Genitourinary:  Negative for dysuria and hematuria.  Musculoskeletal:  Negative for arthralgias and back pain.  Skin:  Negative for color change and rash.  Neurological:  Negative for seizures and syncope.  All other systems reviewed and are negative.    Physical Exam Triage Vital Signs ED Triage Vitals  Enc Vitals Group     BP 08/29/22 1337 109/68     Pulse Rate 08/29/22 1337 87     Resp 08/29/22 1337 16     Temp 08/29/22 1337 98.3 F (36.8 C)     Temp Source 08/29/22 1337 Oral     SpO2 08/29/22 1337 98 %     Weight --      Height --  Head Circumference --      Peak Flow --      Pain Score 08/29/22 1335 0     Pain Loc --      Pain Edu? --      Excl. in GC? --    No data found.  Updated Vital Signs BP 109/68 (BP Location: Right Arm)   Pulse 87   Temp 98.3 F (36.8 C) (Oral)   Resp 16   LMP 08/28/2022   SpO2 98%   Visual Acuity Right Eye Distance:   Left Eye Distance:   Bilateral Distance:    Right Eye Near:   Left Eye Near:    Bilateral Near:     Physical Exam Vitals and nursing note reviewed.  Constitutional:      General: She is not in acute distress.    Appearance: She is well-developed.  HENT:     Head: Normocephalic and atraumatic.  Eyes:     Conjunctiva/sclera: Conjunctivae normal.  Cardiovascular:     Rate and Rhythm: Normal rate and regular rhythm.     Heart sounds: No murmur heard. Pulmonary:     Effort: Pulmonary effort is normal. No respiratory distress.     Breath sounds: Normal breath sounds.  Abdominal:     Palpations: Abdomen is soft.     Tenderness: There is no abdominal tenderness.   Musculoskeletal:        General: No swelling.     Cervical back: Neck supple.  Skin:    General: Skin is warm and dry.     Capillary Refill: Capillary refill takes less than 2 seconds.  Neurological:     Mental Status: She is alert.  Psychiatric:        Mood and Affect: Mood normal.      UC Treatments / Results  Labs (all labs ordered are listed, but only abnormal results are displayed) Labs Reviewed  POCT URINALYSIS DIPSTICK, ED / UC - Abnormal; Notable for the following components:      Result Value   Hgb urine dipstick LARGE (*)    Protein, ur 30 (*)    Nitrite POSITIVE (*)    Leukocytes,Ua TRACE (*)    All other components within normal limits  URINE CULTURE  HIV ANTIBODY (ROUTINE TESTING W REFLEX)  POC URINE PREG, ED  CERVICOVAGINAL ANCILLARY ONLY    EKG   Radiology No results found.  Procedures Procedures (including critical care time)  Medications Ordered in UC Medications - No data to display  Initial Impression / Assessment and Plan / UC Course  I have reviewed the triage vital signs and the nursing notes.  Pertinent labs & imaging results that were available during my care of the patient were reviewed by me and considered in my medical decision making (see chart for details).     STD screening.  Patient asymptomatic at this time.  Will change treatment plan based on test results if indicated. Will treat for UTI, urine culture sent. Safe sex practices discussed.  Return precautions discussed. Final Clinical Impressions(s) / UC Diagnoses   Final diagnoses:  Screening examination for STD (sexually transmitted disease)  Acute cystitis without hematuria     Discharge Instructions      Will treat for urinary tract infection, will call with urine culture results and change treatment plans if indicated.  Will call with test results and treat if indicated. Abstain from sexual activity until test results are back and any necessary treatment has been  completed  ED Prescriptions     Medication Sig Dispense Auth. Provider   nitrofurantoin, macrocrystal-monohydrate, (MACROBID) 100 MG capsule Take 1 capsule (100 mg total) by mouth 2 (two) times daily for 5 days. 10 capsule Ward, Lenise Arena, PA-C      PDMP not reviewed this encounter.   Ward, Lenise Arena, PA-C 08/29/22 1424    Ward, Lenise Arena, PA-C 08/29/22 1451

## 2022-08-29 NOTE — ED Triage Notes (Signed)
Patient is requesting std evaluation.  Denies any symptoms.  Patient knows a recent partner was cheating

## 2022-08-31 ENCOUNTER — Telehealth (HOSPITAL_COMMUNITY): Payer: Self-pay | Admitting: Emergency Medicine

## 2022-08-31 LAB — URINE CULTURE: Culture: >=100000 — AB

## 2022-08-31 LAB — CERVICOVAGINAL ANCILLARY ONLY
Bacterial Vaginitis (gardnerella): POSITIVE — AB
Candida Glabrata: NEGATIVE
Candida Vaginitis: NEGATIVE
Chlamydia: POSITIVE — AB
Comment: NEGATIVE
Comment: NEGATIVE
Comment: NEGATIVE
Comment: NEGATIVE
Comment: NEGATIVE
Comment: NORMAL
Neisseria Gonorrhea: NEGATIVE
Trichomonas: NEGATIVE

## 2022-08-31 MED ORDER — METRONIDAZOLE 0.75 % VA GEL: 1.0000 | Freq: Every day | VAGINAL | 0 refills | Status: AC

## 2022-08-31 MED ORDER — DOXYCYCLINE HYCLATE 100 MG PO CAPS
100.0000 mg | ORAL_CAPSULE | Freq: Two times a day (BID) | ORAL | 0 refills | Status: AC
Start: 2022-08-31 — End: 2022-09-07

## 2023-01-27 ENCOUNTER — Emergency Department (HOSPITAL_COMMUNITY): Payer: Medicaid Other

## 2023-01-27 ENCOUNTER — Emergency Department (HOSPITAL_COMMUNITY)
Admission: EM | Admit: 2023-01-27 | Discharge: 2023-01-27 | Disposition: A | Discharge status: Home / Self Care | Payer: Medicaid Other | Attending: Emergency Medicine | Admitting: Emergency Medicine

## 2023-01-27 ENCOUNTER — Other Ambulatory Visit: Payer: Self-pay

## 2023-01-27 ENCOUNTER — Encounter (HOSPITAL_COMMUNITY): Payer: Self-pay

## 2023-01-27 DIAGNOSIS — R102 Pelvic and perineal pain: Secondary | ICD-10-CM | POA: Diagnosis not present

## 2023-01-27 DIAGNOSIS — R1032 Left lower quadrant pain: Secondary | ICD-10-CM | POA: Insufficient documentation

## 2023-01-27 DIAGNOSIS — R140 Abdominal distension (gaseous): Secondary | ICD-10-CM | POA: Diagnosis not present

## 2023-01-27 DIAGNOSIS — N939 Abnormal uterine and vaginal bleeding, unspecified: Secondary | ICD-10-CM | POA: Insufficient documentation

## 2023-01-27 LAB — COMPREHENSIVE METABOLIC PANEL
ALT: 15 U/L (ref 0–44)
AST: 19 U/L (ref 15–41)
Albumin: 4.4 g/dL (ref 3.5–5.0)
Alkaline Phosphatase: 66 U/L (ref 38–126)
Anion gap: 6 (ref 5–15)
BUN: 12 mg/dL (ref 6–20)
CO2: 25 mmol/L (ref 22–32)
Calcium: 9 mg/dL (ref 8.9–10.3)
Chloride: 106 mmol/L (ref 98–111)
Creatinine, Ser: 0.8 mg/dL (ref 0.44–1.00)
GFR, Estimated: >60 mL/min (ref >60)
Glucose, Bld: 104 mg/dL — ABNORMAL HIGH (ref 70–99)
Potassium: 3.5 mmol/L (ref 3.5–5.1)
Sodium: 137 mmol/L (ref 135–145)
Total Bilirubin: 0.7 mg/dL (ref 0.3–1.2)
Total Protein: 7.9 g/dL (ref 6.5–8.1)

## 2023-01-27 LAB — CBC WITH DIFFERENTIAL/PLATELET
Abs Immature Granulocytes: 0 10*3/uL (ref 0.00–0.07)
Basophils Absolute: 0 10*3/uL (ref 0.0–0.1)
Basophils Relative: 2 %
Eosinophils Absolute: 0.1 10*3/uL (ref 0.0–0.5)
Eosinophils Relative: 5 %
HCT: 40.9 % (ref 36.0–46.0)
Hemoglobin: 13.3 g/dL (ref 12.0–15.0)
Immature Granulocytes: 0 %
Lymphocytes Relative: 64 %
Lymphs Abs: 1.8 10*3/uL (ref 0.7–4.0)
MCH: 32.8 pg (ref 26.0–34.0)
MCHC: 32.5 g/dL (ref 30.0–36.0)
MCV: 101 fL — ABNORMAL HIGH (ref 80.0–100.0)
Monocytes Absolute: 0.2 10*3/uL (ref 0.1–1.0)
Monocytes Relative: 8 %
Neutro Abs: 0.6 10*3/uL — ABNORMAL LOW (ref 1.7–7.7)
Neutrophils Relative %: 21 %
Platelets: 283 10*3/uL (ref 150–400)
RBC: 4.05 MIL/uL (ref 3.87–5.11)
RDW: 11.8 % (ref 11.5–15.5)
WBC: 2.7 10*3/uL — ABNORMAL LOW (ref 4.0–10.5)
nRBC: 0 % (ref 0.0–0.2)

## 2023-01-27 LAB — PREGNANCY, URINE: Preg Test, Ur: NEGATIVE

## 2023-01-27 LAB — URINALYSIS, ROUTINE W REFLEX MICROSCOPIC
Bilirubin Urine: NEGATIVE
Glucose, UA: NEGATIVE mg/dL
Ketones, ur: NEGATIVE mg/dL
Leukocytes,Ua: NEGATIVE
Nitrite: NEGATIVE
Protein, ur: NEGATIVE mg/dL
Specific Gravity, Urine: 1.019 (ref 1.005–1.030)
pH: 6 (ref 5.0–8.0)

## 2023-01-27 LAB — LIPASE, BLOOD: Lipase: 34 U/L (ref 11–51)

## 2023-01-27 LAB — HCG, QUANTITATIVE, PREGNANCY: hCG, Beta Chain, Quant, S: <1 m[IU]/mL (ref <5)

## 2023-01-27 MED ORDER — MORPHINE SULFATE (PF) 4 MG/ML IV SOLN
4.0000 mg | Freq: Once | INTRAVENOUS | Status: AC
  Administered 2023-01-27: 4 mg via INTRAVENOUS
  Filled 2023-01-27: qty 1

## 2023-01-27 MED ORDER — ONDANSETRON HCL 4 MG/2ML IJ SOLN
4.0000 mg | Freq: Once | INTRAMUSCULAR | Status: AC
  Administered 2023-01-27: 4 mg via INTRAVENOUS
  Filled 2023-01-27: qty 2

## 2023-01-27 NOTE — ED Triage Notes (Signed)
Pt states she was 5 days late with her menstrual cycle, had some light pink bleeding this morning. Pt states she has generalized abdominal pain and has experienced a distended abdomen intermittently.

## 2023-01-27 NOTE — Discharge Instructions (Addendum)
Evaluation was overall reassuring today.  Recommend you follow-up with OB/GYN for your vaginal spotting.  Ultrasound was negative.  If you have worsening abdominal pain, nausea vomiting diarrhea, worsening vaginal bleeding or abnormal discharge or any other concern please return emerged part for further evaluation.

## 2023-01-27 NOTE — ED Provider Notes (Addendum)
[facility] AT [facility] Provider Note   CSN: [id number] Arrival date & time: 01/27/23  1129     History  Chief Complaint  Patient presents with   Abdominal Pain   HPI Brandy [name] is a 27 y.o. female presenting for vaginal spotting.  States she noticed some light pink spotting this morning.  Endorses left lower quadrant abdominal pain which started 2 days ago.  Feels sharp and nonradiating.  States she does have intermittent distended abdomen but today it is soft and nondistended.  Denies fever.  States that she is 5 days late on her menstrual cycle which is unusual for her.  Not currently using any form of birth control.  Denies dysuria.  Denies nausea vomiting diarrhea.   Abdominal Pain      Home Medications Prior to Admission medications   Medication Sig Start Date End Date Taking? Authorizing Provider  naproxen (NAPROSYN) 500 MG tablet Take 1 tablet (500 mg total) by mouth 2 (two) times daily as needed (pain). Patient not taking: Reported on 08/29/2022 06/15/22   Zenia Resides, MD      Allergies    Kiwi extract, Penicillins, and Lobster [shellfish allergy]    Review of Systems   Review of Systems  Gastrointestinal:  Positive for abdominal pain.    Physical Exam Updated Vital Signs BP 114/70   Pulse 60   Temp 98 F (36.7 C) (Oral)   Resp 16   Ht 5\' 4"  (1.626 m)   Wt 63.5 kg   SpO2 100%   BMI 24.03 kg/m  Physical Exam Vitals and nursing note reviewed.  HENT:     Head: Normocephalic and atraumatic.     Mouth/Throat:     Mouth: Mucous membranes are moist.  Eyes:     General:        Right eye: No discharge.        Left eye: No discharge.     Conjunctiva/sclera: Conjunctivae normal.  Cardiovascular:     Rate and Rhythm: Normal rate and regular rhythm.     Pulses: Normal pulses.     Heart sounds: Normal heart sounds.  Pulmonary:     Effort: Pulmonary effort is normal.     Breath sounds: Normal breath sounds.   Abdominal:     General: Abdomen is flat.     Palpations: Abdomen is soft.     Tenderness: There is abdominal tenderness in the left lower quadrant.  Skin:    General: Skin is warm and dry.  Neurological:     General: No focal deficit present.  Psychiatric:        Mood and Affect: Mood normal.     ED Results / Procedures / Treatments   Labs (all labs ordered are listed, but only abnormal results are displayed) Labs Reviewed  CBC WITH DIFFERENTIAL/PLATELET - Abnormal; Notable for the following components:      Result Value   WBC 2.7 (*)    MCV 101.0 (*)    Neutro Abs 0.6 (*)    All other components within normal limits  COMPREHENSIVE METABOLIC PANEL - Abnormal; Notable for the following components:   Glucose, Bld 104 (*)    All other components within normal limits  URINALYSIS, ROUTINE W REFLEX MICROSCOPIC - Abnormal; Notable for the following components:   Hgb urine dipstick MODERATE (*)    Bacteria, UA RARE (*)    All other components within normal limits  LIPASE, BLOOD  PREGNANCY, URINE  HCG, QUANTITATIVE,  PREGNANCY  PATHOLOGIST SMEAR REVIEW    EKG None  Radiology US Pelvis Complete  Result Date: 01/27/2023 CLINICAL DATA:  Pelvic pain and bloating. EXAM: TRANSABDOMINAL AND TRANSVAGINAL ULTRASOUND OF PELVIS DOPPLER ULTRASOUND OF OVARIES TECHNIQUE: Both transabdominal and transvaginal ultrasound examinations of the pelvis were performed. Transabdominal technique was performed for global imaging of the pelvis including uterus, ovaries, adnexal regions, and pelvic cul-de-sac. It was necessary to proceed with endovaginal exam following the transabdominal exam to visualize the adnexa. Color and duplex Doppler ultrasound was utilized to evaluate blood flow to the ovaries. COMPARISON:  None Available. FINDINGS: Uterus Measurements: 8.2 x 3.8 x 5.0 cm = volume: 81.6 mL. No fibroids or other mass visualized. Endometrium Thickness: 2.6 mm.  No focal abnormality visualized. Right  ovary Measurements: 3.4 x 2.9 x 2.6 cm = volume: 13.5 mL. Normal appearance/no adnexal mass. Left ovary Measurements: 3.1 x 2.0 x 2.2 cm = volume: 7.0 mL. Normal appearance/no adnexal mass. Pulsed Doppler evaluation of both ovaries demonstrates normal low-resistance arterial and venous waveforms. Other findings Small amount of free fluid in the pelvis. IMPRESSION: Normal pelvic ultrasound. Small amount of free fluid in the pelvis, possibly physiologic. Electronically Signed   By: Ted Mcalpine M.D.   On: 01/27/2023 14:43   US Transvaginal Non-OB  Result Date: 01/27/2023 CLINICAL DATA:  Pelvic pain and bloating. EXAM: TRANSABDOMINAL AND TRANSVAGINAL ULTRASOUND OF PELVIS DOPPLER ULTRASOUND OF OVARIES TECHNIQUE: Both transabdominal and transvaginal ultrasound examinations of the pelvis were performed. Transabdominal technique was performed for global imaging of the pelvis including uterus, ovaries, adnexal regions, and pelvic cul-de-sac. It was necessary to proceed with endovaginal exam following the transabdominal exam to visualize the adnexa. Color and duplex Doppler ultrasound was utilized to evaluate blood flow to the ovaries. COMPARISON:  None Available. FINDINGS: Uterus Measurements: 8.2 x 3.8 x 5.0 cm = volume: 81.6 mL. No fibroids or other mass visualized. Endometrium Thickness: 2.6 mm.  No focal abnormality visualized. Right ovary Measurements: 3.4 x 2.9 x 2.6 cm = volume: 13.5 mL. Normal appearance/no adnexal mass. Left ovary Measurements: 3.1 x 2.0 x 2.2 cm = volume: 7.0 mL. Normal appearance/no adnexal mass. Pulsed Doppler evaluation of both ovaries demonstrates normal low-resistance arterial and venous waveforms. Other findings Small amount of free fluid in the pelvis. IMPRESSION: Normal pelvic ultrasound. Small amount of free fluid in the pelvis, possibly physiologic. Electronically Signed   By: Ted Mcalpine M.D.   On: 01/27/2023 14:43   [place] Art/Ven Flow Abd Pelv Doppler  Result Date:  01/27/2023 CLINICAL DATA:  Pelvic pain and bloating. EXAM: TRANSABDOMINAL AND TRANSVAGINAL ULTRASOUND OF PELVIS DOPPLER ULTRASOUND OF OVARIES TECHNIQUE: Both transabdominal and transvaginal ultrasound examinations of the pelvis were performed. Transabdominal technique was performed for global imaging of the pelvis including uterus, ovaries, adnexal regions, and pelvic cul-de-sac. It was necessary to proceed with endovaginal exam following the transabdominal exam to visualize the adnexa. Color and duplex Doppler ultrasound was utilized to evaluate blood flow to the ovaries. COMPARISON:  None Available. FINDINGS: Uterus Measurements: 8.2 x 3.8 x 5.0 cm = volume: 81.6 mL. No fibroids or other mass visualized. Endometrium Thickness: 2.6 mm.  No focal abnormality visualized. Right ovary Measurements: 3.4 x 2.9 x 2.6 cm = volume: 13.5 mL. Normal appearance/no adnexal mass. Left ovary Measurements: 3.1 x 2.0 x 2.2 cm = volume: 7.0 mL. Normal appearance/no adnexal mass. Pulsed Doppler evaluation of both ovaries demonstrates normal low-resistance arterial and venous waveforms. Other findings Small amount of free fluid in the pelvis. IMPRESSION:  Normal pelvic ultrasound. Small amount of free fluid in the pelvis, possibly physiologic. Electronically Signed   By: Ted Mcalpine M.D.   On: 01/27/2023 14:43    Procedures Procedures    Medications Ordered in ED Medications  morphine (PF) 4 MG/ML injection 4 mg (4 mg Intravenous Given 01/27/23 1342)  ondansetron (ZOFRAN) injection 4 mg (4 mg Intravenous Given 01/27/23 1341)    ED Course/ Medical Decision Making/ A&P                             Medical Decision Making Amount and/or Complexity of Data Reviewed Labs: ordered. Radiology: ordered.  Risk Prescription drug management.   27 year old well-appearing female presenting for abdominal cramping and vaginal spotting.  Exam revealed left lower quadrant tenderness.  DDx includes ectopic pregnancy,  intrauterine pregnancy, ovarian torsion, other intra-abdominal infection.  Workup does not indicate the patient is pregnant making ectopic and intrauterine pregnancy unlikely.  Considered ovarian torsion but unlikely given negative ultrasound.  Also considered intra-abdominal infection but unlikely given no nausea vomiting diarrhea or urinary symptoms.  Advised to follow-up with her OB/GYN provider for vaginal spotting.  Discussed pertinent return precautions.  Vital signs stable.  Discharged home in good condition.    Final Clinical Impression(s) / ED Diagnoses Final diagnoses:  Vaginal spotting    Rx / DC Orders ED Discharge Orders     None         Gareth Eagle, PA-C 01/27/23 1330    Gareth Eagle, PA-C 01/27/23 1515    Gloris Manchester, MD 01/27/23 [id number]

## 2023-01-28 LAB — PATHOLOGIST SMEAR REVIEW

## 2023-02-18 DIAGNOSIS — Z131 Encounter for screening for diabetes mellitus: Secondary | ICD-10-CM | POA: Diagnosis not present

## 2023-02-18 DIAGNOSIS — Z1329 Encounter for screening for other suspected endocrine disorder: Secondary | ICD-10-CM | POA: Diagnosis not present

## 2023-02-18 DIAGNOSIS — R202 Paresthesia of skin: Secondary | ICD-10-CM | POA: Diagnosis not present

## 2023-02-18 DIAGNOSIS — R350 Frequency of micturition: Secondary | ICD-10-CM | POA: Diagnosis not present

## 2023-02-18 DIAGNOSIS — Z3009 Encounter for other general counseling and advice on contraception: Secondary | ICD-10-CM | POA: Diagnosis not present

## 2023-03-24 ENCOUNTER — Inpatient Hospital Stay (HOSPITAL_COMMUNITY)
Admission: AD | Admit: 2023-03-24 | Discharge: 2023-03-25 | Disposition: A | Discharge status: Home / Self Care | Payer: Medicaid Other | Attending: Obstetrics and Gynecology | Admitting: Obstetrics and Gynecology

## 2023-03-24 DIAGNOSIS — Z3A01 Less than 8 weeks gestation of pregnancy: Secondary | ICD-10-CM | POA: Insufficient documentation

## 2023-03-24 DIAGNOSIS — O26891 Other specified pregnancy related conditions, first trimester: Secondary | ICD-10-CM | POA: Diagnosis not present

## 2023-03-24 DIAGNOSIS — Z3A00 Weeks of gestation of pregnancy not specified: Secondary | ICD-10-CM | POA: Diagnosis not present

## 2023-03-24 DIAGNOSIS — Z3201 Encounter for pregnancy test, result positive: Secondary | ICD-10-CM | POA: Diagnosis not present

## 2023-03-24 DIAGNOSIS — R1032 Left lower quadrant pain: Secondary | ICD-10-CM | POA: Insufficient documentation

## 2023-03-24 DIAGNOSIS — R1030 Lower abdominal pain, unspecified: Secondary | ICD-10-CM

## 2023-03-24 DIAGNOSIS — O3680X0 Pregnancy with inconclusive fetal viability, not applicable or unspecified: Secondary | ICD-10-CM | POA: Diagnosis not present

## 2023-03-24 DIAGNOSIS — O219 Vomiting of pregnancy, unspecified: Secondary | ICD-10-CM | POA: Diagnosis not present

## 2023-03-24 DIAGNOSIS — N760 Acute vaginitis: Secondary | ICD-10-CM | POA: Diagnosis not present

## 2023-03-24 DIAGNOSIS — N898 Other specified noninflammatory disorders of vagina: Secondary | ICD-10-CM | POA: Diagnosis not present

## 2023-03-24 LAB — CBC
HCT: 36.1 % (ref 36.0–46.0)
Hemoglobin: 12.1 g/dL (ref 12.0–15.0)
MCH: 32.5 pg (ref 26.0–34.0)
MCHC: 33.5 g/dL (ref 30.0–36.0)
MCV: 97 fL (ref 80.0–100.0)
Platelets: 328 10*3/uL (ref 150–400)
RBC: 3.72 MIL/uL — ABNORMAL LOW (ref 3.87–5.11)
RDW: 11.5 % (ref 11.5–15.5)
WBC: 4.1 10*3/uL (ref 4.0–10.5)
nRBC: 0 % (ref 0.0–0.2)

## 2023-03-24 NOTE — MAU Note (Signed)
..  Brandy [name] is a 27 y.o. at Unknown here in MAU reporting: upper and left lower abdominal pain for 3 days, got worse today. Has vomited 3 times today and feels dehydrated.   Took tylenol at 2000, did not help.  LMP: 02/20/2023  Pain score: 7/10 Vitals:   03/24/23 2359  BP: 123/70  Pulse: 71  Resp: 18  Temp: 98.7 F (37.1 C)  SpO2: 99%     FHT:n/a Lab orders placed from triage:  UA

## 2023-03-25 ENCOUNTER — Inpatient Hospital Stay (HOSPITAL_COMMUNITY): Payer: Medicaid Other

## 2023-03-25 ENCOUNTER — Encounter (HOSPITAL_COMMUNITY): Payer: Self-pay

## 2023-03-25 DIAGNOSIS — O219 Vomiting of pregnancy, unspecified: Secondary | ICD-10-CM | POA: Diagnosis not present

## 2023-03-25 DIAGNOSIS — Z3A01 Less than 8 weeks gestation of pregnancy: Secondary | ICD-10-CM

## 2023-03-25 DIAGNOSIS — O3680X0 Pregnancy with inconclusive fetal viability, not applicable or unspecified: Secondary | ICD-10-CM | POA: Diagnosis not present

## 2023-03-25 DIAGNOSIS — R1030 Lower abdominal pain, unspecified: Secondary | ICD-10-CM

## 2023-03-25 DIAGNOSIS — O26891 Other specified pregnancy related conditions, first trimester: Secondary | ICD-10-CM | POA: Diagnosis not present

## 2023-03-25 DIAGNOSIS — Z3A00 Weeks of gestation of pregnancy not specified: Secondary | ICD-10-CM | POA: Diagnosis not present

## 2023-03-25 LAB — COMPREHENSIVE METABOLIC PANEL
ALT: 14 U/L (ref 0–44)
AST: 16 U/L (ref 15–41)
Albumin: 3.8 g/dL (ref 3.5–5.0)
Alkaline Phosphatase: 54 U/L (ref 38–126)
Anion gap: 8 (ref 5–15)
BUN: 6 mg/dL (ref 6–20)
CO2: 24 mmol/L (ref 22–32)
Calcium: 9 mg/dL (ref 8.9–10.3)
Chloride: 106 mmol/L (ref 98–111)
Creatinine, Ser: 0.78 mg/dL (ref 0.44–1.00)
GFR, Estimated: >60 mL/min (ref >60)
Glucose, Bld: 93 mg/dL (ref 70–99)
Potassium: 3.5 mmol/L (ref 3.5–5.1)
Sodium: 138 mmol/L (ref 135–145)
Total Bilirubin: 0.7 mg/dL (ref 0.3–1.2)
Total Protein: 6.8 g/dL (ref 6.5–8.1)

## 2023-03-25 LAB — URINALYSIS, ROUTINE W REFLEX MICROSCOPIC
Bilirubin Urine: NEGATIVE
Glucose, UA: NEGATIVE mg/dL
Hgb urine dipstick: NEGATIVE
Ketones, ur: NEGATIVE mg/dL
Nitrite: NEGATIVE
Protein, ur: NEGATIVE mg/dL
Specific Gravity, Urine: 1.013 (ref 1.005–1.030)
pH: 7 (ref 5.0–8.0)

## 2023-03-25 LAB — GC/CHLAMYDIA PROBE AMP (~~LOC~~) NOT AT ARMC
Chlamydia: NEGATIVE
Comment: NEGATIVE
Comment: NORMAL
Neisseria Gonorrhea: POSITIVE — AB

## 2023-03-25 LAB — WET PREP, GENITAL
Sperm: NONE SEEN
Trich, Wet Prep: NONE SEEN
WBC, Wet Prep HPF POC: <10 (ref <10)
Yeast Wet Prep HPF POC: NONE SEEN

## 2023-03-25 LAB — HCG, QUANTITATIVE, PREGNANCY: hCG, Beta Chain, Quant, S: 7721 m[IU]/mL — ABNORMAL HIGH (ref <5)

## 2023-03-25 MED ORDER — PROMETHAZINE HCL 25 MG PO TABS: 25.0000 mg | ORAL_TABLET | Freq: Four times a day (QID) | ORAL | 2 refills | Status: DC | PRN

## 2023-03-25 MED ORDER — HYOSCYAMINE SULFATE 0.125 MG SL SUBL
0.1250 mg | SUBLINGUAL_TABLET | Freq: Once | SUBLINGUAL | Status: AC
  Administered 2023-03-25: 0.125 mg via SUBLINGUAL
  Filled 2023-03-25: qty 1

## 2023-03-25 MED ORDER — ONDANSETRON 4 MG PO TBDP: 4.0000 mg | ORAL_TABLET | Freq: Four times a day (QID) | ORAL | 0 refills | Status: DC | PRN

## 2023-03-25 MED ORDER — ONDANSETRON 4 MG PO TBDP
4.0000 mg | ORAL_TABLET | Freq: Once | ORAL | Status: AC
  Administered 2023-03-25: 4 mg via ORAL
  Filled 2023-03-25: qty 1

## 2023-03-25 NOTE — MAU Provider Note (Signed)
Chief Complaint: Abdominal Pain   Event Date/Time   First Provider Initiated Contact with Patient 03/25/23 0113        SUBJECTIVE HPI: Milaina Simonis is a 27 y.o. G2X5284 at [redacted]w[redacted]d by LMP who presents to maternity admissions reporting pain in upper and lower left side. Tylenol did not help.  Has had nausea and vomiting x 3 today.  . She denies vaginal bleeding, vaginal itching/burning, urinary symptoms, h/a, dizziness, n/v, or fever/chills.    Abdominal Pain This is a new problem. The current episode started in the past 7 days. The pain is located in the LLQ and LUQ. The quality of the pain is cramping. The abdominal pain does not radiate. Pertinent negatives include no constipation, diarrhea, dysuria, fever, frequency or myalgias. Nothing aggravates the pain. The pain is relieved by Nothing. She has tried acetaminophen for the symptoms. The treatment provided no relief.   RN Note: Tzivia Dawson is a 27 y.o. at Unknown here in MAU reporting: upper and left lower abdominal pain for 3 days, got worse today. Has vomited 3 times today and feels dehydrated.   Took tylenol at 2000, did not help.  LMP: 02/20/2023  Past Medical History:  Diagnosis Date   Chlamydia    Heart murmur    Migraine    Past Surgical History:  Procedure Laterality Date   NO PAST SURGERIES     Social History   Socioeconomic History   Marital status: Single    Spouse name: Not on file   Number of children: Not on file   Years of education: Not on file   Highest education level: Not on file  Occupational History   Not on file  Tobacco Use   Smoking status: Never   Smokeless tobacco: Never  Vaping Use   Vaping status: Never Used  Substance and Sexual Activity   Alcohol use: Yes   Drug use: No   Sexual activity: Yes    Birth control/protection: None, Condom  Other Topics Concern   Not on file  Social History Narrative   Not on file   Social Determinants of Health   Financial Resource Strain: Not at  Risk (03/24/2023)   Received from [employer]    Financial Resource Strain: 1  Food Insecurity: Not at Risk (03/24/2023)   Received from [facility] Insecurity    Food: 1  Transportation Needs: Not at Risk (03/24/2023)   Received from [employer] Needs    Transportation: 1  Physical Activity: Not on File (02/03/2023)   Received from [facility]   Physical Activity    Physical Activity: 0  Stress: Not on File (02/03/2023)   Received from [facility]   Stress    Stress: 0  Social Connections: Not on File (02/03/2023)   Received from [facility]   Social Connections    Social Connections and Isolation: 0  Intimate Partner Violence: Unknown (11/06/2021)   Received from [facility]   HITS    Physically Hurt: Not on file    Insult or Talk Down To: Not on file    Threaten Physical Harm: Not on file    Scream or Curse: Not on file   No current facility-administered medications on file prior to encounter.   Current Outpatient Medications on File Prior to Encounter  Medication Sig Dispense Refill   naproxen (NAPROSYN) 500 MG tablet Take 1 tablet (500 mg total) by mouth 2 (two) times daily as needed (pain). (Patient not taking:  Reported on 08/29/2022) 30 tablet 0   Allergies  Allergen Reactions   Kiwi Extract Anaphylaxis   Penicillins Hives and Other (See Comments)    Has patient had a PCN reaction causing immediate rash, facial/tongue/throat swelling, SOB or lightheadedness with hypotension: Yes Has patient had a PCN reaction causing severe rash involving mucus membranes or skin necrosis: No Has patient had a PCN reaction that required hospitalization No Has patient had a PCN reaction occurring within the last 10 years: No If all of the above answers are "NO", then may proceed with Cephalosporin use.    Lobster [Shellfish Allergy] Rash    I have reviewed patient's Past Medical Hx, Surgical Hx, Family Hx, Social Hx, medications and allergies.   ROS:  Review of Systems   Constitutional:  Negative for fever.  Gastrointestinal:  Positive for abdominal pain. Negative for constipation and diarrhea.  Genitourinary:  Negative for dysuria and frequency.  Musculoskeletal:  Negative for myalgias.   Review of Systems  Other systems negative   Physical Exam  Physical Exam Patient Vitals for the past 24 hrs:  BP Temp Temp src Pulse Resp SpO2 Height Weight  03/24/23 2359 123/70 98.7 F (37.1 C) Oral 71 18 99 % 5\' 4"  (1.626 m) 65 kg   Constitutional: Well-developed, well-nourished female in no acute distress.  Cardiovascular: normal rate Respiratory: normal effort GI: Abd soft, non-tender.  MS: Extremities nontender, no edema, normal ROM Neurologic: Alert and oriented x 4.  GU: Neg CVAT.  PELVIC EXAM: Deferred in lieu of ultrasound  LAB RESULTS Results for orders placed or performed during the hospital encounter of 03/24/23 (from the past 24 hour(s))  CBC     Status: Abnormal   Collection Time: 03/24/23 11:32 PM  Result Value Ref Range   WBC 4.1 4.0 - 10.5 K/uL   RBC 3.72 (L) 3.87 - 5.11 MIL/uL   Hemoglobin 12.1 12.0 - 15.0 g/dL   HCT 74.2 59.5 - 63.8 %   MCV 97.0 80.0 - 100.0 fL   MCH 32.5 26.0 - 34.0 pg   MCHC 33.5 30.0 - 36.0 g/dL   RDW 75.6 43.3 - 29.5 %   Platelets 328 150 - 400 K/uL   nRBC 0.0 0.0 - 0.2 %  Comprehensive metabolic panel     Status: None   Collection Time: 03/24/23 11:32 PM  Result Value Ref Range   Sodium 138 135 - 145 mmol/L   Potassium 3.5 3.5 - 5.1 mmol/L   Chloride 106 98 - 111 mmol/L   CO2 24 22 - 32 mmol/L   Glucose, Bld 93 70 - 99 mg/dL   BUN 6 6 - 20 mg/dL   Creatinine, Ser 1.88 0.44 - 1.00 mg/dL   Calcium 9.0 8.9 - 41.6 mg/dL   Total Protein 6.8 6.5 - 8.1 g/dL   Albumin 3.8 3.5 - 5.0 g/dL   AST 16 15 - 41 U/L   ALT 14 0 - 44 U/L   Alkaline Phosphatase 54 38 - 126 U/L   Total Bilirubin 0.7 0.3 - 1.2 mg/dL   GFR, Estimated >60 >63 mL/min   Anion gap 8 5 - 15  hCG, quantitative, pregnancy     Status:  Abnormal   Collection Time: 03/24/23 11:32 PM  Result Value Ref Range   hCG, Beta Chain, Quant, S 7,721 (H) <5 mIU/mL  Wet prep, genital     Status: Abnormal   Collection Time: 03/25/23 12:04 AM  Result Value Ref Range   Yeast Wet Prep HPF  POC NONE SEEN NONE SEEN   Trich, Wet Prep NONE SEEN NONE SEEN   Clue Cells Wet Prep HPF POC PRESENT (A) NONE SEEN   WBC, Wet Prep HPF POC <10 <10   Sperm NONE SEEN   Urinalysis, Routine w reflex microscopic -Urine, Clean Catch     Status: Abnormal   Collection Time: 03/25/23 12:13 AM  Result Value Ref Range   Color, Urine YELLOW YELLOW   APPearance HAZY (A) CLEAR   Specific Gravity, Urine 1.013 1.005 - 1.030   pH 7.0 5.0 - 8.0   Glucose, UA NEGATIVE NEGATIVE mg/dL   Hgb urine dipstick NEGATIVE NEGATIVE   Bilirubin Urine NEGATIVE NEGATIVE   Ketones, ur NEGATIVE NEGATIVE mg/dL   Protein, ur NEGATIVE NEGATIVE mg/dL   Nitrite NEGATIVE NEGATIVE   Leukocytes,Ua TRACE (A) NEGATIVE   RBC / HPF 0-5 0 - 5 RBC/hpf   WBC, UA 6-10 0 - 5 WBC/hpf   Bacteria, UA RARE (A) NONE SEEN   Squamous Epithelial / HPF 11-20 0 - 5 /HPF   Mucus PRESENT        IMAGING US OB LESS THAN 14 WEEKS WITH OB TRANSVAGINAL  Result Date: 03/25/2023 CLINICAL DATA:  Left-sided abdominal pain x3 days. EXAM: OBSTETRIC <14 WK [place] AND TRANSVAGINAL OB US TECHNIQUE: Both transabdominal and transvaginal ultrasound examinations were performed for complete evaluation of the gestation as well as the maternal uterus, adnexal regions, and pelvic cul-de-sac. Transvaginal technique was performed to assess early pregnancy. COMPARISON:  January 27, 2023 FINDINGS: Intrauterine gestational sac: Single Yolk sac:  Not Visualized. Embryo:  Not Visualized. Cardiac Activity: Not Visualized. Heart Rate: N/A  bpm MSD: 6.2 mm   5 w   2 d Subchorionic hemorrhage:  None visualized. Maternal uterus/adnexae: The right ovary measures 1.6 cm x 2.6 cm x 3.0 cm and is normal in appearance. The left ovary measures 3.1 cm x  3.6 cm x 2.0 cm and contains a small corpus luteum cyst. A small amount of pelvic free fluid is seen. IMPRESSION: Probable early intrauterine gestational sac, but no yolk sac, fetal pole, or cardiac activity yet visualized. Recommend follow-up quantitative B-HCG levels and follow-up US in 14 days to assess viability. This recommendation follows SRU consensus guidelines: Diagnostic Criteria for Nonviable Pregnancy Early in the First Trimester. Malva Limes Med 2013; 563:8756-43. Electronically Signed   By: Aram Candela M.D.   On: 03/25/2023 01:08    MAU Management/MDM: I have reviewed the triage vital signs and the nursing notes.   Pertinent labs & imaging results that were available during my care of the patient were reviewed by me and considered in my medical decision making (see chart for details).      I have reviewed her medical records including past results, notes and treatments. Medical, Surgical, and family history were reviewed.  Medications and recent lab tests were reviewed  Ordered usual first trimester r/o ectopic labs.   Pelvic cultures done Will check baseline Ultrasound to rule out ectopic.  This bleeding/pain can represent a normal pregnancy with bleeding, spontaneous abortion or even an ectopic which can be life-threatening.  The process as listed above helps to determine which of these is present.  Reviewed ultrasound findings While this effectively rules out ectopic pregnancy, it does not suggest definitive viability.  Recommend repeating US in 10 days.    ASSESSMENT Pregnancy at [redacted]w[redacted]d by LMP Abdominal pain in pregnancy Pregnancy of unknown location Nausea and vomiting  PLAN Discharge home Will repeat  Ultrasound  in about 7-10 days   SAB precautions Rx Phenergan and zofran for nausea  Pt stable at time of discharge. Encouraged to return here if she develops worsening of symptoms, increase in pain, fever, or other concerning symptoms.    Wynelle Bourgeois CNM,  MSN Certified Nurse-Midwife 03/25/2023  1:13 AM

## 2023-03-27 ENCOUNTER — Inpatient Hospital Stay (HOSPITAL_COMMUNITY)
Admission: AD | Admit: 2023-03-27 | Discharge: 2023-03-28 | Disposition: A | Discharge status: Home / Self Care | Payer: Medicaid Other | Attending: Obstetrics & Gynecology | Admitting: Obstetrics & Gynecology

## 2023-03-27 DIAGNOSIS — R109 Unspecified abdominal pain: Secondary | ICD-10-CM

## 2023-03-27 DIAGNOSIS — A549 Gonococcal infection, unspecified: Secondary | ICD-10-CM | POA: Insufficient documentation

## 2023-03-27 DIAGNOSIS — O98311 Other infections with a predominantly sexual mode of transmission complicating pregnancy, first trimester: Secondary | ICD-10-CM | POA: Insufficient documentation

## 2023-03-27 DIAGNOSIS — Z3A01 Less than 8 weeks gestation of pregnancy: Secondary | ICD-10-CM | POA: Insufficient documentation

## 2023-03-27 DIAGNOSIS — O26891 Other specified pregnancy related conditions, first trimester: Secondary | ICD-10-CM | POA: Insufficient documentation

## 2023-03-27 DIAGNOSIS — R102 Pelvic and perineal pain: Secondary | ICD-10-CM | POA: Insufficient documentation

## 2023-03-27 NOTE — MAU Note (Signed)
.  Brandy [name] is a 27 y.o. at [redacted]w[redacted]d here in MAU reporting: having pain in her lower abd and vaginal area since this am. Pt states she got a call that her GC culture was positive. Denies dysuria, denies vaginal bleeding.  Onset of complaint: today Pain score: 8/10 lower abd and vaginal area Vitals:   03/27/23 2356  BP: 117/71  Pulse: 80  Resp: 16  Temp: 98.9 F (37.2 C)  SpO2: 98%     FHT:na Lab orders placed from triage:ua

## 2023-03-28 DIAGNOSIS — R102 Pelvic and perineal pain: Secondary | ICD-10-CM | POA: Diagnosis not present

## 2023-03-28 DIAGNOSIS — R109 Unspecified abdominal pain: Secondary | ICD-10-CM | POA: Diagnosis not present

## 2023-03-28 DIAGNOSIS — Z3A01 Less than 8 weeks gestation of pregnancy: Secondary | ICD-10-CM | POA: Diagnosis not present

## 2023-03-28 DIAGNOSIS — O98311 Other infections with a predominantly sexual mode of transmission complicating pregnancy, first trimester: Secondary | ICD-10-CM

## 2023-03-28 DIAGNOSIS — A549 Gonococcal infection, unspecified: Secondary | ICD-10-CM | POA: Diagnosis not present

## 2023-03-28 DIAGNOSIS — O26891 Other specified pregnancy related conditions, first trimester: Secondary | ICD-10-CM | POA: Diagnosis not present

## 2023-03-28 LAB — URINALYSIS, ROUTINE W REFLEX MICROSCOPIC
Bilirubin Urine: NEGATIVE
Glucose, UA: NEGATIVE mg/dL
Hgb urine dipstick: NEGATIVE
Ketones, ur: 80 mg/dL — AB
Nitrite: NEGATIVE
Protein, ur: NEGATIVE mg/dL
Specific Gravity, Urine: 1.025 (ref 1.005–1.030)
pH: 5 (ref 5.0–8.0)

## 2023-03-28 MED ORDER — CEFTRIAXONE SODIUM 500 MG IJ SOLR
500.0000 mg | Freq: Once | INTRAMUSCULAR | Status: AC
  Administered 2023-03-28: 500 mg via INTRAMUSCULAR
  Filled 2023-03-28: qty 500

## 2023-03-28 MED ORDER — CYCLOBENZAPRINE HCL 5 MG PO TABS
10.0000 mg | ORAL_TABLET | Freq: Once | ORAL | Status: AC
  Administered 2023-03-28: 10 mg via ORAL
  Filled 2023-03-28: qty 2

## 2023-03-28 NOTE — MAU Provider Note (Signed)
History     CSN: [id number]  Arrival date and time: 03/27/23 2345   Event Date/Time   First Provider Initiated Contact with Patient 03/28/23 0003      Chief Complaint  Patient presents with   Abdominal Pain   Vaginal Pain    Brandy [name] is a 27 y.o. [id number] at [redacted]w[redacted]d by LMP.  She presents today for abdominal and vaginal pain.  She states the abdominal pain is the same from her previous visit.  She reports the pain is intermittent and describes it as sharp.  She states it lasts 2-3 minutes and stops.  She also reports vaginal pain that feels like " a bad period cramp, but I am not bleeding."  She rates the pain a 10/10 when it occurs. She reports taking tylenol with some relief and last dose at 2030.  She reports no other relieving or worsening factors.  Patient denies issues with urination, constipation, or diarrhea.   OB History     Gravida  4   Para  3   Term  1   Preterm  2   AB      Living  3      SAB      IAB      Ectopic      Multiple  0   Live Births  3           Past Medical History:  Diagnosis Date   Chlamydia    Heart murmur    Migraine     Past Surgical History:  Procedure Laterality Date   NO PAST SURGERIES      Family History  Problem Relation Age of Onset   Asthma Mother     Social History   Tobacco Use   Smoking status: Never   Smokeless tobacco: Never  Vaping Use   Vaping status: Never Used  Substance Use Topics   Alcohol use: Yes   Drug use: No    Allergies:  Allergies  Allergen Reactions   Kiwi Extract Anaphylaxis   Penicillins Hives and Other (See Comments)    Has patient had a PCN reaction causing immediate rash, facial/tongue/throat swelling, SOB or lightheadedness with hypotension: Yes Has patient had a PCN reaction causing severe rash involving mucus membranes or skin necrosis: No Has patient had a PCN reaction that required hospitalization No Has patient had a PCN reaction occurring within the last 10  years: No If all of the above answers are "NO", then may proceed with Cephalosporin use.    Lobster [Shellfish Allergy] Rash    Medications Prior to Admission  Medication Sig Dispense Refill Last Dose   ondansetron (ZOFRAN-ODT) 4 MG disintegrating tablet Take 1 tablet (4 mg total) by mouth every 6 (six) hours as needed for nausea. 20 tablet 0    promethazine (PHENERGAN) 25 MG tablet Take 1 tablet (25 mg total) by mouth every 6 (six) hours as needed for nausea or vomiting. 30 tablet 2     Review of Systems  Gastrointestinal:  Positive for abdominal pain. Negative for constipation, diarrhea, nausea and vomiting.  Genitourinary:  Positive for vaginal pain. Negative for difficulty urinating, dysuria, vaginal bleeding and vaginal discharge.   Physical Exam   Blood pressure 117/71, pulse 80, temperature 98.9 F (37.2 C), temperature source Oral, resp. rate 16, height 5\' 4"  (1.626 m), weight 64.4 kg, last menstrual period 02/20/2023, SpO2 98%.  Physical Exam Vitals reviewed.  Constitutional:      Appearance: She is well-developed.  HENT:     Head: Normocephalic and atraumatic.  Eyes:     Conjunctiva/sclera: Conjunctivae normal.  Cardiovascular:     Rate and Rhythm: Normal rate.  Pulmonary:     Effort: Pulmonary effort is normal. No respiratory distress.  Abdominal:     Palpations: Abdomen is soft.     Tenderness: There is abdominal tenderness in the right lower quadrant, suprapubic area and left lower quadrant. There is no guarding.  Musculoskeletal:        General: Normal range of motion.     Cervical back: Normal range of motion.  Skin:    General: Skin is warm and dry.  Neurological:     Mental Status: She is alert and oriented to person, place, and time.  Psychiatric:        Mood and Affect: Mood normal.        Behavior: Behavior normal.     MAU Course  Procedures No results found for this or any previous visit (from the past 24 hour(s)).  MDM Exam Muscle  Relaxant STD Treatment Assessment and Plan  27 year old G4P1023 at 5.1 weeks Abdominal Pain GC Infection  -Reviewed POC with patient. -Exam performed and findings discussed.  -Patient offered and accepts pain medication. -Will give flexeril 10mg  now. -Plan to give Rocephin IM 500mg  per CDC pregnancy guidelines.  -Encouraged to have partner treated.  -Monitor and reassess.   Cherre Robins 03/28/2023, 12:03 AM   Reassessment (12:50 AM) -Patient reports improvement in pain with flexeril dosing. -Injection given. -Discharge placed. -Patient to follow up as scheduled for repeat US. -Return precautions reviewed.

## 2023-04-02 ENCOUNTER — Encounter (HOSPITAL_COMMUNITY): Payer: Self-pay | Admitting: Obstetrics and Gynecology

## 2023-04-02 ENCOUNTER — Inpatient Hospital Stay (HOSPITAL_COMMUNITY)
Admission: AD | Admit: 2023-04-02 | Discharge: 2023-04-03 | Disposition: A | Discharge status: Home / Self Care | Payer: Medicaid Other | Source: Home / Self Care | Attending: Obstetrics and Gynecology | Admitting: Obstetrics and Gynecology

## 2023-04-02 DIAGNOSIS — O219 Vomiting of pregnancy, unspecified: Secondary | ICD-10-CM | POA: Diagnosis not present

## 2023-04-02 DIAGNOSIS — O99611 Diseases of the digestive system complicating pregnancy, first trimester: Secondary | ICD-10-CM | POA: Insufficient documentation

## 2023-04-02 DIAGNOSIS — K5900 Constipation, unspecified: Secondary | ICD-10-CM | POA: Insufficient documentation

## 2023-04-02 DIAGNOSIS — Z3A01 Less than 8 weeks gestation of pregnancy: Secondary | ICD-10-CM | POA: Insufficient documentation

## 2023-04-02 DIAGNOSIS — O26891 Other specified pregnancy related conditions, first trimester: Secondary | ICD-10-CM | POA: Insufficient documentation

## 2023-04-02 MED ORDER — METOCLOPRAMIDE HCL 10 MG PO TABS
10.0000 mg | ORAL_TABLET | Freq: Once | ORAL | Status: AC
  Administered 2023-04-02: 10 mg via ORAL
  Filled 2023-04-02: qty 1

## 2023-04-02 NOTE — MAU Provider Note (Signed)
History     CSN: [id number]  Arrival date and time: 04/02/23 2136   Event Date/Time   First Provider Initiated Contact with Patient 04/02/23 2321      Chief Complaint  Patient presents with   Nausea   Emesis    Brandy [name] is a 27 y.o. [id number] at [redacted]w[redacted]d who is scheduled to receive care at [facility].  She presents today for nausea, vomiting, and dizziness.  Patient reports she ate [employer] this morning and nothing else.  She reports vomiting 3x today with last incident at 7pm.  She reports taking Zofran this morning.  She reports drinking water t/o the day.   OB History     Gravida  4   Para  3   Term  1   Preterm  2   AB      Living  3      SAB      IAB      Ectopic      Multiple  0   Live Births  3           Past Medical History:  Diagnosis Date   Chlamydia    Heart murmur    Migraine     Past Surgical History:  Procedure Laterality Date   NO PAST SURGERIES      Family History  Problem Relation Age of Onset   Asthma Mother     Social History   Tobacco Use   Smoking status: Never   Smokeless tobacco: Never  Vaping Use   Vaping status: Never Used  Substance Use Topics   Alcohol use: Yes   Drug use: No    Allergies:  Allergies  Allergen Reactions   Kiwi Extract Anaphylaxis   Penicillins Hives and Other (See Comments)    Has patient had a PCN reaction causing immediate rash, facial/tongue/throat swelling, SOB or lightheadedness with hypotension: Yes Has patient had a PCN reaction causing severe rash involving mucus membranes or skin necrosis: No Has patient had a PCN reaction that required hospitalization No Has patient had a PCN reaction occurring within the last 10 years: No If all of the above answers are "NO", then may proceed with Cephalosporin use.    Lobster [Shellfish Allergy] Rash    Medications Prior to Admission  Medication Sig Dispense Refill Last Dose   ondansetron (ZOFRAN-ODT) 4 MG disintegrating tablet Take  1 tablet (4 mg total) by mouth every 6 (six) hours as needed for nausea. 20 tablet 0 04/02/2023   promethazine (PHENERGAN) 25 MG tablet Take 1 tablet (25 mg total) by mouth every 6 (six) hours as needed for nausea or vomiting. 30 tablet 2 04/01/2023    Review of Systems  Gastrointestinal:  Positive for constipation (Last BM beginning of August), nausea and vomiting. Negative for abdominal pain and diarrhea.  Genitourinary:  Negative for difficulty urinating, dysuria, vaginal bleeding and vaginal discharge.  Musculoskeletal:  Negative for back pain.  Neurological:  Positive for dizziness (None today). Negative for light-headedness and headaches.   Physical Exam   Blood pressure 106/60, pulse 64, temperature 99 F (37.2 C), temperature source Oral, resp. rate 17, height 5\' 4"  (1.626 m), weight 65 kg, last menstrual period 02/20/2023, SpO2 100%.  Physical Exam Constitutional:      Appearance: Normal appearance.  HENT:     Head: Normocephalic and atraumatic.  Eyes:     Conjunctiva/sclera: Conjunctivae normal.  Cardiovascular:     Rate and Rhythm: Normal rate.  Pulmonary:  Effort: Pulmonary effort is normal. No respiratory distress.  Musculoskeletal:        General: Normal range of motion.     Cervical back: Normal range of motion.  Skin:    General: Skin is warm.  Neurological:     Mental Status: She is alert and oriented to person, place, and time.  Psychiatric:        Mood and Affect: Mood normal.        Behavior: Behavior normal.     MAU Course  Procedures No results found for this or any previous visit (from the past 24 hour(s)).  MDM ***  Assessment and Plan  27 year old G4P1023 at 5.6 weeks Nausea Vomiting   -Reviewed POC with patient. -Exam performed and findings discussed.  -*** -*** -***   Cherre Robins 04/02/2023, 11:21 PM

## 2023-04-02 NOTE — MAU Note (Addendum)
..  Brandy [name] is a 27 y.o. at [redacted]w[redacted]d here in MAU reporting: reports that she can't eat or drink anything. Has been taking zofran and phenergan but they have not helped. Reports dizziness, mostly when she gets up and feels like the room is spinning.  Reports lower abdominal cramping   Onset of complaint: couple of days Pain score: 5/10 Vitals:   04/02/23 2150  BP: 106/60  Pulse: 64  Resp: 17  Temp: 99 F (37.2 C)  SpO2: 100%     FHT:n/a Lab orders placed from triage:  UA

## 2023-04-03 DIAGNOSIS — O219 Vomiting of pregnancy, unspecified: Secondary | ICD-10-CM | POA: Diagnosis not present

## 2023-04-03 DIAGNOSIS — Z3A01 Less than 8 weeks gestation of pregnancy: Secondary | ICD-10-CM

## 2023-04-03 LAB — URINALYSIS, ROUTINE W REFLEX MICROSCOPIC
Bilirubin Urine: NEGATIVE
Glucose, UA: NEGATIVE mg/dL
Hgb urine dipstick: NEGATIVE
Ketones, ur: 5 mg/dL — AB
Nitrite: NEGATIVE
Protein, ur: NEGATIVE mg/dL
Specific Gravity, Urine: 1.024 (ref 1.005–1.030)
pH: 6 (ref 5.0–8.0)

## 2023-04-03 MED ORDER — METOCLOPRAMIDE HCL 10 MG PO TABS: 10.0000 mg | ORAL_TABLET | Freq: Four times a day (QID) | ORAL | 0 refills | Status: DC

## 2023-04-03 MED ORDER — POLYETHYLENE GLYCOL 3350 17 G PO PACK: 17.0000 g | PACK | Freq: Every day | ORAL | 0 refills | Status: DC

## 2023-04-03 MED ORDER — DOCUSATE SODIUM 100 MG PO CAPS: 100.0000 mg | ORAL_CAPSULE | Freq: Two times a day (BID) | ORAL | 2 refills | Status: DC

## 2023-04-03 NOTE — Discharge Instructions (Signed)
You have constipation which is hard stools that are difficult to pass. It is important to have regular bowel movements every 1-3 days that are soft and easy to pass. Hard stools increase your risk of hemorrhoids and are very uncomfortable.  ° °To prevent constipation you can increase the amount of fiber in your diet. Examples of foods with fiber are leafy greens, whole grain breads, oatmeal and other grains.  It is also important to drink at least eight 8oz glass of water everyday.  ° °If you have not has a bowel movement in 4-5 days you made need to clean out your bowel.  This will have establish normal movement through your bowel.   ° °Miralax Clean out °· Take 8 capfuls of miralax in 64 oz of gatorade. You can use any fluid that appeals to you (gatorade, water, juice) °· Continue to drink at least eight 8 oz glasses of water throughout the day °· You can repeat with another 8 capfuls of miralax in 64 oz of gatorade if you are not having a large amount of stools °· You will need to be at home and close to a bathroom for about 8 hours when you do the above as you may need to go to the bathroom frequently.  ° °After you are cleaned out: °- Start Colace100mg twice daily °- Start Miralax once daily °- Start a daily fiber supplement like metamucil or citrucel °- You can safely use enemas in pregnancy  °- if you are having diarrhea you can reduce to Colace once a day or miralax every other day or a 1/2 capful daily.  °

## 2023-04-06 ENCOUNTER — Ambulatory Visit (HOSPITAL_COMMUNITY)
Admission: RE | Admit: 2023-04-06 | Discharge: 2023-04-06 | Disposition: A | Discharge status: Home / Self Care | Payer: Medicaid Other | Source: Ambulatory Visit | Attending: Advanced Practice Midwife | Admitting: Advanced Practice Midwife

## 2023-04-06 DIAGNOSIS — O3680X0 Pregnancy with inconclusive fetal viability, not applicable or unspecified: Secondary | ICD-10-CM

## 2023-04-06 DIAGNOSIS — Z3A01 Less than 8 weeks gestation of pregnancy: Secondary | ICD-10-CM | POA: Diagnosis not present

## 2023-04-06 DIAGNOSIS — O219 Vomiting of pregnancy, unspecified: Secondary | ICD-10-CM | POA: Insufficient documentation

## 2023-04-06 DIAGNOSIS — R1030 Lower abdominal pain, unspecified: Secondary | ICD-10-CM | POA: Diagnosis present

## 2023-04-06 DIAGNOSIS — O43891 Other placental disorders, first trimester: Secondary | ICD-10-CM | POA: Diagnosis not present

## 2023-04-21 ENCOUNTER — Encounter (HOSPITAL_COMMUNITY): Payer: Self-pay | Admitting: Obstetrics and Gynecology

## 2023-04-21 ENCOUNTER — Inpatient Hospital Stay (HOSPITAL_COMMUNITY)
Admission: AD | Admit: 2023-04-21 | Discharge: 2023-04-21 | Disposition: A | Discharge status: Home / Self Care | Payer: Medicaid Other | Attending: Obstetrics and Gynecology | Admitting: Obstetrics and Gynecology

## 2023-04-21 DIAGNOSIS — O2341 Unspecified infection of urinary tract in pregnancy, first trimester: Secondary | ICD-10-CM

## 2023-04-21 DIAGNOSIS — R1031 Right lower quadrant pain: Secondary | ICD-10-CM | POA: Insufficient documentation

## 2023-04-21 DIAGNOSIS — Z880 Allergy status to penicillin: Secondary | ICD-10-CM | POA: Insufficient documentation

## 2023-04-21 DIAGNOSIS — N390 Urinary tract infection, site not specified: Secondary | ICD-10-CM | POA: Diagnosis not present

## 2023-04-21 DIAGNOSIS — O26891 Other specified pregnancy related conditions, first trimester: Secondary | ICD-10-CM | POA: Insufficient documentation

## 2023-04-21 DIAGNOSIS — B9689 Other specified bacterial agents as the cause of diseases classified elsewhere: Secondary | ICD-10-CM | POA: Diagnosis not present

## 2023-04-21 DIAGNOSIS — O210 Mild hyperemesis gravidarum: Secondary | ICD-10-CM

## 2023-04-21 DIAGNOSIS — Z3A08 8 weeks gestation of pregnancy: Secondary | ICD-10-CM | POA: Diagnosis not present

## 2023-04-21 DIAGNOSIS — E876 Hypokalemia: Secondary | ICD-10-CM

## 2023-04-21 LAB — URINALYSIS, ROUTINE W REFLEX MICROSCOPIC
Bilirubin Urine: NEGATIVE
Glucose, UA: NEGATIVE mg/dL
Hgb urine dipstick: NEGATIVE
Ketones, ur: 5 mg/dL — AB
Nitrite: NEGATIVE
Protein, ur: NEGATIVE mg/dL
Specific Gravity, Urine: 1.023 (ref 1.005–1.030)
pH: 7 (ref 5.0–8.0)

## 2023-04-21 LAB — CBC
HCT: 36.5 % (ref 36.0–46.0)
Hemoglobin: 12.4 g/dL (ref 12.0–15.0)
MCH: 33.7 pg (ref 26.0–34.0)
MCHC: 34 g/dL (ref 30.0–36.0)
MCV: 99.2 fL (ref 80.0–100.0)
Platelets: 296 10*3/uL (ref 150–400)
RBC: 3.68 MIL/uL — ABNORMAL LOW (ref 3.87–5.11)
RDW: 11.5 % (ref 11.5–15.5)
WBC: 4.6 10*3/uL (ref 4.0–10.5)
nRBC: 0 % (ref 0.0–0.2)

## 2023-04-21 LAB — COMPREHENSIVE METABOLIC PANEL WITH GFR
ALT: 16 U/L (ref 0–44)
AST: 18 U/L (ref 15–41)
Albumin: 3.8 g/dL (ref 3.5–5.0)
Alkaline Phosphatase: 46 U/L (ref 38–126)
Anion gap: 7 (ref 5–15)
BUN: 7 mg/dL (ref 6–20)
CO2: 25 mmol/L (ref 22–32)
Calcium: 9.4 mg/dL (ref 8.9–10.3)
Chloride: 102 mmol/L (ref 98–111)
Creatinine, Ser: 0.73 mg/dL (ref 0.44–1.00)
GFR, Estimated: >60 mL/min (ref >60)
Glucose, Bld: 84 mg/dL (ref 70–99)
Potassium: 3.1 mmol/L — ABNORMAL LOW (ref 3.5–5.1)
Sodium: 134 mmol/L — ABNORMAL LOW (ref 135–145)
Total Bilirubin: 1 mg/dL (ref 0.3–1.2)
Total Protein: 7.2 g/dL (ref 6.5–8.1)

## 2023-04-21 LAB — MAGNESIUM: Magnesium: 2 mg/dL (ref 1.7–2.4)

## 2023-04-21 MED ORDER — CEPHALEXIN 500 MG PO CAPS
500.0000 mg | ORAL_CAPSULE | Freq: Three times a day (TID) | ORAL | 0 refills | Status: AC
Start: 2023-04-21 — End: 2023-04-26

## 2023-04-21 MED ORDER — LACTATED RINGERS IV BOLUS
1000.0000 mL | Freq: Once | INTRAVENOUS | Status: AC
  Administered 2023-04-21: 1000 mL via INTRAVENOUS

## 2023-04-21 MED ORDER — PANTOPRAZOLE 80MG IVPB - SIMPLE MED: 80.0000 mg | Freq: Once | INTRAVENOUS | Status: DC

## 2023-04-21 MED ORDER — FAMOTIDINE 20 MG PO TABS: 20.0000 mg | ORAL_TABLET | Freq: Two times a day (BID) | ORAL | 1 refills | Status: DC

## 2023-04-21 MED ORDER — DIPHENHYDRAMINE HCL 50 MG PO CAPS: 50.0000 mg | ORAL_CAPSULE | Freq: Four times a day (QID) | ORAL | 1 refills | Status: DC | PRN

## 2023-04-21 MED ORDER — SODIUM CHLORIDE 0.9 % IV SOLN
12.5000 mg | Freq: Once | INTRAVENOUS | Status: AC
  Administered 2023-04-21: 12.5 mg via INTRAVENOUS
  Filled 2023-04-21: qty 0.5

## 2023-04-21 MED ORDER — PANTOPRAZOLE SODIUM 40 MG IV SOLR
40.0000 mg | Freq: Once | INTRAVENOUS | Status: AC
  Administered 2023-04-21: 40 mg via INTRAVENOUS
  Filled 2023-04-21: qty 10

## 2023-04-21 NOTE — MAU Note (Signed)
.  Brandy [name] is a 27 y.o. at [redacted]w[redacted]d here in MAU reporting: Ongoing nausea and vomiting as well as acid reflux and spitting. She reports she stopped taking her nausea medications around 3-4 weeks ago as she felt as if they caused her to have abdominal pain and diarrhea. Attempted to eat this morning around 0900 and immediate threw it up. Denies VB or abdominal pain.  Onset of complaint: Ongoing Pain score: Denies pain.  FHT: n/a Lab orders placed from triage: UA

## 2023-04-21 NOTE — Discharge Instructions (Addendum)
Please see [email or web address] for more information about your vomiting and things you can do to help.  Going forward: - Take pepcid twice per day for acid reflux - Take miralax daily with a goal of daily bowel movement - Can use benadryl and/or promethazine for nausea   Take keflex for UTI. Please finish 5 day course.

## 2023-04-21 NOTE — MAU Provider Note (Cosign Needed Addendum)
History     [id number]  Arrival date and time: 04/21/23 1528    Chief Complaint  Patient presents with   Headache   Nausea   Emesis   Abdominal Pain    HPI Brandy [name] is a 27 y.o. at [redacted]w[redacted]d who is scheduled to receive care [facility]. She presents for ongoing nausea and vomiting as well as acid reflux and spitting. She also notes right sided lower quadrant abdominal pain and constipation. She was seen for this same issue on 8/30 in the MAU. She reports she stopped taking her nausea medication 3-4 weeks ago due to increased abdominal pain and cramping as well as diarrhea that she associated with the nausea medication. She attempted to eat this morning but immediately vomiting. She states she is vomiting 5-6x daily and unable to keep anything down. She denies any vaginal bleeding or leaking of fluids.   Review of discharge summary from last admission on 04/02/2023: Nausea and Vomiting.     OB History     Gravida  4   Para  3   Term  1   Preterm  2   AB      Living  3      SAB      IAB      Ectopic      Multiple  0   Live Births  3           Past Medical History:  Diagnosis Date   Chlamydia    Heart murmur    Migraine     Past Surgical History:  Procedure Laterality Date   NO PAST SURGERIES      Family History  Problem Relation Age of Onset   Asthma Mother     Social History   Socioeconomic History   Marital status: Single    Spouse name: Not on file   Number of children: Not on file   Years of education: Not on file   Highest education level: Not on file  Occupational History   Not on file  Tobacco Use   Smoking status: Never   Smokeless tobacco: Never  Vaping Use   Vaping status: Never Used  Substance and Sexual Activity   Alcohol use: Yes   Drug use: No   Sexual activity: Yes    Birth control/protection: None, Condom  Other Topics Concern   Not on file  Social History Narrative   Not on file   Social Determinants of Health    Financial Resource Strain: Not at Risk (03/24/2023)   Received from [employer]    Financial Resource Strain: 1  Food Insecurity: Not at Risk (03/24/2023)   Received from [facility] Insecurity    Food: 1  Transportation Needs: Not at Risk (03/24/2023)   Received from [employer] Needs    Transportation: 1  Physical Activity: Not on File (02/03/2023)   Received from [facility]   Physical Activity    Physical Activity: 0  Stress: Not on File (02/03/2023)   Received from [facility]   Stress    Stress: 0  Social Connections: Not on File (04/06/2023)   Received from [facility]   Social Connections    Connectedness: 0  Intimate Partner Violence: Unknown (11/06/2021)   Received from [employer]   HITS    Physically Hurt: Not on file    Insult or Talk Down To: Not on file    Threaten Physical Harm: Not on  file    Scream or Curse: Not on file    Allergies  Allergen Reactions   Kiwi Extract Anaphylaxis   Penicillins Hives and Other (See Comments)    Has patient had a PCN reaction causing immediate rash, facial/tongue/throat swelling, SOB or lightheadedness with hypotension: Yes Has patient had a PCN reaction causing severe rash involving mucus membranes or skin necrosis: No Has patient had a PCN reaction that required hospitalization No Has patient had a PCN reaction occurring within the last 10 years: No If all of the above answers are "NO", then may proceed with Cephalosporin use.    Lobster [Shellfish Allergy] Rash   Reglan [Metoclopramide] Diarrhea    No current facility-administered medications on file prior to encounter.   Current Outpatient Medications on File Prior to Encounter  Medication Sig Dispense Refill   docusate sodium (COLACE) 100 MG capsule Take 1 capsule (100 mg total) by mouth every 12 (twelve) hours. 60 capsule 2   metoCLOPramide (REGLAN) 10 MG tablet Take 1 tablet (10 mg total) by mouth every 6 (six) hours. 30 tablet 0    ondansetron (ZOFRAN-ODT) 4 MG disintegrating tablet Take 1 tablet (4 mg total) by mouth every 6 (six) hours as needed for nausea. 20 tablet 0   polyethylene glycol (MIRALAX) 17 g packet Take 17 g by mouth daily. 14 each 0   promethazine (PHENERGAN) 25 MG tablet Take 1 tablet (25 mg total) by mouth every 6 (six) hours as needed for nausea or vomiting. 30 tablet 2     Review of Systems  Constitutional:  Positive for weight loss. Negative for chills and fever.  Respiratory:  Negative for cough, hemoptysis, shortness of breath and wheezing.   Cardiovascular:  Negative for chest pain, palpitations and leg swelling.  Gastrointestinal:  Positive for abdominal pain, constipation, heartburn, nausea and vomiting.  Genitourinary:  Negative for frequency, hematuria and urgency.  Neurological:  Positive for headaches. Negative for dizziness and weakness.   Pertinent positives and negative per HPI, all others reviewed and negative  Physical Exam   BP 105/64   Pulse 60   Temp 98.1 F (36.7 C) (Oral)   Resp 16   Ht 5\' 1"  (1.549 m)   Wt 63.8 kg   LMP 02/20/2023   SpO2 98%   BMI 26.59 kg/m   Patient Vitals for the past 24 hrs:  BP Temp Temp src Pulse Resp SpO2 Height Weight  04/21/23 1804 105/64 -- -- 60 -- -- -- --  04/21/23 1541 115/60 98.1 F (36.7 C) Oral 72 16 98 % -- --  04/21/23 1539 -- -- -- -- -- -- 5\' 1"  (1.549 m) 63.8 kg    Physical Exam Constitutional:      General: She is not in acute distress.    Appearance: She is normal weight. She is not toxic-appearing.  HENT:     Head: Normocephalic and atraumatic.  Cardiovascular:     Rate and Rhythm: Normal rate and regular rhythm.     Heart sounds: Normal heart sounds. No murmur heard.    No friction rub. No gallop.  Pulmonary:     Effort: Pulmonary effort is normal. No respiratory distress.     Breath sounds: Normal breath sounds. No wheezing.  Abdominal:     General: Bowel sounds are normal. There is no distension.      Palpations: Abdomen is soft.     Tenderness: There is abdominal tenderness in the right lower quadrant. There is no guarding. Negative signs include  Rovsing's sign.  Skin:    General: Skin is warm and dry.  Neurological:     Mental Status: She is alert and oriented to person, place, and time.  Psychiatric:        Mood and Affect: Mood normal.        Speech: Speech normal.        Behavior: Behavior normal.      Labs Results for orders placed or performed during the hospital encounter of 04/21/23 (from the past 24 hour(s))  Urinalysis, Routine w reflex microscopic -Urine, Clean Catch     Status: Abnormal   Collection Time: 04/21/23  4:00 PM  Result Value Ref Range   Color, Urine YELLOW YELLOW   APPearance CLOUDY (A) CLEAR   Specific Gravity, Urine 1.023 1.005 - 1.030   pH 7.0 5.0 - 8.0   Glucose, UA NEGATIVE NEGATIVE mg/dL   Hgb urine dipstick NEGATIVE NEGATIVE   Bilirubin Urine NEGATIVE NEGATIVE   Ketones, ur 5 (A) NEGATIVE mg/dL   Protein, ur NEGATIVE NEGATIVE mg/dL   Nitrite NEGATIVE NEGATIVE   Leukocytes,Ua SMALL (A) NEGATIVE   RBC / HPF 0-5 0 - 5 RBC/hpf   WBC, UA 11-20 0 - 5 WBC/hpf   Bacteria, UA RARE (A) NONE SEEN   Squamous Epithelial / HPF 21-50 0 - 5 /HPF   Mucus PRESENT   CBC     Status: Abnormal   Collection Time: 04/21/23  4:40 PM  Result Value Ref Range   WBC 4.6 4.0 - 10.5 K/uL   RBC 3.68 (L) 3.87 - 5.11 MIL/uL   Hemoglobin 12.4 12.0 - 15.0 g/dL   HCT 07.3 71.0 - 62.6 %   MCV 99.2 80.0 - 100.0 fL   MCH 33.7 26.0 - 34.0 pg   MCHC 34.0 30.0 - 36.0 g/dL   RDW 94.8 54.6 - 27.0 %   Platelets 296 150 - 400 K/uL   nRBC 0.0 0.0 - 0.2 %  Comprehensive metabolic panel     Status: Abnormal   Collection Time: 04/21/23  4:40 PM  Result Value Ref Range   Sodium 134 (L) 135 - 145 mmol/L   Potassium 3.1 (L) 3.5 - 5.1 mmol/L   Chloride 102 98 - 111 mmol/L   CO2 25 22 - 32 mmol/L   Glucose, Bld 84 70 - 99 mg/dL   BUN 7 6 - 20 mg/dL   Creatinine, Ser 3.50 0.44 -  1.00 mg/dL   Calcium 9.4 8.9 - 09.3 mg/dL   Total Protein 7.2 6.5 - 8.1 g/dL   Albumin 3.8 3.5 - 5.0 g/dL   AST 18 15 - 41 U/L   ALT 16 0 - 44 U/L   Alkaline Phosphatase 46 38 - 126 U/L   Total Bilirubin 1.0 0.3 - 1.2 mg/dL   GFR, Estimated >81 >82 mL/min   Anion gap 7 5 - 15  Magnesium     Status: None   Collection Time: 04/21/23  4:40 PM  Result Value Ref Range   Magnesium 2.0 1.7 - 2.4 mg/dL    Imaging No results found.  MAU Course  Procedures Lab Orders         Culture, OB Urine         Urinalysis, Routine w reflex microscopic -Urine, Clean Catch         CBC         Comprehensive metabolic panel         Magnesium    Meds ordered this encounter  Medications   lactated ringers bolus 1,000 mL   promethazine (PHENERGAN) 12.5 mg in sodium chloride 0.9 % 50 mL IVPB   DISCONTD: pantoprazole (PROTONIX) 80 mg /NS 100 mL IVPB   pantoprazole (PROTONIX) injection 40 mg   diphenhydrAMINE (BENADRYL) 50 MG capsule    Sig: Take 1 capsule (50 mg total) by mouth every 6 (six) hours as needed (Nausea).    Dispense:  100 capsule    Refill:  1   cephALEXin (KEFLEX) 500 MG capsule    Sig: Take 1 capsule (500 mg total) by mouth 3 (three) times daily for 5 days.    Dispense:  15 capsule    Refill:  0   famotidine (PEPCID) 20 MG tablet    Sig: Take 1 tablet (20 mg total) by mouth 2 (two) times daily.    Dispense:  180 tablet    Refill:  1   Imaging Orders  No imaging studies ordered today    MDM Concern for dehydration, weight loss, constipation 2/2 hyperemesis and not tolerating medications. Gave 1 L LR bolus, pantoprazole, and phenergan for treatment. Patient was able to keep down liquids and solids prior to discharge.  Did look for electrolyte abnormalities and infection. Mild hypokalemia. Encouraged potassium rich foods and gummy PNV. UA revealed UTI which certainly could be contributing to symptoms.  RLQ pain improved with treatment. As patient is not tachycardic, febrile, has  rebound tenderness, do not suspect appendicitis. Does not have an acute abdomen. Feel this pain is 2/2 repeated vomiting and constipation.  Education provided regarding HG and plan for home moving forward. Patient expressed verbal understanding. Return precautions given.  Assessment and Plan  #Nausea and vomiting of pregnancy/Hyperemesis gravidarum  - Fluid resuscitation with LR 1000 bolus  - Protonix 40mg  for heart burn and epigastric pain  - Phenergan for nausea and vomiting management   - Home Plan:  - frequent, small meals  - Diphenhydramine 25mg  PO q4-6h  - Promethazine 12.5mg  PO q4-6h  - Miralax   - Pepcid 20mg  PO  - Gummy prenatals if pills not tolerated   #UTI  - send urine for culture  - keflex rx (allergy to PCN. However has tolerated keflex in the past)  #[redacted] weeks gestation of pregnancy  - Prenatal care with [facility]  Dispo: discharged to home in stable condition. Instructions for when to return to MAU given.  Discharge Instructions     Discharge patient   Complete by: As directed    Discharge disposition: 01-Home or Self Care   Discharge patient date: 04/21/2023      Joanne Gavel, MD 04/21/23 7:25 PM  Attestation of Supervision of Student:  I confirm that I have verified the information documented in the  PA students 's note and that I have also personally reperformed the history, physical exam and all medical decision making activities.  I have verified that all services and findings are accurately documented in this student's note; and I agree with management and plan as outlined in the documentation. I have also made any necessary editorial changes.  Joanne Gavel, MD OB Fellow 04/21/2023 7:25 PM

## 2023-04-23 LAB — CULTURE, OB URINE: Culture: 20000 — AB

## 2023-04-28 ENCOUNTER — Telehealth: Payer: Medicaid Other

## 2023-04-28 DIAGNOSIS — O26899 Other specified pregnancy related conditions, unspecified trimester: Secondary | ICD-10-CM

## 2023-04-28 DIAGNOSIS — Z3689 Encounter for other specified antenatal screening: Secondary | ICD-10-CM

## 2023-04-28 DIAGNOSIS — B009 Herpesviral infection, unspecified: Secondary | ICD-10-CM

## 2023-04-28 DIAGNOSIS — O0990 Supervision of high risk pregnancy, unspecified, unspecified trimester: Secondary | ICD-10-CM | POA: Insufficient documentation

## 2023-04-28 MED ORDER — BLOOD PRESSURE MONITORING DEVI
1.0000 | 0 refills | Status: DC
Start: 2023-04-28 — End: 2023-10-21

## 2023-04-28 MED ORDER — BLOOD PRESSURE MONITORING DEVI
1.0000 | 0 refills | Status: DC
Start: 2023-04-28 — End: 2023-04-28

## 2023-04-28 NOTE — Progress Notes (Signed)
New OB Intake  I connected with Brandy [name]  on 04/28/23 at  8:15 AM EDT by MyChart Video Visit and verified that I am speaking with the correct person using two identifiers. Nurse is located at [facility] and pt is located at home.  I discussed the limitations, risks, security and privacy concerns of performing an evaluation and management service by telephone and the availability of in person appointments. I also discussed with the patient that there may be a patient responsible charge related to this service. The patient expressed understanding and agreed to proceed.  I explained I am completing New OB Intake today. We discussed EDD of 11/27/2023, by Last Menstrual Period. Pt is [id number]. I reviewed her allergies, medications and Medical/Surgical/OB history.    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 Discussed the nature of our practice with multiple providers including residents and students. Due to the size of the practice, the delivering provider may not be the same as those providing prenatal care.   Patient is not interested in water birth. Offered upcoming OB visit with CNM to discuss further.  MyChart/Babyscripts MyChart access verified. I explained pt will have some visits in office and some virtually. Babyscripts instructions given and order placed. Patient verifies receipt of registration text/e-mail. Account successfully created and app downloaded.  Blood Pressure Cuff/Weight Scale Blood pressure cuff ordered for patient to pick-up from [employer]. Explained after first prenatal appt pt will check weekly and document in Babyscripts. Patient does have weight  scale.  Anatomy US Explained first scheduled [place] will be around 19 weeks. Anatomy US scheduled for 07/05/23 at 0915a.  Is patient a CenteringPregnancy candidate?    Not a candidate due to  3 PRETERM If accepted,    Is patient a Mom+Baby Combined Care candidate?  Not a candidate   If accepted, confirm patient does not intend to move from the area for at least 12 months, then notify Mom+Baby staff  Interested in Dante? If yes, send referral and doula dot phrase.   Is patient a candidate for Babyscripts Optimization? No - HROB  First visit review I reviewed new OB appt with patient. Explained pt will be seen by Shantonette Payne,CNM at first visit. Discussed Brandy [name] genetic screening with patient. needs Panorama and Horizon.. Routine prenatal labs  collect at [facility] OB visit.    Last Pap Needs Pap  Brandy [name], [facility] 04/28/2023  8:29 AM

## 2023-04-29 DIAGNOSIS — O0990 Supervision of high risk pregnancy, unspecified, unspecified trimester: Secondary | ICD-10-CM | POA: Diagnosis not present

## 2023-05-04 ENCOUNTER — Ambulatory Visit (INDEPENDENT_AMBULATORY_CARE_PROVIDER_SITE_OTHER): Payer: Medicaid Other | Admitting: Certified Nurse Midwife

## 2023-05-04 ENCOUNTER — Other Ambulatory Visit: Payer: Self-pay

## 2023-05-04 ENCOUNTER — Other Ambulatory Visit (HOSPITAL_COMMUNITY)
Admission: RE | Admit: 2023-05-04 | Discharge: 2023-05-04 | Disposition: A | Discharge status: Home / Self Care | Payer: Medicaid Other | Source: Ambulatory Visit | Attending: Certified Nurse Midwife | Admitting: Certified Nurse Midwife

## 2023-05-04 ENCOUNTER — Encounter: Payer: Self-pay | Admitting: Certified Nurse Midwife

## 2023-05-04 VITALS — Wt 139.9 lb

## 2023-05-04 DIAGNOSIS — Z124 Encounter for screening for malignant neoplasm of cervix: Secondary | ICD-10-CM

## 2023-05-04 DIAGNOSIS — Z3A10 10 weeks gestation of pregnancy: Secondary | ICD-10-CM | POA: Diagnosis not present

## 2023-05-04 DIAGNOSIS — O0991 Supervision of high risk pregnancy, unspecified, first trimester: Secondary | ICD-10-CM | POA: Diagnosis not present

## 2023-05-04 DIAGNOSIS — E049 Nontoxic goiter, unspecified: Secondary | ICD-10-CM

## 2023-05-04 DIAGNOSIS — Z1332 Encounter for screening for maternal depression: Secondary | ICD-10-CM | POA: Diagnosis not present

## 2023-05-04 DIAGNOSIS — O219 Vomiting of pregnancy, unspecified: Secondary | ICD-10-CM

## 2023-05-04 DIAGNOSIS — Z3481 Encounter for supervision of other normal pregnancy, first trimester: Secondary | ICD-10-CM

## 2023-05-04 DIAGNOSIS — B009 Herpesviral infection, unspecified: Secondary | ICD-10-CM

## 2023-05-04 MED ORDER — ONDANSETRON 4 MG PO TBDP: 4.0000 mg | ORAL_TABLET | Freq: Three times a day (TID) | ORAL | 0 refills | Status: DC | PRN

## 2023-05-04 NOTE — Progress Notes (Unsigned)
History:   Brandy [name] is a 27 y.o. [id number] at [redacted]w[redacted]d by {Ob dating:14516} being seen today for her first obstetrical visit.  Her obstetrical history is significant for {ob risk factors:10154}. Patient {does/does not:19097} intend to breast feed. Pregnancy history fully reviewed.  Patient reports {sx:14538}.      HISTORY: OB History  Gravida Para Term Preterm AB Living  4 3 0 3 0 3  SAB IAB Ectopic Multiple Live Births  0 0 0 0 3    # Outcome Date GA Lbr Len/2nd Weight Sex Type Anes PTL Lv  4 Current           3 Preterm 11/22/16 [redacted]w[redacted]d 09:55 / 00:03 5 lb 8.7 oz (2.515 kg) M Vag-Spont EPI  LIV     Name: Almendarez,BOY Kodee     Apgar1: 7  Apgar5: 9  2 Preterm 07/04/15 [redacted]w[redacted]d 13:46 / 00:16 6 lb 1.4 oz (2.76 kg) F Vag-Spont EPI  LIV     Name: Costilla,GIRL Abeni     Apgar1: 9  Apgar5: 9  1 Preterm 12/27/13 [redacted]w[redacted]d  5 lb 12 oz (2.608 kg) M Vag-Spont EPI N LIV     Birth Comments: System Generated. Please review and update pregnancy details.    Last pap smear was done *** and was {Normal/Abnormal Appearance:21344::"normal"}  Past Medical History:  Diagnosis Date   Chlamydia    Heart murmur    Diagnosed as a child   Migraine    Past Surgical History:  Procedure Laterality Date   NO PAST SURGERIES     Family History  Problem Relation Age of Onset   Asthma Mother    Social History   Tobacco Use   Smoking status: Never   Smokeless tobacco: Never  Vaping Use   Vaping status: Never Used  Substance Use Topics   Alcohol use: Yes   Drug use: No   Allergies  Allergen Reactions   Kiwi Extract Swelling   Penicillins Hives and Other (See Comments)    Has patient had a PCN reaction causing immediate rash, facial/tongue/throat swelling, SOB or lightheadedness with hypotension: Yes Has patient had a PCN reaction causing severe rash involving mucus membranes or skin necrosis: No Has patient had a PCN reaction that required hospitalization No Has patient had a PCN reaction  occurring within the last 10 years: No If all of the above answers are "NO", then may proceed with Cephalosporin use.    Lobster [Shellfish Allergy] Rash   Reglan [Metoclopramide] Diarrhea   Current Outpatient Medications on File Prior to Visit  Medication Sig Dispense Refill   Blood Pressure Monitoring DEVI 1 each by Does not apply route once a week. 1 each 0   diphenhydrAMINE (BENADRYL) 50 MG capsule Take 1 capsule (50 mg total) by mouth every 6 (six) hours as needed (Nausea). 100 capsule 1   polyethylene glycol (MIRALAX) 17 g packet Take 17 g by mouth daily. 14 each 0   Prenatal MV & Min w/FA-DHA (PRENATAL GUMMIES) 0.18-25 MG CHEW Chew by mouth.     famotidine (PEPCID) 20 MG tablet Take 1 tablet (20 mg total) by mouth 2 (two) times daily. (Patient not taking: Reported on 05/04/2023) 180 tablet 1   promethazine (PHENERGAN) 25 MG tablet Take 1 tablet (25 mg total) by mouth every 6 (six) hours as needed for nausea or vomiting. (Patient not taking: Reported on 05/04/2023) 30 tablet 2   No current facility-administered medications on file prior to visit.    Review of Systems  Pertinent items noted in HPI and remainder of comprehensive ROS otherwise negative.  Indications for ASA therapy (per UpToDate) One of the following: Previous pregnancy with preeclampsia, especially early onset and with an adverse outcome {yes/no:20286} Multifetal gestation {yes/no:20286} Chronic hypertension {yes/no:20286} Type 1 or 2 diabetes mellitus {yes/no:20286} Chronic kidney disease {yes/no:20286} Autoimmune disease (antiphospholipid syndrome, systemic lupus erythematosus) {yes/no:20286} Two or more of the following: Nulliparity {yes/no:20286} Obesity (body mass index >30 kg/m2) {yes/no:20286} Family history of preeclampsia in mother or sister {yes/no:20286} Age >=35 years {yes/no:20286} Sociodemographic characteristics (African American race, low socioeconomic level) {yes/no:20286} Personal risk factors  (eg, previous pregnancy with low birth weight or small for gestational age infant, previous adverse pregnancy outcome [eg, stillbirth], interval >10 years between pregnancies) {yes/no:20286}  Indications for early GDM screening (FBS, A1C, Random CBG, GTT) First-degree relative with diabetes {yes/no:20286} BMI >30kg/m2 {yes/no:20286} Age > 35 {yes/no:20286} Previous birth of an infant weighing >=4000 g {yes/no:20286} Gestational diabetes mellitus in a previous pregnancy {yes/no:20286} Glycated hemoglobin >=5.7 percent (39 mmol/mol), impaired glucose tolerance, or impaired fasting glucose on previous testing {yes/no:20286} High-risk race/ethnicity (eg, African American, Latino, Native American, [place] American, Pacific Islander) {yes/no:20286} Previous stillbirth of unknown cause {yes/no:20286} Maternal birthweight > 9 lbs {yes/no:20286} History of cardiovascular disease {yes/no:20286} Hypertension or on therapy for hypertension {yes/no:20286} High-density lipoprotein cholesterol level <35 mg/dL (1.61 mmol/L) and/or a triglyceride level >250 mg/dL (0.96 mmol/L) {EAV/WU:98119} Polycystic ovary syndrome {yes/no:20286} Physical inactivity {yes/no:20286} Other clinical condition associated with insulin resistance (eg, severe obesity, acanthosis nigricans) {yes/no:20286} Current use of glucocorticoids {yes/no:20286}   Physical Exam:   Vitals:   05/04/23 0927  Weight: 139 lb 14.4 oz (63.5 kg)      Uterine size:   ***Bedside Ultrasound for FHR check: Viable intrauterine pregnancy with positive cardiac activity noted, fetal heart rate ***bpm Patient informed that the ultrasound is considered a limited obstetric ultrasound and is not intended to be a complete ultrasound exam.  Patient also informed that the ultrasound is not being completed with the intent of assessing for fetal or placental anomalies or any pelvic abnormalities.  Explained that the purpose of today's ultrasound is to assess for  fetal heart rate.  Patient acknowledges the purpose of the exam and the limitations of the study. General: well-developed, well-nourished female in no acute distress  Breasts:  normal appearance, no masses or tenderness bilaterally, exam done in the presence of a chaperone.   Skin: normal coloration and turgor, no rashes  Neurologic: oriented, normal, negative, normal mood  Extremities: normal strength, tone, and muscle mass, ROM of all joints is normal  HEENT PERRLA, extraocular movement intact and sclera clear, anicteric  Neck supple and no masses  Cardiovascular: regular rate and rhythm  Respiratory:  no respiratory distress, normal breath sounds  Abdomen: soft, non-tender; bowel sounds normal; no masses,  no organomegaly  Pelvic: normal external genitalia, no lesions, normal vaginal mucosa, normal vaginal discharge, normal cervix, ***pap smear done. Exam done in the presence of a chaperone.     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 Supervision of high risk pregnancy in first trimester *** - GC/Chlamydia probe amp ([place])not  at [facility] - CBC/D/Plt+RPR+Rh+ABO+RubIgG... - Culture, OB Urine - Hemoglobin A1c - Cytology - PAP - Thyroid Panel With TSH  2. [redacted] weeks gestation of pregnancy *** - GC/Chlamydia probe amp ([place])not at [facility] - CBC/D/Plt+RPR+Rh+ABO+RubIgG... - Culture, OB Urine - Hemoglobin A1c - Cytology - PAP - Thyroid Panel With TSH  3. Cervical cancer screening *** - GC/Chlamydia probe amp ([place])not at [facility] - CBC/D/Plt+RPR+Rh+ABO+RubIgG... - Culture, OB Urine - Hemoglobin A1c - Cytology - PAP -  Thyroid Panel With TSH  4. HSV (herpes simplex virus) infection ***  5. Nausea and vomiting during pregnancy prior to [redacted] weeks gestation *** - GC/Chlamydia probe amp ([place])not at [facility] - CBC/D/Plt+RPR+Rh+ABO+RubIgG... - Culture, OB Urine - Hemoglobin A1c - Cytology - PAP - Thyroid Panel With TSH  6. Encounter for supervision of other normal pregnancy in first trimester *** - PANORAMA PRENATAL TEST  7. Encounter for supervision of other normal pregnancy, first trimester *** - HORIZON Basic Panel   Initial labs drawn. Continue prenatal vitamins. Problem list reviewed and updated. Genetic Screening discussed, {Blank multiple:19196::"Panorama","Horizon"}: {requests/ordered/declines:14581}. Ultrasound discussed; fetal anatomic survey: {Planned/scheduled/na:14534}. Anticipatory guidance about prenatal visits given including labs, ultrasounds, and testing. Weight gain recommendations per IOM guidelines reviewed: underweight/BMI 18.5 or less > 28 - 40 lbs; normal weight/BMI 18.5 - 24.9 > 25 - 35 lbs; overweight/BMI 25 - 29.9 > 15 - 25 lbs; obese/BMI  30 or more > 11 - 20 lbs. Discussed usage of the Babyscripts app for more information about pregnancy, and to track blood pressures. Also discussed usage of virtual visits as additional source of managing and completing prenatal visits.  Patient was encouraged to use MyChart to review results, send requests, and have questions addressed.   The nature of [facility]ulty Practice with multiple MDs and Advanced Practice Providers was explained to patient; also emphasized that residents, students are part of our team. Routine obstetric precautions reviewed. Encouraged to seek out care at our office or emergency room [facility] MAU preferred) for urgent and/or emergent concerns. Return in about 4 weeks (around 06/01/2023) for LOB.     Jaynie Collins, MD, FACOG Obstetrician & Gynecologist, [facility] for  [employer], [facility]

## 2023-05-05 LAB — THYROID PANEL WITH TSH
Free Thyroxine Index: 2.9 (ref 1.2–4.9)
T3 Uptake Ratio: 15 % — ABNORMAL LOW (ref 24–39)
T4, Total: 19 ug/dL (ref 4.5–12.0)
TSH: 0.642 u[IU]/mL (ref 0.450–4.500)

## 2023-05-05 LAB — GC/CHLAMYDIA PROBE AMP (~~LOC~~) NOT AT ARMC
Chlamydia: NEGATIVE
Comment: NEGATIVE
Comment: NORMAL
Neisseria Gonorrhea: NEGATIVE

## 2023-05-05 NOTE — Addendum Note (Signed)
Addended by: Carlynn Herald on: 05/05/2023 07:20 PM   Modules accepted: Orders

## 2023-05-06 LAB — URINE CULTURE, OB REFLEX

## 2023-05-06 LAB — CULTURE, OB URINE

## 2023-05-07 LAB — CBC/D/PLT+RPR+RH+ABO+RUBIGG...
Basophils Absolute: 0 10*3/uL (ref 0.0–0.2)
Basos: 1 %
EOS (ABSOLUTE): 0.1 10*3/uL (ref 0.0–0.4)
Eos: 2 %
HCV Ab: NONREACTIVE
HIV Screen 4th Generation wRfx: NONREACTIVE
Hematocrit: 36.1 % (ref 34.0–46.6)
Hemoglobin: 11.7 g/dL (ref 11.1–15.9)
Hepatitis B Surface Ag: NEGATIVE
Immature Grans (Abs): 0 10*3/uL (ref 0.0–0.1)
Immature Granulocytes: 0 %
Lymphocytes Absolute: 1.4 10*3/uL (ref 0.7–3.1)
Lymphs: 43 %
MCH: 33.6 pg — ABNORMAL HIGH (ref 26.6–33.0)
MCHC: 32.4 g/dL (ref 31.5–35.7)
MCV: 104 fL — ABNORMAL HIGH (ref 79–97)
Monocytes Absolute: 0.2 10*3/uL (ref 0.1–0.9)
Monocytes: 7 %
Neutrophils Absolute: 1.5 10*3/uL (ref 1.4–7.0)
Neutrophils: 47 %
Platelets: 303 10*3/uL (ref 150–450)
RBC: 3.48 x10E6/uL — ABNORMAL LOW (ref 3.77–5.28)
RDW: 11.9 % (ref 11.7–15.4)
RPR Ser Ql: NONREACTIVE
Rh Factor: NEGATIVE
Rubella Antibodies, IGG: 2.45 {index} (ref >0.99)
WBC: 3.3 10*3/uL — ABNORMAL LOW (ref 3.4–10.8)

## 2023-05-07 LAB — HCV INTERPRETATION

## 2023-05-07 LAB — AB SCR+ANTIBODY ID: Antibody Screen: POSITIVE — AB

## 2023-05-07 LAB — HEMOGLOBIN A1C
Est. average glucose Bld gHb Est-mCnc: 88 mg/dL
Hgb A1c MFr Bld: 4.7 % — ABNORMAL LOW (ref 4.8–5.6)

## 2023-05-08 LAB — T3, FREE: T3, Free: 3.4 pg/mL (ref 2.0–4.4)

## 2023-05-08 LAB — T4, FREE: Free T4: 1.1 ng/dL (ref 0.82–1.77)

## 2023-05-08 LAB — SPECIMEN STATUS REPORT

## 2023-05-10 LAB — CYTOLOGY - PAP
Comment: NEGATIVE
Diagnosis: NEGATIVE
High risk HPV: NEGATIVE

## 2023-05-12 ENCOUNTER — Other Ambulatory Visit: Payer: Self-pay

## 2023-05-12 ENCOUNTER — Inpatient Hospital Stay (HOSPITAL_COMMUNITY)
Admission: AD | Admit: 2023-05-12 | Discharge: 2023-05-13 | Disposition: A | Discharge status: Home / Self Care | Payer: Medicaid Other | Attending: Obstetrics & Gynecology | Admitting: Obstetrics & Gynecology

## 2023-05-12 DIAGNOSIS — O99891 Other specified diseases and conditions complicating pregnancy: Secondary | ICD-10-CM | POA: Insufficient documentation

## 2023-05-12 DIAGNOSIS — Z3A11 11 weeks gestation of pregnancy: Secondary | ICD-10-CM

## 2023-05-12 DIAGNOSIS — N949 Unspecified condition associated with female genital organs and menstrual cycle: Secondary | ICD-10-CM

## 2023-05-12 DIAGNOSIS — Z3A12 12 weeks gestation of pregnancy: Secondary | ICD-10-CM | POA: Insufficient documentation

## 2023-05-12 DIAGNOSIS — O219 Vomiting of pregnancy, unspecified: Secondary | ICD-10-CM | POA: Insufficient documentation

## 2023-05-12 LAB — URINALYSIS, ROUTINE W REFLEX MICROSCOPIC
Bilirubin Urine: NEGATIVE
Glucose, UA: NEGATIVE mg/dL
Hgb urine dipstick: NEGATIVE
Ketones, ur: 5 mg/dL — AB
Leukocytes,Ua: NEGATIVE
Nitrite: NEGATIVE
Protein, ur: NEGATIVE mg/dL
Specific Gravity, Urine: 1.024 (ref 1.005–1.030)
pH: 6 (ref 5.0–8.0)

## 2023-05-12 LAB — COMPREHENSIVE METABOLIC PANEL
ALT: 12 U/L (ref 0–44)
AST: 14 U/L — ABNORMAL LOW (ref 15–41)
Albumin: 3.2 g/dL — ABNORMAL LOW (ref 3.5–5.0)
Alkaline Phosphatase: 44 U/L (ref 38–126)
Anion gap: 8 (ref 5–15)
BUN: 11 mg/dL (ref 6–20)
CO2: 23 mmol/L (ref 22–32)
Calcium: 9.3 mg/dL (ref 8.9–10.3)
Chloride: 105 mmol/L (ref 98–111)
Creatinine, Ser: 0.76 mg/dL (ref 0.44–1.00)
GFR, Estimated: >60 mL/min (ref >60)
Glucose, Bld: 90 mg/dL (ref 70–99)
Potassium: 3.4 mmol/L — ABNORMAL LOW (ref 3.5–5.1)
Sodium: 136 mmol/L (ref 135–145)
Total Bilirubin: 0.4 mg/dL (ref 0.3–1.2)
Total Protein: 6.6 g/dL (ref 6.5–8.1)

## 2023-05-12 LAB — HORIZON CUSTOM: REPORT SUMMARY: NEGATIVE

## 2023-05-12 LAB — MAGNESIUM: Magnesium: 1.8 mg/dL (ref 1.7–2.4)

## 2023-05-12 MED ORDER — PROMETHAZINE HCL 25 MG RE SUPP
25.0000 mg | Freq: Once | RECTAL | Status: AC
  Administered 2023-05-13: 25 mg via RECTAL
  Filled 2023-05-12: qty 1

## 2023-05-12 MED ORDER — SCOPOLAMINE 1 MG/3DAYS TD PT72
1.0000 | MEDICATED_PATCH | Freq: Once | TRANSDERMAL | Status: DC
  Administered 2023-05-13: 1.5 mg via TRANSDERMAL
  Filled 2023-05-12: qty 1

## 2023-05-12 MED ORDER — IBUPROFEN 800 MG PO TABS
400.0000 mg | ORAL_TABLET | Freq: Once | ORAL | Status: AC
  Administered 2023-05-13: 400 mg via ORAL
  Filled 2023-05-12: qty 1

## 2023-05-12 NOTE — MAU Note (Signed)
.  Brandy [name] is a 27 y.o. at [redacted]w[redacted]d here in MAU reporting: having pelvic, lower abdominal, and back pain. States she's continued to have N/V - unable to eat or drink for 2 days. Reports taking her nausea medications, but they have not helped. Last took zofran 3 hours ago. Reports a "teeny" amount of spotting when wiping earlier today, but denies significant VB.   Onset of complaint: 2 days  Pain score: 10 - pelvic (sharp); 8 - abdomen (tender/sore); 10 - back (sore) Vitals:   05/12/23 1943  BP: 112/66  Pulse: 72  Resp: 18  Temp: 98.1 F (36.7 C)  SpO2: 100%     FHT:155 Lab orders placed from triage:  UA

## 2023-05-12 NOTE — MAU Provider Note (Signed)
Chief Complaint: Abdominal Pain, Back Pain, Pelvic Pain, and Emesis   Event Date/Time   First Provider Initiated Contact with Patient 05/12/23 2314        SUBJECTIVE HPI: Brandy [name] is a 27 y.o. [id number] at [redacted]w[redacted]d by LMP who presents to maternity admissions reporting vaginal, pelvic and low back pain for 2 days.  Also has had persistent nausea and vomiting. States pain is mostly in area of vagina and pubic bone. Worse with walking. Denies bleeding to me.   Zofran did not help nausea. . She denies urinary symptoms, h/a, dizziness, n/v, or fever/chills.     Pelvic Pain The patient's primary symptoms include pelvic pain. The patient's pertinent negatives include no genital itching, genital odor or vaginal bleeding. The current episode started in the past 7 days. The problem has been unchanged. She is pregnant. Associated symptoms include back pain. Pertinent negatives include no abdominal pain, chills, constipation, diarrhea, dysuria, fever, flank pain or frequency. Exacerbated by: walking.   RN Note: Brandy [name] is a 27 y.o. at [redacted]w[redacted]d here in MAU reporting: having pelvic, lower abdominal, and back pain. States she's continued to have N/V - unable to eat or drink for 2 days. Reports taking her nausea medications, but they have not helped. Last took zofran 3 hours ago. Reports a "teeny" amount of spotting when wiping earlier today, but denies significant VB.   Onset of complaint: 2 days  Pain score: 10 - pelvic (sharp); 8 -  Past Medical History:  Diagnosis Date   Chlamydia    Heart murmur    Diagnosed as a child   Migraine    Past Surgical History:  Procedure Laterality Date   NO PAST SURGERIES     Social History   Socioeconomic History   Marital status: Single    Spouse name: Not on file   Number of children: Not on file   Years of education: Not on file   Highest education level: Not on file  Occupational History   Not on file  Tobacco Use   Smoking status: Never    Smokeless tobacco: Never  Vaping Use   Vaping status: Never Used  Substance and Sexual Activity   Alcohol use: Yes   Drug use: No   Sexual activity: Yes    Birth control/protection: None, Condom  Other Topics Concern   Not on file  Social History Narrative   Not on file   Social Determinants of Health   Financial Resource Strain: Not at Risk (03/24/2023)   Received from [employer]    Financial Resource Strain: 1  Food Insecurity: Not at Risk (03/24/2023)   Received from [employer] Insecurity    Food: 1  Transportation Needs: Not at Risk (03/24/2023)   Received from [employer] Needs    Transportation: 1  Physical Activity: Not on File (02/03/2023)   Received from [facility]   Physical Activity    Physical Activity: 0  Stress: Not on File (02/03/2023)   Received from [facility]   Stress    Stress: 0  Social Connections: Not on File (04/06/2023)   Received from [facility]   Social Connections    Connectedness: 0  Intimate Partner Violence: Unknown (11/06/2021)   Received from [employer]   HITS    Physically Hurt: Not on file    Insult or Talk Down To: Not on file    Threaten Physical Harm: Not on file    Scream  or Curse: Not on file   No current facility-administered medications on file prior to encounter.   Current Outpatient Medications on File Prior to Encounter  Medication Sig Dispense Refill   Blood Pressure Monitoring DEVI 1 each by Does not apply route once a week. 1 each 0   diphenhydrAMINE (BENADRYL) 50 MG capsule Take 1 capsule (50 mg total) by mouth every 6 (six) hours as needed (Nausea). 100 capsule 1   famotidine (PEPCID) 20 MG tablet Take 1 tablet (20 mg total) by mouth 2 (two) times daily. (Patient not taking: Reported on 05/04/2023) 180 tablet 1   ondansetron (ZOFRAN-ODT) 4 MG disintegrating tablet Take 1 tablet (4 mg total) by mouth every 8 (eight) hours as needed for nausea or vomiting. 15 tablet 0   polyethylene glycol  (MIRALAX) 17 g packet Take 17 g by mouth daily. 14 each 0   Prenatal MV & Min w/FA-DHA (PRENATAL GUMMIES) 0.18-25 MG CHEW Chew by mouth.     promethazine (PHENERGAN) 25 MG tablet Take 1 tablet (25 mg total) by mouth every 6 (six) hours as needed for nausea or vomiting. (Patient not taking: Reported on 05/04/2023) 30 tablet 2   Allergies  Allergen Reactions   Kiwi Extract Swelling   Penicillins Hives and Other (See Comments)    Has patient had a PCN reaction causing immediate rash, facial/tongue/throat swelling, SOB or lightheadedness with hypotension: Yes Has patient had a PCN reaction causing severe rash involving mucus membranes or skin necrosis: No Has patient had a PCN reaction that required hospitalization No Has patient had a PCN reaction occurring within the last 10 years: No If all of the above answers are "NO", then may proceed with Cephalosporin use.    Lobster [Shellfish Allergy] Rash   Reglan [Metoclopramide] Diarrhea    I have reviewed patient's Past Medical Hx, Surgical Hx, Family Hx, Social Hx, medications and allergies.   ROS:  Review of Systems  Constitutional:  Negative for chills and fever.  Gastrointestinal:  Negative for abdominal pain, constipation and diarrhea.  Genitourinary:  Positive for pelvic pain. Negative for dysuria, flank pain and frequency.  Musculoskeletal:  Positive for back pain.   Review of Systems  Other systems negative   Physical Exam  Physical Exam Patient Vitals for the past 24 hrs:  BP Temp Temp src Pulse Resp SpO2 Height Weight  05/12/23 1943 112/66 98.1 F (36.7 C) Oral 72 18 100 % 5\' 4"  (1.626 m) 63.9 kg   Constitutional: Well-developed, well-nourished female in no acute distress.  Cardiovascular: normal rate Respiratory: normal effort GI: Abd soft, non-tender. No tenderness over pelvis.  + tenderness over symphysis pubis.   MS: Extremities nontender, no edema, normal ROM Neurologic: Alert and oriented x 4.  GU: Neg  CVAT.  PELVIC EXAM: Dilation: Closed Effacement (%): Thick Exam by:: Wynelle Bourgeois, CNM No tenderness in vagina No blood on exam.  LAB RESULTS Results for orders placed or performed during the hospital encounter of 05/12/23 (from the past 24 hour(s))  Urinalysis, Routine w reflex microscopic -Urine, Clean Catch     Status: Abnormal   Collection Time: 05/12/23  7:52 PM  Result Value Ref Range   Color, Urine YELLOW YELLOW   APPearance HAZY (A) CLEAR   Specific Gravity, Urine 1.024 1.005 - 1.030   pH 6.0 5.0 - 8.0   Glucose, UA NEGATIVE NEGATIVE mg/dL   Hgb urine dipstick NEGATIVE NEGATIVE   Bilirubin Urine NEGATIVE NEGATIVE   Ketones, ur 5 (A) NEGATIVE mg/dL  Protein, ur NEGATIVE NEGATIVE mg/dL   Nitrite NEGATIVE NEGATIVE   Leukocytes,Ua NEGATIVE NEGATIVE  Comprehensive metabolic panel     Status: Abnormal   Collection Time: 05/12/23  8:29 PM  Result Value Ref Range   Sodium 136 135 - 145 mmol/L   Potassium 3.4 (L) 3.5 - 5.1 mmol/L   Chloride 105 98 - 111 mmol/L   CO2 23 22 - 32 mmol/L   Glucose, Bld 90 70 - 99 mg/dL   BUN 11 6 - 20 mg/dL   Creatinine, Ser 1.47 0.44 - 1.00 mg/dL   Calcium 9.3 8.9 - 82.9 mg/dL   Total Protein 6.6 6.5 - 8.1 g/dL   Albumin 3.2 (L) 3.5 - 5.0 g/dL   AST 14 (L) 15 - 41 U/L   ALT 12 0 - 44 U/L   Alkaline Phosphatase 44 38 - 126 U/L   Total Bilirubin 0.4 0.3 - 1.2 mg/dL   GFR, Estimated >56 >21 mL/min   Anion gap 8 5 - 15  Magnesium     Status: None   Collection Time: 05/12/23  8:29 PM  Result Value Ref Range   Magnesium 1.8 1.7 - 2.4 mg/dL    B/Negative/-- (30/86 1011)  IMAGING US OB Comp Less 14 Wks  Result Date: 05/13/2023 CLINICAL DATA:  [id number] Vaginal pain 192906. Gestational age by last menstrual period 11 weeks and 5 days. Estimated due date by last menstrual period 11/27/2023. Last menstrual period 02/20/2023. EXAM: OBSTETRIC <14 WK ULTRASOUND TECHNIQUE: Transabdominal ultrasound was performed for evaluation of the gestation as  well as the maternal uterus and adnexal regions. COMPARISON:  Ultrasound Ob 04/06/2023 FINDINGS: Intrauterine gestational sac: Single Yolk sac:  Not Visualized. Embryo:  Visualized. Cardiac Activity: Visualized. Heart Rate: 158 bpm CRL:   56  mm   12 w 1 d                  [place] EDC: 11/24/2023 Subchorionic hemorrhage:  Small. Maternal uterus/adnexae: Right ovary is unremarkable. Left ovary not well visualized. IMPRESSION: 1. Single live intrauterine pregnancy with gestational age by ultrasound of 12 weeks and 1 day which is concordant with gestational age by last menstrual period of 11 weeks and 5 days. 2. Small subchorionic hemorrhage. Electronically Signed   By: Tish Frederickson M.D.   On: 05/13/2023 01:45     MAU Management/MDM: I have reviewed the triage vital signs and the nursing notes.   Pertinent labs & imaging results that were available during my care of the patient were reviewed by me and considered in my medical decision making (see chart for details).      I have reviewed her medical records including past results, notes and treatments. Medical, Surgical, and family history were reviewed.  Medications and recent lab tests were reviewed  Will check Ultrasound to rule out signs of cervix change or SAB, this was normal  Treatments in MAU included Dilaudid 1/2 mg for initial severe pain so she could tolerate [place]  this helped a lot.  Levsin given for possible bowel cramping.  Ibuprofen given for antinflammatory effect.  Phenergan, Scopolamine and Zofran given for nausea..    ASSESSMENT Single IUP at [redacted]w[redacted]d Symphysis pubis dysfunction Nausea and vomiting  PLAN Discharge home Rx Flexeril for relaxation of symphysis muscles May benefit from physical therapy Rx Scopolamine patch for nausea  Pt stable at time of discharge. Encouraged to return here if she develops worsening of symptoms, increase in pain, fever, or other concerning symptoms.  Wynelle Bourgeois CNM, MSN Certified  Nurse-Midwife 05/12/2023  11:14 PM

## 2023-05-13 ENCOUNTER — Inpatient Hospital Stay (HOSPITAL_COMMUNITY): Payer: Medicaid Other

## 2023-05-13 DIAGNOSIS — O219 Vomiting of pregnancy, unspecified: Secondary | ICD-10-CM

## 2023-05-13 DIAGNOSIS — N949 Unspecified condition associated with female genital organs and menstrual cycle: Secondary | ICD-10-CM | POA: Diagnosis not present

## 2023-05-13 DIAGNOSIS — Z3A11 11 weeks gestation of pregnancy: Secondary | ICD-10-CM | POA: Diagnosis not present

## 2023-05-13 MED ORDER — HYDROMORPHONE HCL 1 MG/ML IJ SOLN
0.5000 mg | Freq: Once | INTRAMUSCULAR | Status: AC
  Administered 2023-05-13: 0.5 mg via INTRAMUSCULAR
  Filled 2023-05-13: qty 1

## 2023-05-13 MED ORDER — ONDANSETRON 4 MG PO TBDP
4.0000 mg | ORAL_TABLET | Freq: Once | ORAL | Status: AC
  Administered 2023-05-13: 4 mg via ORAL
  Filled 2023-05-13: qty 1

## 2023-05-13 MED ORDER — HYOSCYAMINE SULFATE 0.125 MG SL SUBL
0.1250 mg | SUBLINGUAL_TABLET | Freq: Once | SUBLINGUAL | Status: AC
  Administered 2023-05-13: 0.125 mg via SUBLINGUAL
  Filled 2023-05-13: qty 1

## 2023-05-13 MED ORDER — CYCLOBENZAPRINE HCL 5 MG PO TABS
5.0000 mg | ORAL_TABLET | Freq: Once | ORAL | Status: DC
  Filled 2023-05-13: qty 1

## 2023-05-13 MED ORDER — CYCLOBENZAPRINE HCL 5 MG PO TABS: 5.0000 mg | ORAL_TABLET | Freq: Three times a day (TID) | ORAL | 0 refills | Status: DC | PRN

## 2023-05-13 MED ORDER — TRANSDERM-SCOP 1 MG/3DAYS TD PT72: 1.0000 | MEDICATED_PATCH | TRANSDERMAL | 12 refills | Status: DC

## 2023-05-13 NOTE — Discharge Instructions (Signed)
Your Care Instructions The pubis is the area where the two pubic bones meet in the front of the pelvis. They are joined by cartilage. Osteitis pubis is a condition caused by stress on this joint. It can cause pain, swelling, and tenderness right over the pubis. The pain may go into the groin area.  Osteitis pubis often happens when you overdo an activity or repeat the same activity day after day. It is most common in distance runners and soccer players.  Many things can cause groin pain. The doctor may use X-rays or other imaging tests to diagnose this condition.  Osteitis pubis will get better in time with home care. But it may take a few months. The doctor may recommend physiotherapy and exercises to stretch and strengthen your hip muscles. Sometimes a steroid shot can help reduce pain and swelling.  It is important to avoid the activity that caused this condition until the pain is gone. If you don't, you may have ongoing pain.  Follow-up care is a key part of your treatment and safety. Be sure to make and go to all appointments, and call your doctor or nurse advice line (811 in most provinces and territories) if you are having problems. It's also a good idea to know your test results and keep a list of the medicines you take.  How can you care for yourself at home? Ask your doctor if you can take an over-the-counter pain medicine, such as acetaminophen (Tylenol), ibuprofen (Advil, Motrin), or naproxen (Aleve). Be safe with medicines. Read and follow all instructions on the label. Put ice or a cold pack on the painful area for 10 to 20 minutes at a time. Try to do this every 1 to 2 hours for the next 3 days (when you are awake) or until the swelling goes down. Put a thin cloth between the ice and your skin. After 2 or 3 days, if your swelling is gone, apply heat. Put a warm water bottle, a heating pad set on low, or a warm cloth over the painful area. Do not go to sleep with a heating pad on your  skin. Do exercises and stretching as directed by your therapist or doctor. Return to your usual level of activity slowly. Don't do anything that makes your pain worse.

## 2023-06-02 ENCOUNTER — Other Ambulatory Visit: Payer: Self-pay

## 2023-06-02 ENCOUNTER — Inpatient Hospital Stay (HOSPITAL_COMMUNITY)
Admission: AD | Admit: 2023-06-02 | Discharge: 2023-06-02 | Disposition: A | Discharge status: Home / Self Care | Payer: Medicaid Other | Attending: Obstetrics and Gynecology | Admitting: Obstetrics and Gynecology

## 2023-06-02 DIAGNOSIS — R5383 Other fatigue: Secondary | ICD-10-CM | POA: Diagnosis present

## 2023-06-02 DIAGNOSIS — Z3A14 14 weeks gestation of pregnancy: Secondary | ICD-10-CM | POA: Insufficient documentation

## 2023-06-02 DIAGNOSIS — R42 Dizziness and giddiness: Secondary | ICD-10-CM | POA: Insufficient documentation

## 2023-06-02 DIAGNOSIS — O0990 Supervision of high risk pregnancy, unspecified, unspecified trimester: Secondary | ICD-10-CM

## 2023-06-02 DIAGNOSIS — R112 Nausea with vomiting, unspecified: Secondary | ICD-10-CM | POA: Diagnosis present

## 2023-06-02 DIAGNOSIS — O219 Vomiting of pregnancy, unspecified: Secondary | ICD-10-CM | POA: Insufficient documentation

## 2023-06-02 DIAGNOSIS — O26892 Other specified pregnancy related conditions, second trimester: Secondary | ICD-10-CM | POA: Insufficient documentation

## 2023-06-02 DIAGNOSIS — O2652 Maternal hypotension syndrome, second trimester: Secondary | ICD-10-CM | POA: Insufficient documentation

## 2023-06-02 DIAGNOSIS — M6281 Muscle weakness (generalized): Secondary | ICD-10-CM | POA: Diagnosis present

## 2023-06-02 LAB — URINALYSIS, ROUTINE W REFLEX MICROSCOPIC
Bilirubin Urine: NEGATIVE
Glucose, UA: NEGATIVE mg/dL
Hgb urine dipstick: NEGATIVE
Ketones, ur: 20 mg/dL — AB
Nitrite: NEGATIVE
Protein, ur: NEGATIVE mg/dL
Specific Gravity, Urine: 1.025 (ref 1.005–1.030)
pH: 5 (ref 5.0–8.0)

## 2023-06-02 LAB — GLUCOSE, CAPILLARY: Glucose-Capillary: 92 mg/dL (ref 70–99)

## 2023-06-02 MED ORDER — SCOPOLAMINE 1 MG/3DAYS TD PT72
1.0000 | MEDICATED_PATCH | TRANSDERMAL | Status: DC
  Administered 2023-06-02: 1.5 mg via TRANSDERMAL
  Filled 2023-06-02: qty 1

## 2023-06-02 MED ORDER — ONDANSETRON 4 MG PO TBDP
8.0000 mg | ORAL_TABLET | Freq: Once | ORAL | Status: AC
  Administered 2023-06-02: 8 mg via ORAL
  Filled 2023-06-02: qty 2

## 2023-06-02 NOTE — MAU Note (Signed)
Brandy [name] is a 26 y.o. at [redacted]w[redacted]d here in MAU reporting: she has N/V, weakness and fatigue.  States she's vomited 6-7 times in past 24 hours.  Reports doesn't have any other meds than patch for vomiting.  Denies VB. LMP: 02/20/2023 Onset of complaint: 2 days ago Pain score: 0 Vitals:   06/02/23 1338 06/02/23 1339  BP: 116/65 (!) 113/59  Pulse: (!) 104   Resp:    Temp:    SpO2: 97% 97%     FHT:147 bpm Lab orders placed from triage:   UA

## 2023-06-02 NOTE — MAU Provider Note (Signed)
History     CSN: [id number]  Arrival date and time: 06/02/23 1247   Event Date/Time   First Provider Initiated Contact with Patient 06/02/23 1504      Chief Complaint  Patient presents with   Weakness   Emesis   Fatigue    Brandy [name] is a 27 y.o. [id number] at [redacted]w[redacted]d who receives care at [facility].  She presents today for weakness and fatigue.  She states she has been weak for the past 2 days.  She reports when she stands she feels like she is "about to pass out."  She states when she lays down it "feels like the room is spinning."  She states she has been eating small meals, but hasn't really eaten for the past 3 days.  She reports she has been having issues with n/v, but using patch and Zofran with no issues until 3 days ago.  She reports she has vomited 6x/day for the past 3 days. She states she experienced similar, but milder symptoms in her first pregnancy but not in subsequent. She reports she had a HA, last week, that caused a decrease in appetite.  She states when that occurred her N/V increased. Patient denies sick individuals in the home, but reports her SO does have cough/sniffles r/t change in seasons. Patient currently eating chips and drinking gatorade while speaking with provider.    OB History     Gravida  4   Para  3   Term  0   Preterm  3   AB      Living  3      SAB      IAB      Ectopic      Multiple  0   Live Births  3           Past Medical History:  Diagnosis Date   Chlamydia    Heart murmur    Diagnosed as a child   Migraine     Past Surgical History:  Procedure Laterality Date   NO PAST SURGERIES      Family History  Problem Relation Age of Onset   Asthma Mother     Social History   Tobacco Use   Smoking status: Never   Smokeless tobacco: Never  Vaping Use   Vaping status: Never Used  Substance Use Topics   Alcohol use: Yes   Drug use: No    Allergies:  Allergies  Allergen Reactions   Kiwi Extract Swelling    Penicillins Hives and Other (See Comments)    Has patient had a PCN reaction causing immediate rash, facial/tongue/throat swelling, SOB or lightheadedness with hypotension: Yes Has patient had a PCN reaction causing severe rash involving mucus membranes or skin necrosis: No Has patient had a PCN reaction that required hospitalization No Has patient had a PCN reaction occurring within the last 10 years: No If all of the above answers are "NO", then may proceed with Cephalosporin use.    Lobster [Shellfish Allergy] Rash   Reglan [Metoclopramide] Diarrhea    Medications Prior to Admission  Medication Sig Dispense Refill Last Dose   Blood Pressure Monitoring DEVI 1 each by Does not apply route once a week. 1 each 0    cyclobenzaprine (FLEXERIL) 5 MG tablet Take 1 tablet (5 mg total) by mouth 3 (three) times daily as needed for muscle spasms. 30 tablet 0    diphenhydrAMINE (BENADRYL) 50 MG capsule Take 1 capsule (50 mg total) by mouth every  6 (six) hours as needed (Nausea). 100 capsule 1    famotidine (PEPCID) 20 MG tablet Take 1 tablet (20 mg total) by mouth 2 (two) times daily. (Patient not taking: Reported on 05/04/2023) 180 tablet 1    ondansetron (ZOFRAN-ODT) 4 MG disintegrating tablet Take 1 tablet (4 mg total) by mouth every 8 (eight) hours as needed for nausea or vomiting. 15 tablet 0    polyethylene glycol (MIRALAX) 17 g packet Take 17 g by mouth daily. 14 each 0    Prenatal MV & Min w/FA-DHA (PRENATAL GUMMIES) 0.18-25 MG CHEW Chew by mouth.      promethazine (PHENERGAN) 25 MG tablet Take 1 tablet (25 mg total) by mouth every 6 (six) hours as needed for nausea or vomiting. (Patient not taking: Reported on 05/04/2023) 30 tablet 2    scopolamine (TRANSDERM-SCOP) 1 MG/3DAYS Place 1 patch (1.5 mg total) onto the skin every 3 (three) days. 10 patch 12     Review of Systems  Constitutional:  Negative for chills and fever.  HENT:  Negative for congestion and sore throat.   Respiratory:   Negative for cough.   Gastrointestinal:  Positive for nausea and vomiting (12pm).  Neurological:  Positive for dizziness, syncope (Near with standing), light-headedness and headaches (One week ago x 3 days-unrelieved with tylenol. None curently).   Physical Exam   Blood pressure 107/70, pulse (!) 121, temperature 98.1 F (36.7 C), temperature source Oral, resp. rate 18, height 5\' 4"  (1.626 m), weight 64.3 kg, last menstrual period 02/20/2023, SpO2 98%.  Orthostatic Vitals for the past 48 hrs (Last 6 readings):  BP Pulse BP Location BP Method Patient Position (if appropriate) BP- Standing at 0 minutes Pulse- Standing at 0 minutes BP- Sitting Pulse- Sitting BP- Lying Pulse- Lying  06/02/23 1337 116/60 (!) 112 Right Arm Automatic Lying -- -- -- -- 116/60 112  06/02/23 1338 116/65 (!) 104 Right Arm Automatic Sitting -- -- 116/65 104 -- --  06/02/23 1339 (!) 113/59 (!) 126 Right Arm Automatic Standing 113/59 126 -- -- -- --  06/02/23 1342 107/70 (!) 121 Right Arm Automatic Standing -- -- -- -- -- --     Physical Exam Vitals reviewed.  Constitutional:      Appearance: Normal appearance.  HENT:     Head: Normocephalic and atraumatic.  Eyes:     Conjunctiva/sclera: Conjunctivae normal.  Cardiovascular:     Rate and Rhythm: Tachycardia present.  Pulmonary:     Effort: Pulmonary effort is normal. No respiratory distress.  Musculoskeletal:        General: Normal range of motion.  Neurological:     Mental Status: She is alert and oriented to person, place, and time.  Psychiatric:        Mood and Affect: Mood normal.        Behavior: Behavior normal.     MAU Course  Procedures Results for orders placed or performed during the hospital encounter of 06/02/23 (from the past 24 hour(s))  Urinalysis, Routine w reflex microscopic -Urine, Clean Catch     Status: Abnormal   Collection Time: 06/02/23  1:05 PM  Result Value Ref Range   Color, Urine YELLOW YELLOW   APPearance CLOUDY (A) CLEAR    Specific Gravity, Urine 1.025 1.005 - 1.030   pH 5.0 5.0 - 8.0   Glucose, UA NEGATIVE NEGATIVE mg/dL   Hgb urine dipstick NEGATIVE NEGATIVE   Bilirubin Urine NEGATIVE NEGATIVE   Ketones, ur 20 (A) NEGATIVE mg/dL   Protein,  ur NEGATIVE NEGATIVE mg/dL   Nitrite NEGATIVE NEGATIVE   Leukocytes,Ua SMALL (A) NEGATIVE   RBC / HPF 0-5 0 - 5 RBC/hpf   WBC, UA 11-20 0 - 5 WBC/hpf   Bacteria, UA RARE (A) NONE SEEN   Squamous Epithelial / HPF 21-50 0 - 5 /HPF   Mucus PRESENT   Glucose, capillary     Status: None   Collection Time: 06/02/23  2:03 PM  Result Value Ref Range   Glucose-Capillary 92 70 - 99 mg/dL    MDM Labs: UA, CBG Antiemetic Assessment and Plan  27 year old, [id number]  SIUP at 14.4 weeks Dizziness Nausea/Vomiting Fatigue  -Reviewed POC with patient. -Exam performed.  -Orthostatics completed and reviewed. -Informed that some drop in bp from sitting to standing that is notable. Discussed slow transitions between these positions. -CBG normal.  Discussed high protein foods to avoid hypoglycemia states.  -Patient offered and accepts antiemetic.  -Patient states she has been wearing current Scop patch for 2 days and reports she paid $60 for her prescription. -Informed that due to insurance she should not be paying more than $4 per script. -Encouraged to contact office if she comes across this in the future. -Discussed replacing scop patch today. -Informed that if symptoms worsen or onset of new flu-like symptoms to return for additional testing.  Cherre Robins 06/02/2023, 3:05 PM   Reassessment (3:50 PM) -Patient s/p medications. -No questions or concerns. -Keep appt as scheduled. -Encouraged to call primary office or return to MAU if symptoms worsen or with the onset of new symptoms. -Discharged to home in stable condition.  Cherre Robins MSN, CNM Advanced Practice Provider, Center for [employer]

## 2023-06-02 NOTE — MAU Note (Signed)
Eating snacks while waiting in lobby.

## 2023-06-02 NOTE — Discharge Instructions (Signed)

## 2023-06-03 ENCOUNTER — Other Ambulatory Visit (HOSPITAL_COMMUNITY)
Admission: RE | Admit: 2023-06-03 | Discharge: 2023-06-03 | Disposition: A | Discharge status: Home / Self Care | Payer: Medicaid Other | Source: Ambulatory Visit | Attending: Obstetrics and Gynecology | Admitting: Obstetrics and Gynecology

## 2023-06-03 ENCOUNTER — Ambulatory Visit: Payer: Medicaid Other | Admitting: Obstetrics and Gynecology

## 2023-06-03 VITALS — BP 102/67 | HR 71 | Wt 141.0 lb

## 2023-06-03 DIAGNOSIS — N760 Acute vaginitis: Secondary | ICD-10-CM

## 2023-06-03 DIAGNOSIS — R102 Pelvic and perineal pain: Secondary | ICD-10-CM

## 2023-06-03 DIAGNOSIS — O0990 Supervision of high risk pregnancy, unspecified, unspecified trimester: Secondary | ICD-10-CM

## 2023-06-03 DIAGNOSIS — B9689 Other specified bacterial agents as the cause of diseases classified elsewhere: Secondary | ICD-10-CM

## 2023-06-03 DIAGNOSIS — Z3A14 14 weeks gestation of pregnancy: Secondary | ICD-10-CM

## 2023-06-03 DIAGNOSIS — Z23 Encounter for immunization: Secondary | ICD-10-CM

## 2023-06-03 DIAGNOSIS — O0992 Supervision of high risk pregnancy, unspecified, second trimester: Secondary | ICD-10-CM

## 2023-06-03 DIAGNOSIS — B379 Candidiasis, unspecified: Secondary | ICD-10-CM

## 2023-06-03 MED ORDER — FAMOTIDINE 20 MG PO TABS
20.0000 mg | ORAL_TABLET | Freq: Every day | ORAL | 0 refills | Status: DC
Start: 2023-06-03 — End: 2023-08-30

## 2023-06-03 MED ORDER — TRANSDERM-SCOP 1 MG/3DAYS TD PT72: 1.0000 | MEDICATED_PATCH | TRANSDERMAL | 12 refills | Status: DC

## 2023-06-03 NOTE — Progress Notes (Signed)
   PRENATAL VISIT NOTE  Subjective:  Brandy [name] is a 27 y.o. [id number] at [redacted]w[redacted]d being seen today for ongoing prenatal care.  She is currently monitored for the following issues for this low-risk pregnancy and has HSV-2 (herpes simplex virus 2) infection; Rh negative state in antepartum period; and Supervision of high risk pregnancy, antepartum on their problem list.  Patient reports  went to MAU yesterday for nausea , last scope patch was going to cost $60, also having heartburn and right sided suprapubic pain. Denies bleeding, d/c, odor  Contractions: Not present. Vag. Bleeding: None.  Movement: Absent. Denies leaking of fluid.   The following portions of the patient's history were reviewed and updated as appropriate: allergies, current medications, past family history, past medical history, past social history, past surgical history and problem list.   Objective:   Vitals:   06/03/23 1530  BP: 102/67  Pulse: 71  Weight: 141 lb (64 kg)    Fetal Status: Fetal Heart Rate (bpm): 154   Movement: Absent     General:  Alert, oriented and cooperative. Patient is in no acute distress.  Skin: Skin is warm and dry. No rash noted.   Cardiovascular: Normal heart rate noted  Respiratory: Normal respiratory effort, no problems with respiration noted  Abdomen: Soft, gravid, appropriate for gestational age.  Pain/Pressure: Present     Pelvic: Cervical exam deferred        Extremities: Normal range of motion.  Edema: None  Mental Status: Normal mood and affect. Normal behavior. Normal judgment and thought content.   Assessment and Plan:  Pregnancy: [id number] at [redacted]w[redacted]d 1. Supervision of high risk pregnancy, antepartum BP and FHR normal Doing well, feeling regular movement   - Flu vaccine trivalent PF, 6mos and older(Flulaval,Afluria,Fluarix,Fluzone) - famotidine (PEPCID) 20 MG tablet; Take 1 tablet (20 mg total) by mouth at bedtime.  Dispense: 30 tablet; Refill: 0  2. [redacted] weeks gestation of  pregnancy New scope patch sent to pharmacy, notify if they try to charge 60$ again. Trial of pepcid to help relieve heartburn  - famotidine (PEPCID) 20 MG tablet; Take 1 tablet (20 mg total) by mouth at bedtime.  Dispense: 30 tablet; Refill: 0  3. Suprapubic pain U/a at MAU was negative. Will do swab today  - Cervicovaginal ancillary only   Preterm labor symptoms and general obstetric precautions including but not limited to vaginal bleeding, contractions, leaking of fluid and fetal movement were reviewed in detail with the patient. Please refer to After Visit Summary for other counseling recommendations.   Return in about 4 weeks (around 07/01/2023) for OB VISIT (MD or APP).  Future Appointments  Date Time Provider Department Center  07/05/2023  9:15 AM [facility] NURSE [facility]  07/05/2023  9:30 AM [facility] US1 [facility]    Albertine Grates, FNP

## 2023-06-04 ENCOUNTER — Other Ambulatory Visit: Payer: Self-pay | Admitting: Certified Nurse Midwife

## 2023-06-04 LAB — CERVICOVAGINAL ANCILLARY ONLY
Bacterial Vaginitis (gardnerella): POSITIVE — AB
Candida Glabrata: NEGATIVE
Candida Vaginitis: POSITIVE — AB
Comment: NEGATIVE
Comment: NEGATIVE
Comment: NEGATIVE

## 2023-06-07 MED ORDER — METRONIDAZOLE 500 MG PO TABS
500.0000 mg | ORAL_TABLET | Freq: Two times a day (BID) | ORAL | 0 refills | Status: AC
Start: 2023-06-07 — End: 2023-06-14

## 2023-06-07 MED ORDER — FLUCONAZOLE 150 MG PO TABS
150.0000 mg | ORAL_TABLET | Freq: Once | ORAL | 1 refills | Status: AC
Start: 2023-06-07 — End: 2023-06-07

## 2023-06-07 NOTE — Addendum Note (Signed)
Addended by: Sue Lush on: 06/07/2023 12:19 PM   Modules accepted: Orders

## 2023-06-21 DIAGNOSIS — Z8759 Personal history of other complications of pregnancy, childbirth and the puerperium: Secondary | ICD-10-CM | POA: Insufficient documentation

## 2023-06-24 ENCOUNTER — Other Ambulatory Visit: Payer: Self-pay | Admitting: Certified Nurse Midwife

## 2023-06-24 ENCOUNTER — Encounter: Payer: Self-pay | Admitting: Certified Nurse Midwife

## 2023-06-25 ENCOUNTER — Other Ambulatory Visit: Payer: Self-pay

## 2023-06-25 ENCOUNTER — Inpatient Hospital Stay (HOSPITAL_COMMUNITY)
Admission: AD | Admit: 2023-06-25 | Discharge: 2023-06-25 | Disposition: A | Discharge status: Home / Self Care | Payer: Medicaid Other | Attending: Obstetrics and Gynecology | Admitting: Obstetrics and Gynecology

## 2023-06-25 ENCOUNTER — Other Ambulatory Visit: Payer: Self-pay | Admitting: Obstetrics and Gynecology

## 2023-06-25 DIAGNOSIS — Z3A17 17 weeks gestation of pregnancy: Secondary | ICD-10-CM | POA: Diagnosis not present

## 2023-06-25 DIAGNOSIS — O26892 Other specified pregnancy related conditions, second trimester: Secondary | ICD-10-CM | POA: Diagnosis not present

## 2023-06-25 DIAGNOSIS — T7840XA Allergy, unspecified, initial encounter: Secondary | ICD-10-CM | POA: Diagnosis not present

## 2023-06-25 DIAGNOSIS — O219 Vomiting of pregnancy, unspecified: Secondary | ICD-10-CM | POA: Diagnosis present

## 2023-06-25 MED ORDER — LACTATED RINGERS IV BOLUS
1000.0000 mL | Freq: Once | INTRAVENOUS | Status: AC
  Administered 2023-06-25: 1000 mL via INTRAVENOUS

## 2023-06-25 MED ORDER — CLINDAMYCIN HCL 300 MG PO CAPS
300.0000 mg | ORAL_CAPSULE | Freq: Two times a day (BID) | ORAL | 0 refills | Status: DC
Start: 2023-06-25 — End: 2023-07-05

## 2023-06-25 MED ORDER — TRANSDERM-SCOP 1 MG/3DAYS TD PT72: 1.0000 | MEDICATED_PATCH | TRANSDERMAL | 4 refills | Status: DC

## 2023-06-25 MED ORDER — TERCONAZOLE 0.4 % VA CREA: 1.0000 | TOPICAL_CREAM | Freq: Every day | VAGINAL | 0 refills | Status: DC

## 2023-06-25 MED ORDER — TRANSDERM-SCOP 1 MG/3DAYS TD PT72: 1.0000 | MEDICATED_PATCH | TRANSDERMAL | 12 refills | Status: DC

## 2023-06-25 MED ORDER — FAMOTIDINE IN NACL 20-0.9 MG/50ML-% IV SOLN
20.0000 mg | Freq: Once | INTRAVENOUS | Status: AC
  Administered 2023-06-25: 20 mg via INTRAVENOUS
  Filled 2023-06-25: qty 50

## 2023-06-25 MED ORDER — ONDANSETRON HCL 4 MG/2ML IJ SOLN
4.0000 mg | Freq: Once | INTRAMUSCULAR | Status: AC
  Administered 2023-06-25: 4 mg via INTRAVENOUS
  Filled 2023-06-25: qty 2

## 2023-06-25 MED ORDER — DIPHENHYDRAMINE HCL 50 MG/ML IJ SOLN
25.0000 mg | Freq: Once | INTRAMUSCULAR | Status: AC
  Administered 2023-06-25: 25 mg via INTRAVENOUS
  Filled 2023-06-25: qty 1

## 2023-06-25 NOTE — Progress Notes (Signed)
Rx for scopolamine patch sent  Mariel Aloe, MD

## 2023-06-25 NOTE — MAU Note (Addendum)
..  Brandy [name] is a 26 y.o. at [redacted]w[redacted]d here in MAU reporting: Diagnosed  yeast infection and BV, took meds and feels like its not getting any better. C/o vaginal swelling, abnormal discharge and itching. She feels like she is having an allergic reaction to the antibiotics that started yesterday because she has a a scratchy throat, N/V and swollen lips after taking her antibiotics. +FM  Onset of complaint: 11/19 Pain score: 7 Vitals:   06/25/23 0904  BP: 108/74  Pulse: 86  Resp: 16  SpO2: 99%     FHT:141 Lab orders placed from triage:  UA

## 2023-06-25 NOTE — MAU Provider Note (Signed)
Chief Complaint:  Nausea, Emesis, Vaginal Itching, and Vaginal Discharge  HPI   Event Date/Time   First Provider Initiated Contact with Patient 06/25/23 0936     Brandy [name] is a 27 y.o. [id number] at [redacted]w[redacted]d who presents to maternity admissions reporting worsening yeast infection symptoms, continued nausea/vomiting, and what feels like an allergic reaction to her antibiotics. Was diagnosed with yeast and BV after her OB appt on 10/31 and prescribed fluconazole and metronidazole. Did not pick up meds until three days ago. Took the dose of fluconazole first, noted worsening itching/discharge and then began the metronidazole. After the first dose she noticed her throat itching but she took 2 additional days of the medication. On waking today, her throat, ears and eyes are itching and she feels like her face and mouth are burning. Feels itchy all over. No difficulties breathing.  Has been battling nausea/vomiting, the scopolamine patches work but she has been unable to pick up her last refill of patches because [facility] is charging $62. Needs them sent to [facility], has vomited once this morning, feels nauseated now. No vomiting since arrival to MAU.  No other physical complaints.  Pregnancy Course: Receives care at [facility]. Prenatal records reviewed. Of note, has multiple drug allergies with similar reactions (hives/rash)  Past Medical History:  Diagnosis Date   Chlamydia    Heart murmur    Diagnosed as a child   Migraine    OB History  Gravida Para Term Preterm AB Living  4 3 0 3   3  SAB IAB Ectopic Multiple Live Births        0 3    # Outcome Date GA Lbr Len/2nd Weight Sex Type Anes PTL Lv  4 Current           3 Preterm 11/22/16 [redacted]w[redacted]d 09:55 / 00:03 5 lb 8.7 oz (2.515 kg) M Vag-Spont EPI  LIV  2 Preterm 07/04/15 [redacted]w[redacted]d 13:46 / 00:16 6 lb 1.4 oz (2.76 kg) F Vag-Spont EPI  LIV  1 Preterm 12/27/13 [redacted]w[redacted]d  5 lb 12 oz (2.608 kg) M Vag-Spont EPI N LIV     Birth Comments: System Generated. Please  review and update pregnancy details.   Past Surgical History:  Procedure Laterality Date   NO PAST SURGERIES     Family History  Problem Relation Age of Onset   Asthma Mother    Social History   Tobacco Use   Smoking status: Never   Smokeless tobacco: Never  Vaping Use   Vaping status: Never Used  Substance Use Topics   Alcohol use: Yes   Drug use: No   Allergies  Allergen Reactions   Kiwi Extract Swelling   Metronidazole And Related Itching   Penicillins Hives and Other (See Comments)    Has patient had a PCN reaction causing immediate rash, facial/tongue/throat swelling, SOB or lightheadedness with hypotension: Yes Has patient had a PCN reaction causing severe rash involving mucus membranes or skin necrosis: No Has patient had a PCN reaction that required hospitalization No Has patient had a PCN reaction occurring within the last 10 years: No If all of the above answers are "NO", then may proceed with Cephalosporin use.    Lobster [Shellfish Allergy] Rash   Reglan [Metoclopramide] Diarrhea   No medications prior to admission.   I have reviewed patient's Past Medical Hx, Surgical Hx, Family Hx, Social Hx, medications and allergies.   ROS  Pertinent items noted in HPI and remainder of comprehensive ROS otherwise negative.  PHYSICAL EXAM  Patient Vitals for the past 24 hrs:  BP Temp src Pulse Resp SpO2 Height Weight  06/25/23 0904 108/74 Oral 86 16 99 % 5\' 4"  (1.626 m) 147 lb 8 oz (66.9 kg)   Constitutional: Well-developed, well-nourished female in no acute distress.  Skin: tiny bumpy rash noted on lower half of face making it feel rough/itchy, no rash noted elsewhere Cardiovascular: normal rate & rhythm, warm and well-perfused Respiratory: normal effort, no wheezing, no problems with respiration noted GI: Abd soft, non-tender, non-distended MS: Extremities nontender, no edema, normal ROM Neurologic: Alert and oriented x 4.  GU: no CVA tenderness Pelvic: exam  deferred  FHR: 141  Labs: No results found for this or any previous visit (from the past 24 hour(s)).  Imaging:  No results found.  MDM & MAU COURSE  MDM:  MAU Course: Orders Placed This Encounter  Procedures   Discharge patient   Meds ordered this encounter  Medications   lactated ringers bolus 1,000 mL   famotidine (PEPCID) IVPB 20 mg premix   diphenhydrAMINE (BENADRYL) injection 25 mg   ondansetron (ZOFRAN) injection 4 mg   DISCONTD: scopolamine (TRANSDERM-SCOP) 1 MG/3DAYS    Sig: Place 1 patch (1.5 mg total) onto the skin every 3 (three) days.    Dispense:  10 patch    Refill:  12   clindamycin (CLEOCIN) 300 MG capsule    Sig: Take 1 capsule (300 mg total) by mouth 2 (two) times daily. For seven days    Dispense:  14 capsule    Refill:  0   terconazole (TERAZOL 7) 0.4 % vaginal cream    Sig: Place 1 applicator vaginally at bedtime.    Dispense:  45 g    Refill:  0   Breathing stable, ordered fluids, anti-emetics and IV benadryl to stop itching.   On reassessment, nausea better as well as itching/discomfort. Sent in script for clindamycin but advised to stop taking if she feels itchy after the first dose or has any other kind of reaction. Sent correct scopolamine prescription to pharmacy.  ASSESSMENT   1. Allergic reaction to drug, initial encounter   2. Nausea and vomiting during pregnancy   3. [redacted] weeks gestation of pregnancy    PLAN  Discharge home in stable condition with return precautions.     Follow-up Information     Center for [facility] Follow up.   Specialty: Obstetrics and Gynecology Contact information: [address] [place] [address] [phone]                Allergies as of 06/25/2023       Reactions   Kiwi Extract Swelling   Metronidazole And Related Itching   Penicillins Hives, Other (See Comments)   Has patient had a PCN reaction causing immediate rash,  facial/tongue/throat swelling, SOB or lightheadedness with hypotension: Yes Has patient had a PCN reaction causing severe rash involving mucus membranes or skin necrosis: No Has patient had a PCN reaction that required hospitalization No Has patient had a PCN reaction occurring within the last 10 years: No If all of the above answers are "NO", then may proceed with Cephalosporin use.   Lobster [shellfish Allergy] Rash   Reglan [metoclopramide] Diarrhea        Medication List     STOP taking these medications    Transderm-Scop 1 MG/3DAYS Generic drug: scopolamine       TAKE these medications  Blood Pressure Monitoring Devi 1 each by Does not apply route once a week.   clindamycin 300 MG capsule Commonly known as: CLEOCIN Take 1 capsule (300 mg total) by mouth 2 (two) times daily. For seven days   cyclobenzaprine 5 MG tablet Commonly known as: FLEXERIL Take 1 tablet (5 mg total) by mouth 3 (three) times daily as needed for muscle spasms.   diphenhydrAMINE 50 MG capsule Commonly known as: BENADRYL Take 1 capsule (50 mg total) by mouth every 6 (six) hours as needed (Nausea).   famotidine 20 MG tablet Commonly known as: PEPCID Take 1 tablet (20 mg total) by mouth at bedtime.   ondansetron 4 MG disintegrating tablet Commonly known as: ZOFRAN-ODT DISSOLVE 1 TABLET IN THE MOUTH EVERY 8 HOURS AS NEEDED FOR NAUSEA OR VOMITING   polyethylene glycol 17 g packet Commonly known as: MiraLax Take 17 g by mouth daily.   Prenatal Gummies 0.18-25 MG Chew Chew by mouth.   terconazole 0.4 % vaginal cream Commonly known as: TERAZOL 7 Place 1 applicator vaginally at bedtime.        Edd Arbour, CNM, MSN, IBCLC Certified Nurse Midwife, [facility]

## 2023-06-28 ENCOUNTER — Telehealth: Payer: Self-pay | Admitting: Lactation Services

## 2023-06-28 NOTE — Telephone Encounter (Signed)
Received PA request for Scopolamine Patches. Spoke with Pharmacist. Reviewed need to run as brand for [employer]. Was not able to run through. Gave NDC numbers of [id number], [id number], [id number]. She reports one of these did work.   They report they received a message that is too early to refill so patient most likely filled somewhere else.

## 2023-06-29 MED ORDER — ONDANSETRON 4 MG PO TBDP: 4.0000 mg | ORAL_TABLET | Freq: Three times a day (TID) | ORAL | 1 refills | Status: DC | PRN

## 2023-07-03 ENCOUNTER — Encounter (HOSPITAL_COMMUNITY): Payer: Self-pay | Admitting: Obstetrics & Gynecology

## 2023-07-03 ENCOUNTER — Inpatient Hospital Stay (HOSPITAL_COMMUNITY)
Admission: AD | Admit: 2023-07-03 | Discharge: 2023-07-03 | Disposition: A | Discharge status: Home / Self Care | Payer: Medicaid Other | Attending: Obstetrics & Gynecology | Admitting: Obstetrics & Gynecology

## 2023-07-03 ENCOUNTER — Inpatient Hospital Stay (HOSPITAL_COMMUNITY): Payer: Medicaid Other

## 2023-07-03 DIAGNOSIS — O26892 Other specified pregnancy related conditions, second trimester: Secondary | ICD-10-CM | POA: Insufficient documentation

## 2023-07-03 DIAGNOSIS — X58XXXA Exposure to other specified factors, initial encounter: Secondary | ICD-10-CM | POA: Diagnosis not present

## 2023-07-03 DIAGNOSIS — Z3A19 19 weeks gestation of pregnancy: Secondary | ICD-10-CM | POA: Diagnosis not present

## 2023-07-03 DIAGNOSIS — M7918 Myalgia, other site: Secondary | ICD-10-CM | POA: Diagnosis not present

## 2023-07-03 DIAGNOSIS — O321XX0 Maternal care for breech presentation, not applicable or unspecified: Secondary | ICD-10-CM | POA: Diagnosis not present

## 2023-07-03 DIAGNOSIS — R102 Pelvic and perineal pain: Secondary | ICD-10-CM | POA: Diagnosis not present

## 2023-07-03 DIAGNOSIS — O99891 Other specified diseases and conditions complicating pregnancy: Secondary | ICD-10-CM | POA: Diagnosis not present

## 2023-07-03 DIAGNOSIS — W109XXA Fall (on) (from) unspecified stairs and steps, initial encounter: Secondary | ICD-10-CM | POA: Diagnosis not present

## 2023-07-03 DIAGNOSIS — O0990 Supervision of high risk pregnancy, unspecified, unspecified trimester: Secondary | ICD-10-CM

## 2023-07-03 DIAGNOSIS — R109 Unspecified abdominal pain: Secondary | ICD-10-CM | POA: Diagnosis not present

## 2023-07-03 LAB — COMPREHENSIVE METABOLIC PANEL
ALT: 19 U/L (ref 0–44)
AST: 20 U/L (ref 15–41)
Albumin: 3.1 g/dL — ABNORMAL LOW (ref 3.5–5.0)
Alkaline Phosphatase: 44 U/L (ref 38–126)
Anion gap: 7 (ref 5–15)
BUN: 7 mg/dL (ref 6–20)
CO2: 23 mmol/L (ref 22–32)
Calcium: 8.8 mg/dL — ABNORMAL LOW (ref 8.9–10.3)
Chloride: 106 mmol/L (ref 98–111)
Creatinine, Ser: 0.7 mg/dL (ref 0.44–1.00)
GFR, Estimated: >60 mL/min (ref >60)
Glucose, Bld: 78 mg/dL (ref 70–99)
Potassium: 3.8 mmol/L (ref 3.5–5.1)
Sodium: 136 mmol/L (ref 135–145)
Total Bilirubin: 0.6 mg/dL (ref <1.2)
Total Protein: 6.8 g/dL (ref 6.5–8.1)

## 2023-07-03 LAB — CBC WITH DIFFERENTIAL/PLATELET
Abs Immature Granulocytes: 0.01 10*3/uL (ref 0.00–0.07)
Basophils Absolute: 0 10*3/uL (ref 0.0–0.1)
Basophils Relative: 1 %
Eosinophils Absolute: 0 10*3/uL (ref 0.0–0.5)
Eosinophils Relative: 0 %
HCT: 35.3 % — ABNORMAL LOW (ref 36.0–46.0)
Hemoglobin: 11.9 g/dL — ABNORMAL LOW (ref 12.0–15.0)
Immature Granulocytes: 0 %
Lymphocytes Relative: 19 %
Lymphs Abs: 1.3 10*3/uL (ref 0.7–4.0)
MCH: 33.5 pg (ref 26.0–34.0)
MCHC: 33.7 g/dL (ref 30.0–36.0)
MCV: 99.4 fL (ref 80.0–100.0)
Monocytes Absolute: 0.3 10*3/uL (ref 0.1–1.0)
Monocytes Relative: 5 %
Neutro Abs: 4.9 10*3/uL (ref 1.7–7.7)
Neutrophils Relative %: 75 %
Platelets: 270 10*3/uL (ref 150–400)
RBC: 3.55 MIL/uL — ABNORMAL LOW (ref 3.87–5.11)
RDW: 12.1 % (ref 11.5–15.5)
WBC: 6.6 10*3/uL (ref 4.0–10.5)
nRBC: 0 % (ref 0.0–0.2)

## 2023-07-03 MED ORDER — ACETAMINOPHEN 500 MG PO TABS
1000.0000 mg | ORAL_TABLET | Freq: Once | ORAL | Status: AC
  Administered 2023-07-03: 1000 mg via ORAL
  Filled 2023-07-03: qty 2

## 2023-07-03 MED ORDER — RHO D IMMUNE GLOBULIN 1500 UNIT/2ML IJ SOSY
300.0000 ug | PREFILLED_SYRINGE | Freq: Once | INTRAMUSCULAR | Status: DC
  Filled 2023-07-03: qty 2

## 2023-07-03 MED ORDER — CYCLOBENZAPRINE HCL 5 MG PO TABS
10.0000 mg | ORAL_TABLET | Freq: Once | ORAL | Status: AC
  Administered 2023-07-03: 10 mg via ORAL
  Filled 2023-07-03: qty 2

## 2023-07-03 NOTE — MAU Note (Signed)
.  Brandy [name] is a 27 y.o. at [redacted]w[redacted]d here in MAU reporting: about 45 minutes ago she was going down some steps and missed the last few steps and fell on her abd. Reports her pelvis, abd and hips hurts. Ambulatory without difficulty to mau room 123. After voiding pt denies any vaginal bleeding.  Onset of complaint: 45 minutes ago. Pain score: 10/10 hips, lower abd and pelvis  Lab orders placed from triage :   ua

## 2023-07-03 NOTE — MAU Provider Note (Cosign Needed Addendum)
History     CSN: [id number]  Arrival date and time: 07/03/23 1503   None     Chief Complaint  Patient presents with   Fall   Pelvic Pain   Brandy [name] is a 27 y.o. at [redacted]w[redacted]d presents reporting: she fell down about 5-6 steps about 40 minutes ago. She reports she fell on her abdomen. She denies vaginal bleeding, and endorses regular fetal movement. Reports intense pain in pelvis, abdomen and hips. This pain is 10/10. Patient was able to ambulate to MAU room.   Fall The accident occurred Less than 1 hour ago. Pertinent negatives include no abdominal pain, fever, nausea or vomiting.  Pelvic Pain The patient's primary symptoms include pelvic pain. Pertinent negatives include no abdominal pain, chills, constipation, diarrhea, fever, flank pain, frequency, nausea, urgency or vomiting.    OB History     Gravida  4   Para  3   Term  0   Preterm  3   AB      Living  3      SAB      IAB      Ectopic      Multiple  0   Live Births  3           Past Medical History:  Diagnosis Date   Chlamydia    Heart murmur    Diagnosed as a child   Migraine     Past Surgical History:  Procedure Laterality Date   NO PAST SURGERIES      Family History  Problem Relation Age of Onset   Asthma Mother     Social History   Tobacco Use   Smoking status: Never   Smokeless tobacco: Never  Vaping Use   Vaping status: Never Used  Substance Use Topics   Alcohol use: Not Currently   Drug use: No    Allergies:  Allergies  Allergen Reactions   Kiwi Extract Swelling   Metronidazole And Related Itching   Penicillins Hives and Other (See Comments)    Has patient had a PCN reaction causing immediate rash, facial/tongue/throat swelling, SOB or lightheadedness with hypotension: Yes Has patient had a PCN reaction causing severe rash involving mucus membranes or skin necrosis: No Has patient had a PCN reaction that required hospitalization No Has patient had a PCN  reaction occurring within the last 10 years: No If all of the above answers are "NO", then may proceed with Cephalosporin use.    Lobster [Shellfish Allergy] Rash   Reglan [Metoclopramide] Diarrhea    Medications Prior to Admission  Medication Sig Dispense Refill Last Dose   clindamycin (CLEOCIN) 300 MG capsule Take 1 capsule (300 mg total) by mouth 2 (two) times daily. For seven days 14 capsule 0 07/03/2023   cyclobenzaprine (FLEXERIL) 5 MG tablet Take 1 tablet (5 mg total) by mouth 3 (three) times daily as needed for muscle spasms. 30 tablet 0 Past Week   famotidine (PEPCID) 20 MG tablet Take 1 tablet (20 mg total) by mouth at bedtime. 30 tablet 0 07/02/2023   polyethylene glycol (MIRALAX) 17 g packet Take 17 g by mouth daily. 14 each 0 Past Week   Prenatal MV & Min w/FA-DHA (PRENATAL GUMMIES) 0.18-25 MG CHEW Chew by mouth.   07/03/2023   Blood Pressure Monitoring DEVI 1 each by Does not apply route once a week. (Patient not taking: Reported on 06/03/2023) 1 each 0    diphenhydrAMINE (BENADRYL) 50 MG capsule Take 1 capsule (50 mg  total) by mouth every 6 (six) hours as needed (Nausea). 100 capsule 1 Unknown   ondansetron (ZOFRAN-ODT) 4 MG disintegrating tablet Take 1 tablet (4 mg total) by mouth every 8 (eight) hours as needed for nausea or vomiting. 15 tablet 1    scopolamine (TRANSDERM-SCOP) 1 MG/3DAYS Place 1 patch (1.5 mg total) onto the skin every 3 (three) days. 10 patch 4    terconazole (TERAZOL 7) 0.4 % vaginal cream Place 1 applicator vaginally at bedtime. 45 g 0     Review of Systems  Constitutional:  Negative for chills, fatigue, fever and unexpected weight change.  Respiratory:  Negative for cough and shortness of breath.   Cardiovascular:  Negative for chest pain and palpitations.  Gastrointestinal:  Negative for abdominal pain, constipation, diarrhea, nausea and vomiting.  Genitourinary:  Positive for pelvic pain. Negative for difficulty urinating, flank pain, frequency and  urgency.  Musculoskeletal:  Positive for myalgias.   Physical Exam   Blood pressure 106/68, pulse 98, temperature 99.2 F (37.3 C), resp. rate 16, height 5\' 4"  (1.626 m), weight 66.7 kg, last menstrual period 02/20/2023, SpO2 98%.  Physical Exam Vitals reviewed.  Constitutional:      Appearance: Normal appearance.  HENT:     Head: Normocephalic.  Cardiovascular:     Pulses: Normal pulses.  Pulmonary:     Effort: Pulmonary effort is normal.  Skin:    General: Skin is warm and dry.     Capillary Refill: Capillary refill takes less than 2 seconds.  Neurological:     Mental Status: She is alert and oriented to person, place, and time.  Psychiatric:        Mood and Affect: Mood normal.        Behavior: Behavior normal.        Thought Content: Thought content normal.        Judgment: Judgment normal.    MAU Course   Results for orders placed or performed during the hospital encounter of 07/03/23 (from the past 24 hour(s))  CBC with Differential/Platelet     Status: Abnormal   Collection Time: 07/03/23  4:53 PM  Result Value Ref Range   WBC 6.6 4.0 - 10.5 K/uL   RBC 3.55 (L) 3.87 - 5.11 MIL/uL   Hemoglobin 11.9 (L) 12.0 - 15.0 g/dL   HCT 95.6 (L) 21.3 - 08.6 %   MCV 99.4 80.0 - 100.0 fL   MCH 33.5 26.0 - 34.0 pg   MCHC 33.7 30.0 - 36.0 g/dL   RDW 57.8 46.9 - 62.9 %   Platelets 270 150 - 400 K/uL   nRBC 0.0 0.0 - 0.2 %   Neutrophils Relative % 75 %   Neutro Abs 4.9 1.7 - 7.7 K/uL   Lymphocytes Relative 19 %   Lymphs Abs 1.3 0.7 - 4.0 K/uL   Monocytes Relative 5 %   Monocytes Absolute 0.3 0.1 - 1.0 K/uL   Eosinophils Relative 0 %   Eosinophils Absolute 0.0 0.0 - 0.5 K/uL   Basophils Relative 1 %   Basophils Absolute 0.0 0.0 - 0.1 K/uL   Immature Granulocytes 0 %   Abs Immature Granulocytes 0.01 0.00 - 0.07 K/uL  Comprehensive metabolic panel     Status: Abnormal   Collection Time: 07/03/23  4:53 PM  Result Value Ref Range   Sodium 136 135 - 145 mmol/L   Potassium  3.8 3.5 - 5.1 mmol/L   Chloride 106 98 - 111 mmol/L   CO2 23 22 - 32  mmol/L   Glucose, Bld 78 70 - 99 mg/dL   BUN 7 6 - 20 mg/dL   Creatinine, Ser 2.13 0.44 - 1.00 mg/dL   Calcium 8.8 (L) 8.9 - 10.3 mg/dL   Total Protein 6.8 6.5 - 8.1 g/dL   Albumin 3.1 (L) 3.5 - 5.0 g/dL   AST 20 15 - 41 U/L   ALT 19 0 - 44 U/L   Alkaline Phosphatase 44 38 - 126 U/L   Total Bilirubin 0.6 <1.2 mg/dL   GFR, Estimated >08 >65 mL/min   Anion gap 7 5 - 15  Rh IG workup (includes ABO/Rh)     Status: None   Collection Time: 07/03/23  6:51 PM  Result Value Ref Range   Gestational Age(Wks) 19    ABO/RH(D) B NEG    Antibody Screen POS    Antibody Identification      ANTI D Performed at [facility], [address] [place], [place] [address]    [place] MFM OB LIMITED  Result Date: 07/03/2023 ----------------------------------------------------------------------  OBSTETRICS REPORT                        (Signed Final 07/03/2023 07:29 pm) ---------------------------------------------------------------------- Patient Info  ID #:       [id number]                          D.O.B.:  14-Feb-1996 (27 yrs)(F)  Name:       Cleta Alberts Frank                 Visit Date: 07/03/2023 05:15 pm ---------------------------------------------------------------------- Performed By  Attending:        Lin Landsman      Referred By:       [facility] MAU/Triage                    MD  Performed By:     Isabel Caprice        [facility] ---------------------------------------------------------------------- Orders  #  Description                           Code        Ordered By  1  [place] MFM OB LIMITED                     24401.02    Dosia Yodice [place] ----------------------------------------------------------------------  #  Order #                     Accession #                Episode #  1  [id number]                   [id number]                  [id number] ---------------------------------------------------------------------- Indications  [redacted] weeks gestation of pregnancy  Z3A.19  Abdominal pain in pregnancy (fall)              O99.89 ---------------------------------------------------------------------- Fetal Evaluation  Num Of Fetuses:          1  Fetal Heart Rate(bpm):   149  Cardiac Activity:        Observed  Presentation:            Breech  Placenta:                Posterior  Amniotic Fluid  AFI FV:      Within normal limits                              Largest Pocket(cm)                              5. ---------------------------------------------------------------------- OB History  Gravidity:    4         Term:   3 ---------------------------------------------------------------------- Gestational Age  LMP:           19w 0d        Date:  02/20/23                  EDD:   11/27/23  Best:          19w 0d     Det. By:  LMP  (02/20/23)          EDD:   11/27/23 ---------------------------------------------------------------------- Anatomy  Diaphragm:             Appears normal         Cord Vessels:           Appears normal (3                                                                        vessel cord)  Stomach:               Appears normal, left   Kidneys:                Appear normal                         sided  Abdomen:               Appears normal         Bladder:                Appears normal ---------------------------------------------------------------------- Impression  Limited exam due to maternal abdominal pain  Good fetal movement and amniotic fluid volume  No evidence of placental abruption or previal ---------------------------------------------------------------------- Recommendations  Clinical correlation recommended. ----------------------------------------------------------------------               Lin Landsman, MD Electronically Signed Final Report   07/03/2023 07:29 pm  ----------------------------------------------------------------------    MDM PE Labs: CBC, KLB, RhIG workup, [place],   Assessment and Plan  27 yo G4 P0303  SIUP at 19.0weeks Musculoskeletal pain from fall. Pregnancy workup benign for signs of abruption or previa.   - Exam findings discussed.  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  - Strict bleeding precautions given.  - Needs pregnancy support belt in office.  - Message sent to [facility] provider patient scheduled with 07/05/23 in regard to Anti-D antibody.  - Referral to pelvic PT sent.  - Return to MAU PRN. - Discharged home in stable condition.    Richardson Landry MSN, CNM 07/03/2023, 10:31 PM

## 2023-07-04 LAB — KLEIHAUER-BETKE STAIN
# Vials RhIg: 2
Fetal Cells %: 0.5 %
Quantitation Fetal Hemoglobin: 0.005 mL

## 2023-07-05 ENCOUNTER — Other Ambulatory Visit: Payer: Self-pay

## 2023-07-05 ENCOUNTER — Ambulatory Visit: Payer: Medicaid Other | Attending: Certified Nurse Midwife

## 2023-07-05 ENCOUNTER — Ambulatory Visit: Payer: Medicaid Other | Attending: Maternal & Fetal Medicine | Admitting: Maternal & Fetal Medicine

## 2023-07-05 ENCOUNTER — Encounter: Payer: Self-pay | Admitting: *Deleted

## 2023-07-05 ENCOUNTER — Ambulatory Visit (INDEPENDENT_AMBULATORY_CARE_PROVIDER_SITE_OTHER): Payer: Medicaid Other | Admitting: Obstetrics and Gynecology

## 2023-07-05 ENCOUNTER — Other Ambulatory Visit: Payer: Self-pay | Admitting: Certified Nurse Midwife

## 2023-07-05 ENCOUNTER — Other Ambulatory Visit: Payer: Self-pay | Admitting: *Deleted

## 2023-07-05 ENCOUNTER — Ambulatory Visit: Payer: Medicaid Other | Admitting: *Deleted

## 2023-07-05 ENCOUNTER — Ambulatory Visit: Payer: Medicaid Other

## 2023-07-05 VITALS — BP 94/67 | HR 79

## 2023-07-05 VITALS — BP 100/66 | HR 80 | Wt 145.1 lb

## 2023-07-05 DIAGNOSIS — O358XX0 Maternal care for other (suspected) fetal abnormality and damage, not applicable or unspecified: Secondary | ICD-10-CM | POA: Diagnosis not present

## 2023-07-05 DIAGNOSIS — O26892 Other specified pregnancy related conditions, second trimester: Secondary | ICD-10-CM

## 2023-07-05 DIAGNOSIS — Z3A19 19 weeks gestation of pregnancy: Secondary | ICD-10-CM

## 2023-07-05 DIAGNOSIS — O361920 Maternal care for other isoimmunization, second trimester, not applicable or unspecified: Secondary | ICD-10-CM

## 2023-07-05 DIAGNOSIS — O09292 Supervision of pregnancy with other poor reproductive or obstetric history, second trimester: Secondary | ICD-10-CM

## 2023-07-05 DIAGNOSIS — O09212 Supervision of pregnancy with history of pre-term labor, second trimester: Secondary | ICD-10-CM | POA: Diagnosis not present

## 2023-07-05 DIAGNOSIS — R109 Unspecified abdominal pain: Secondary | ICD-10-CM

## 2023-07-05 DIAGNOSIS — Z6791 Unspecified blood type, Rh negative: Secondary | ICD-10-CM

## 2023-07-05 DIAGNOSIS — Z3182 Encounter for Rh incompatibility status: Secondary | ICD-10-CM

## 2023-07-05 DIAGNOSIS — O0990 Supervision of high risk pregnancy, unspecified, unspecified trimester: Secondary | ICD-10-CM | POA: Diagnosis present

## 2023-07-05 DIAGNOSIS — Z8759 Personal history of other complications of pregnancy, childbirth and the puerperium: Secondary | ICD-10-CM | POA: Insufficient documentation

## 2023-07-05 DIAGNOSIS — B009 Herpesviral infection, unspecified: Secondary | ICD-10-CM | POA: Diagnosis present

## 2023-07-05 DIAGNOSIS — O26899 Other specified pregnancy related conditions, unspecified trimester: Secondary | ICD-10-CM

## 2023-07-05 DIAGNOSIS — O360120 Maternal care for anti-D [Rh] antibodies, second trimester, not applicable or unspecified: Secondary | ICD-10-CM

## 2023-07-05 DIAGNOSIS — O09899 Supervision of other high risk pregnancies, unspecified trimester: Secondary | ICD-10-CM

## 2023-07-05 DIAGNOSIS — O98412 Viral hepatitis complicating pregnancy, second trimester: Secondary | ICD-10-CM

## 2023-07-05 NOTE — Progress Notes (Signed)
   Patient information  Patient Name: Brandy [name]  Patient MRN:   [id number]  Referring practice: MFM Referring Provider: [facility])  MFM CONSULT  Brandy [name] is a 27 y.o. [phone] at [redacted]w[redacted]d here for ultrasound and consultation.   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  She reports that none of her previous children required a blood transfusion but they did have significant jaundice.  Notably this pregnancy was achieved with a different father.  I discussed that based on her blood work she has anti-D antibodies.  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  I discussed the concern for fetal anemia that can be life-threatening if not monitored appropriately.  We discussed the typical prenatal course including four options: Paternal antigen testing, cell free DNA for fetal blood typing, amniocentesis for fetal blood typing and middle cerebral artery monitoring for fetal anemia.  Since the patient already completed a panorama test for aneuploidy, she elected to have the fetal Rh blood typing added to the order.  She will continue MCA Dopplers until these results have finalized.  Sonographic findings Single intrauterine pregnancy at 19w 2d. Fetal cardiac activity:  Observed and appears normal. Presentation: Breech. The anatomic structures that were well seen appear normal. There is a CPC which is a normal variant. Due to poor acoustic windows some structures remain suboptimally visualized. Fetal biometry shows the estimated fetal weight at the 77 percentile. Amniotic fluid:  MVP: 4.29 cm. Placenta: Posterior. Adnexa: No abnormality visualized. Cervical length: 3.2 cm.  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, single or unspecified fetus  Rh negative state in antepartum period  Plan - Fetal RH blood  typing through Panorama  - F/u in 2 weeks for limited US and MCA dopplers  Review of Systems: A review of systems was performed and was negative except per HPI   Vitals and Physical Exam    07/05/2023   11:44 AM 07/05/2023    9:22 AM 07/03/2023   10:51 PM  Vitals with BMI  Weight 145 lbs 2 oz    BMI 24.89    Systolic 100 94 107  Diastolic 66 67 66  Pulse 80 79 94  Sitting comfortably on the sonogram table Nonlabored breathing Normal rate and rhythm Abdomen is nontender  Past pregnancies OB History  Gravida Para Term Preterm AB Living  4 3 0 3   3  SAB IAB Ectopic Multiple Live Births        0 3    # Outcome Date GA Lbr Len/2nd Weight Sex Type Anes PTL Lv  4 Current           3 Preterm 11/22/16 [redacted]w[redacted]d 09:55 / 00:03 2.515 kg M Vag-Spont EPI  LIV  2 Preterm 07/04/15 [redacted]w[redacted]d 13:46 / 00:16 2.76 kg F Vag-Spont EPI  LIV  1 Preterm 12/27/13 [redacted]w[redacted]d  2.608 kg M Vag-Spont EPI N LIV     Birth Comments: System Generated. Please review and update pregnancy details.    I spent 45 minutes reviewing the patients chart, including labs and images as well as counseling the patient about her medical conditions. Greater than 50% of the time was spent in direct face-to-face patient counseling.  Braxton Feathers  MFM, [facility]   07/05/2023  4:08 PM

## 2023-07-05 NOTE — Progress Notes (Signed)
   PRENATAL VISIT NOTE  Subjective:  Brandy [name] is a 27 y.o. [id number] at [redacted]w[redacted]d being seen today for ongoing prenatal care.  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  Patient doing well with no acute concerns today. She reports  abdominal/pelvic pain especially with movement .  Contractions: Irritability. Vag. Bleeding: None.  Movement: Present. Denies leaking of fluid.   The following portions of the patient's history were reviewed and updated as appropriate: allergies, current medications, past family history, past medical history, past social history, past surgical history and problem list. Problem list updated.  Objective:   Vitals:   07/05/23 1144  BP: 100/66  Pulse: 80  Weight: 145 lb 1.6 oz (65.8 kg)    Fetal Status: Fetal Heart Rate (bpm): 152 Fundal Height: 19 cm Movement: Present     General:  Alert, oriented and cooperative. Patient is in no acute distress.  Skin: Skin is warm and dry. No rash noted.   Cardiovascular: Normal heart rate noted  Respiratory: Normal respiratory effort, no problems with respiration noted  Abdomen: Soft, gravid, appropriate for gestational age.  Pain/Pressure: Present (pelvic pressure and pain)     Pelvic: Cervical exam deferred        Extremities: Normal range of motion.  Edema: None  Mental Status:  Normal mood and affect. Normal behavior. Normal judgment and thought content.   Assessment and Plan:  Pregnancy: [id number] at [redacted]w[redacted]d  1. [redacted] weeks gestation of pregnancy   2. History of prior pregnancy with IUGR newborn Current EFW 77%  3. HSV-2 (herpes simplex virus 2) infection Prophylaxis at 35-36 weeks  4. 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 Rh negative state in antepartum period   6. Supervision of high risk pregnancy, antepartum Continue routine prenatal care  7. Abdominal pain during pregnancy in second trimester Pt has generalized pelvic discomfort and would like physical therapy to address the issue - Ambulatory referral to Physical Therapy  Preterm labor symptoms and general obstetric precautions including but not limited to vaginal bleeding, contractions, leaking of fluid and fetal movement were reviewed in detail with the patient.  Please refer to After Visit Summary for other counseling recommendations.   Return in about 4 weeks (around 08/02/2023) for [facility], in person.   Mariel Aloe, MD Faculty Attending [facility]

## 2023-07-06 ENCOUNTER — Telehealth: Payer: Self-pay

## 2023-07-06 ENCOUNTER — Telehealth: Payer: Self-pay | Admitting: Lactation Services

## 2023-07-06 LAB — PANORAMA PRENATAL TEST FULL PANEL:PANORAMA TEST PLUS 5 ADDITIONAL MICRODELETIONS: FETAL FRACTION: 8.3

## 2023-07-06 LAB — RH IG WORKUP (INCLUDES ABO/RH)
ABO/RH(D): B NEG
Antibody Screen: POSITIVE
Gestational Age(Wks): 19

## 2023-07-06 NOTE — Telephone Encounter (Signed)
I spoke with the patient to return the fetal RhD risk from Panorama NIPS. The results returned that fetal RhD is positive. I discussed that this is a screening test but does put this pregnancy at higher risk, so I emphasized the importance of coming to the MFM appointments. Patient had no questions.  Brandy Plumber, MS Genetic Counselor [facility] [phone]

## 2023-07-06 NOTE — Telephone Encounter (Signed)
Opened in error

## 2023-07-06 NOTE — Telephone Encounter (Addendum)
[name] from Lab called regarding Brandy [name] blood work. According to lab tech, there wasn't enough of a blood sample to test for titers. Lab requesting more blood sample.   Maureen Ralphs RN on 07/06/23 at [phone]

## 2023-07-06 NOTE — Telephone Encounter (Signed)
Forwarded encounter to provider Hazle Coca MD); per provider, patient should come in to redraw blood to test titers for rH antibodies.    Called patient to schedule lab appointment for blood redraw. Patient was willing to come in for lab draw, but stated that a person from MFM Sheppard Plumber, genetics counselor) had called her 20 minutes prior to RN calling her, stating that baby's blood is Rh positive. Upon chart review, RN can confirm that patient was called with those results. Informed patient that I would consult with provider to see if she still needs to come in for another blood draw then; patient verbalized understanding and had no other questions or concerns. Forwarding message/chart back to provider.   Tangela Dolliver RN on 07/06/23 at 1457.

## 2023-07-06 NOTE — Telephone Encounter (Signed)
Per provider Hazle Coca MD), upon review, patient does not need to come in for blood redraw--stating that likely periodic checks on baby for alloimmunization should be done.   Called patient at number listed in chart--verified with full name and DOB. Informed patient of provider update and no longer need for her to come in for a blood draw. Patient verbalized understanding.   Patient then spoke of concerns she's been having regarding chest pain. Patient states that she's been having a sharp pain in chest (mainly underneath her breast and in her stomach), more so on her left side. Patient states that pain began this morning, and is intermittent. Per patient, she does not get SOB but it does cause difficulty communicating.   Strongly advised and encourage patient to head to MAU to evaluated. Patient verbalized understanding and had no other questions or concerns.   Maureen Ralphs RN on 07/06/23 at [phone]

## 2023-07-07 ENCOUNTER — Inpatient Hospital Stay (HOSPITAL_COMMUNITY)
Admission: AD | Admit: 2023-07-07 | Discharge: 2023-07-07 | Disposition: A | Discharge status: Home / Self Care | Payer: Medicaid Other | Attending: Obstetrics and Gynecology | Admitting: Obstetrics and Gynecology

## 2023-07-07 ENCOUNTER — Encounter (HOSPITAL_COMMUNITY): Payer: Self-pay | Admitting: Obstetrics and Gynecology

## 2023-07-07 DIAGNOSIS — O0990 Supervision of high risk pregnancy, unspecified, unspecified trimester: Secondary | ICD-10-CM

## 2023-07-07 DIAGNOSIS — R109 Unspecified abdominal pain: Secondary | ICD-10-CM

## 2023-07-07 DIAGNOSIS — O0992 Supervision of high risk pregnancy, unspecified, second trimester: Secondary | ICD-10-CM | POA: Insufficient documentation

## 2023-07-07 DIAGNOSIS — O26892 Other specified pregnancy related conditions, second trimester: Secondary | ICD-10-CM | POA: Diagnosis not present

## 2023-07-07 DIAGNOSIS — R102 Pelvic and perineal pain: Secondary | ICD-10-CM | POA: Diagnosis not present

## 2023-07-07 DIAGNOSIS — Z3A19 19 weeks gestation of pregnancy: Secondary | ICD-10-CM | POA: Diagnosis not present

## 2023-07-07 LAB — URINALYSIS, ROUTINE W REFLEX MICROSCOPIC
Bilirubin Urine: NEGATIVE
Glucose, UA: NEGATIVE mg/dL
Hgb urine dipstick: NEGATIVE
Ketones, ur: NEGATIVE mg/dL
Leukocytes,Ua: NEGATIVE
Nitrite: NEGATIVE
Protein, ur: NEGATIVE mg/dL
Specific Gravity, Urine: 1.014 (ref 1.005–1.030)
pH: 6 (ref 5.0–8.0)

## 2023-07-07 LAB — WET PREP, GENITAL
Clue Cells Wet Prep HPF POC: NONE SEEN
Sperm: NONE SEEN
Trich, Wet Prep: NONE SEEN
WBC, Wet Prep HPF POC: <10 (ref <10)
Yeast Wet Prep HPF POC: NONE SEEN

## 2023-07-07 NOTE — MAU Provider Note (Signed)
History     CSN: [id number]  Arrival date and time: 07/07/23 1614   None     Chief Complaint  Patient presents with   Pelvic Pain   Branden Fleites , a  27 y.o. [id number] at [redacted]w[redacted]d presents to MAU with complaints of pelvic pressure and abdominal tightening for the last 2 days. Patient states that she has tried Tylenol and Flexeril without relief. Last dose of tylenol was at 4pm this evening. She denies abnormal vaginal discharge or vaginal bleeding. She endorses positive fetal movement and and reports that her stomach only gets really tight about 1x per hour. She is not wearing a maternity support belt but has an appt with PT on Friday.        Pelvic Pain The patient's primary symptoms include pelvic pain. The patient's pertinent negatives include no vaginal discharge. Associated symptoms include abdominal pain. Pertinent negatives include no back pain, chills, constipation, diarrhea, dysuria, fever, headaches, nausea or vomiting.    OB History     Gravida  4   Para  3   Term  0   Preterm  3   AB      Living  3      SAB      IAB      Ectopic      Multiple  0   Live Births  3           Past Medical History:  Diagnosis Date   Chlamydia    Heart murmur    Diagnosed as a child   Migraine     Past Surgical History:  Procedure Laterality Date   NO PAST SURGERIES      Family History  Problem Relation Age of Onset   Asthma Mother     Social History   Tobacco Use   Smoking status: Never   Smokeless tobacco: Never  Vaping Use   Vaping status: Never Used  Substance Use Topics   Alcohol use: Not Currently   Drug use: No    Allergies:  Allergies  Allergen Reactions   Kiwi Extract Swelling   Metronidazole And Related Itching   Penicillins Hives and Other (See Comments)    Has patient had a PCN reaction causing immediate rash, facial/tongue/throat swelling, SOB or lightheadedness with hypotension: Yes Has patient had a PCN reaction causing  severe rash involving mucus membranes or skin necrosis: No Has patient had a PCN reaction that required hospitalization No Has patient had a PCN reaction occurring within the last 10 years: No If all of the above answers are "NO", then may proceed with Cephalosporin use.    Lobster [Shellfish Allergy] Rash   Reglan [Metoclopramide] Diarrhea    Medications Prior to Admission  Medication Sig Dispense Refill Last Dose   acetaminophen (TYLENOL) 500 MG tablet Take 500 mg by mouth every 6 (six) hours as needed.   07/07/2023 at 0900   cyclobenzaprine (FLEXERIL) 5 MG tablet Take 1 tablet (5 mg total) by mouth 3 (three) times daily as needed for muscle spasms. 30 tablet 0 07/06/2023   Blood Pressure Monitoring DEVI 1 each by Does not apply route once a week. (Patient not taking: Reported on 06/03/2023) 1 each 0    famotidine (PEPCID) 20 MG tablet Take 1 tablet (20 mg total) by mouth at bedtime. 30 tablet 0    ondansetron (ZOFRAN-ODT) 4 MG disintegrating tablet Take 1 tablet (4 mg total) by mouth every 8 (eight) hours as needed for nausea or vomiting. 15  tablet 1    polyethylene glycol (MIRALAX) 17 g packet Take 17 g by mouth daily. 14 each 0    Prenatal MV & Min w/FA-DHA (PRENATAL GUMMIES) 0.18-25 MG CHEW Chew by mouth.      scopolamine (TRANSDERM-SCOP) 1 MG/3DAYS Place 1 patch (1.5 mg total) onto the skin every 3 (three) days. 10 patch 4     Review of Systems  Constitutional:  Negative for chills, fatigue and fever.  Eyes:  Negative for pain and visual disturbance.  Respiratory:  Negative for apnea, shortness of breath and wheezing.   Cardiovascular:  Negative for chest pain and palpitations.  Gastrointestinal:  Positive for abdominal pain. Negative for constipation, diarrhea, nausea and vomiting.  Genitourinary:  Positive for pelvic pain. Negative for difficulty urinating, dysuria, vaginal bleeding, vaginal discharge and vaginal pain.  Musculoskeletal:  Negative for back pain.  Neurological:   Negative for seizures, weakness and headaches.  Psychiatric/Behavioral:  Negative for suicidal ideas.    Physical Exam   Blood pressure 110/70, pulse (!) 118, temperature 98.2 F (36.8 C), height 5\' 4"  (1.626 m), weight 67.6 kg, last menstrual period 02/20/2023.  Physical Exam Vitals and nursing note reviewed. Exam conducted with a chaperone present.  Constitutional:      General: She is not in acute distress.    Appearance: Normal appearance.  HENT:     Head: Normocephalic.  Pulmonary:     Effort: Pulmonary effort is normal.  Musculoskeletal:     Cervical back: Normal range of motion.  Skin:    General: Skin is warm and dry.  Neurological:     Mental Status: She is alert and oriented to person, place, and time.  Psychiatric:        Mood and Affect: Mood normal.    FHT obtained in triage  Dilation: Closed Exam by:: Dorathy Daft, CNM  MAU Course  Procedures Orders Placed This Encounter  Procedures   Wet prep, genital   Urinalysis, Routine w reflex microscopic -Urine, Clean Catch    MDM - Offered patient a muscle relaxer but declined.  - On cervical exam poop noted in her rectum. Encouraged a stool softener accompanied by miralax  - Low suspicion for Preterm labor based on cervical exam.  - Wet prep normal  - Plan for discharge.   Assessment and Plan   1. Supervision of high risk pregnancy, antepartum   2. Pelvic pain   3. [redacted] weeks gestation of pregnancy   4. Abdominal pain, unspecified abdominal location    - Reviewed that pelvic pain and braxton hicks can be a normal discomfort of pregnancy.  - Strongly recommended to continue with Tylenol and Flexeril regimen and PT  - First PT appt this Friday. Encouraged to keep appt.  - Reviewed worsening signs and return precautions.  - Patient discharged home in stable condition and may return to MAU as needed.   Claudette Head, MSN CNM  07/07/2023, 6:06 PM

## 2023-07-07 NOTE — Discharge Instructions (Signed)

## 2023-07-07 NOTE — MAU Note (Signed)
.  Brandy [name] is a 27 y.o. at [redacted]w[redacted]d here in MAU reporting: feels like her stomach is getting hard and pressing down into her pelvis. Pt has symphysis pain but stated this gets worse with the cramping she is having. Denies any vag bleeding or leaking.  LMP:  Onset of complaint: today Pain score: 8-10 Vitals:   07/07/23 1704  BP: 110/70  Pulse: (!) 118  Temp: 98.2 F (36.8 C)     FHT:141 Lab orders placed from triage:  u/a

## 2023-07-09 ENCOUNTER — Ambulatory Visit
Payer: Medicaid Other | Attending: Obstetrics and Gynecology | Admitting: Rehabilitative and Restorative Service Providers"

## 2023-07-09 ENCOUNTER — Other Ambulatory Visit: Payer: Self-pay

## 2023-07-09 ENCOUNTER — Encounter: Payer: Self-pay | Admitting: Rehabilitative and Restorative Service Providers"

## 2023-07-09 DIAGNOSIS — R252 Cramp and spasm: Secondary | ICD-10-CM

## 2023-07-09 DIAGNOSIS — O26892 Other specified pregnancy related conditions, second trimester: Secondary | ICD-10-CM | POA: Diagnosis present

## 2023-07-09 DIAGNOSIS — M7918 Myalgia, other site: Secondary | ICD-10-CM | POA: Insufficient documentation

## 2023-07-09 DIAGNOSIS — O26899 Other specified pregnancy related conditions, unspecified trimester: Secondary | ICD-10-CM

## 2023-07-09 DIAGNOSIS — R109 Unspecified abdominal pain: Secondary | ICD-10-CM | POA: Diagnosis not present

## 2023-07-09 DIAGNOSIS — R102 Other specified pregnancy related conditions, unspecified trimester: Secondary | ICD-10-CM

## 2023-07-09 DIAGNOSIS — R262 Difficulty in walking, not elsewhere classified: Secondary | ICD-10-CM

## 2023-07-09 DIAGNOSIS — M6281 Muscle weakness (generalized): Secondary | ICD-10-CM

## 2023-07-09 LAB — GC/CHLAMYDIA PROBE AMP (~~LOC~~) NOT AT ARMC
Chlamydia: NEGATIVE
Comment: NEGATIVE
Comment: NORMAL
Neisseria Gonorrhea: NEGATIVE

## 2023-07-09 NOTE — Therapy (Signed)
OUTPATIENT PHYSICAL THERAPY EVALUATION   Patient Name: Brandy [name] MRN: [id number] DOB:[birth year], 29 y.o., female Today's Date: 07/09/2023  END OF SESSION:  PT End of Session - 07/09/23 0809     Visit Number 1    Date for PT Re-Evaluation 10/01/23    Authorization Type UHC Medicaid    PT Start Time 0804    PT Stop Time 0840    PT Time Calculation (min) 36 min    Activity Tolerance Patient tolerated treatment well    Behavior During Therapy WFL for tasks assessed/performed             Past Medical History:  Diagnosis Date   Chlamydia    Heart murmur    Diagnosed as a child   Migraine    Past Surgical History:  Procedure Laterality Date   NO PAST SURGERIES     Patient Active Problem List   Diagnosis Date Noted   Supervision of high risk pregnancy, antepartum 04/28/2023   Rh negative state in antepartum period 04/25/2015   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) 04/25/2015   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    PCP: Warden Fillers, MD  REFERRING PROVIDER: Warden Fillers, MD  REFERRING DIAG: [phone] (ICD-10-CM) - Abdominal pain during pregnancy in second trimester  Rationale for Evaluation and Treatment: Rehabilitation  THERAPY DIAG:  Pelvic pain affecting pregnancy, antepartum - Plan: PT plan of care cert/re-cert  Muscle weakness (generalized) - Plan: PT plan of care cert/re-cert  Cramp and spasm - Plan: PT plan of care cert/re-cert  Difficulty in walking, not elsewhere classified - Plan: PT plan of care cert/re-cert  ONSET DATE: Pt states past 2-3 months  SUBJECTIVE:                                                                                                                                                                                           SUBJECTIVE STATEMENT: Pt states that approximately 2-3 months ago, she started having abdominal and pelvic pain during pregnancy.  Pt reports that pain  has gotten worse as pregnancy has been progressing.  Pt denies having this pain during previous pregnancies.  PERTINENT HISTORY:  Pt pregnant with estimated due of November 27, 2023.  Hx of 3 vaginal deliveries.  Sexual Intercourse: Pt reports that she has been unable to have sexual intercourse secondary to increased pain.  Unable for any form of penetration secondary to pain.  PAIN:  Are you having pain? Yes: NPRS scale: 9-10/10 Pain location: abdominal and pelvic Pain description: aching, but sharp with movement Aggravating factors: movement Relieving factors: medication calms it down  some  PRECAUTIONS: None  RED FLAGS: None   WEIGHT BEARING RESTRICTIONS: No  FALLS:  Has patient fallen in last 6 months? Yes. Number of falls Pt reports a fall 07/03/23 when going down the stairs and tripped on shoes on the stairs that her children had left  LIVING ENVIRONMENT: Lives with: lives with their family Lives in: House/apartment Stairs:  two level home Has following equipment at home: None  OCCUPATION: Out of work  PLOF: Independent and Leisure: watching tv, shopping, playing with children  PATIENT GOALS: To ease up the pain.  NEXT MD VISIT: Routine OB appointments  OBJECTIVE:  Note: Objective measures were completed at Evaluation unless otherwise noted.  DIAGNOSTIC FINDINGS:  Routine ultrasounds for pregnancy  PATIENT SURVEYS:  LEFS 4 / 80 = 5.0 %   COGNITION: Overall cognitive status: Within functional limits for tasks assessed     SENSATION: Pt denies any numbness or tingling  MUSCLE LENGTH: Hamstrings: minimal tightness noted  POSTURE: rounded shoulders, forward head, and anterior pelvic tilt  LUMBAR ROM:   Limited secondary to pain  LOWER EXTREMITY ROM:     WFL  LOWER EXTREMITY MMT:    Eval: Right LE strength grossly 4 to 4+/5 throughout Left LE strength is WFL  LUMBAR SPECIAL TESTS:  Slump test: Negative  FUNCTIONAL TESTS:  Eval: 5 times sit  to stand: 40.88 sec with hands pressing thighs Forward T:  Instability of hips noted with increased pain.  GAIT: Distance walked: >200 ft Assistive device utilized: None Level of assistance: Complete Independence Comments: Pt with pain noted almost immediately  TODAY'S TREATMENT:                                                                                                                              DATE: 07/09/2023 Issued HEP and provided handout, discussed pelvic PT    PATIENT EDUCATION:  Education details: Issued HEP Person educated: Patient Education method: Explanation, Demonstration, and Handouts Education comprehension: verbalized understanding  HOME EXERCISE PROGRAM: Access Code: [email or web address] URL: [email or web address] Date: 07/09/2023 Prepared by: Clydie Braun Ardelle Haliburton  Exercises - Seated Hamstring Stretch  - 1 x daily - 7 x weekly - 1 sets - 2-5 reps - 45s hold - Supine Diaphragmatic Breathing  - 1 x daily - 7 x weekly - 1 sets - 10 reps - Supine Hip Adductor Stretch  - 1 x daily - 7 x weekly - 1 sets - 2-5 reps - 45 hold - Supine Pelvic Floor Stretch  - 1 x daily - 7 x weekly - 1 sets - 2-5 reps - 45 hold - Hooklying Transversus Abdominis Palpation  - 1 x daily - 7 x weekly - 1 sets - 10 reps - 2-3s holds - Modified Thomas Stretch  - 1 x daily - 7 x weekly - 1 sets - 2-5 reps - 45s hold - Diaphragmatic Breathing in Child's Pose with Pelvic Floor Relaxation  - 1 x daily - 7 x  weekly - 1 sets - 2-5 reps - 45s hold  ASSESSMENT:  CLINICAL IMPRESSION: Patient is a 27 y.o. female who was seen today for physical therapy evaluation and treatment for abdominal pain during pregnancy.  Patient states that she has been having most pain in her pelvic region.  Patient reports that she has been unable to have sexual intercourse as a result of the pain and she has difficulty performing many tasks around her home secondary to the pain.  Patient denies any urinary incontinence.   Patient reports 3 previous pregnancies were unremarkable.  Patient with muscle weakness, muscle spasms, abnormal posture, difficulty walking, and increased abdominal and pelvic pain.  Patient would benefit from skilled PT to address her functional impairments to allow her to perform desired tasks.  OBJECTIVE IMPAIRMENTS: decreased activity tolerance, decreased balance, decreased coordination, difficulty walking, decreased strength, increased muscle spasms, postural dysfunction, and pain.   ACTIVITY LIMITATIONS: carrying, lifting, bending, standing, squatting, stairs, and bed mobility  PARTICIPATION LIMITATIONS: cleaning, shopping, and community activity  PERSONAL FACTORS: Time since onset of injury/illness/exacerbation and 1 comorbidity: Pregnant with estimated due date of 11/27/2023  are also affecting patient's functional outcome.   REHAB POTENTIAL: Good  CLINICAL DECISION MAKING: Stable/uncomplicated  EVALUATION COMPLEXITY: Low   GOALS: Goals reviewed with patient? Yes  SHORT TERM GOALS: Target date: 07/09/2023  Patient will be independent with initial HEP. Baseline: Goal status: INITIAL  2.  Patient will demonstrate good body mechanics during bed mobility to decrease pain during task. Baseline:  Goal status: INITIAL   LONG TERM GOALS: Target date: 10/01/2023  Patient will be independent with advanced HEP to allow for self-progression post discharge. Baseline:  Goal status: INITIAL  2.  Patient will increase right LE functional strength to [facility] to allow her to perform a forward T without increased hip instability. Baseline:  Goal status: INITIAL  3.  Patient will report ability to navigate stairs at home without increased pain. Baseline:  Goal status: INITIAL  4.  Patient will improve 5 times sit to/from stand to no greater than 20 seconds without UE support to demonstrate improved functional strength. Baseline:  Goal status: INITIAL  5.  Patient will be able to  demonstrate proper body mechanics and posture during simulated tasks in PT clinic to promote decreased pain. Baseline:  Goal status: INITIAL    PLAN:  PT FREQUENCY: 1-2x/week  PT DURATION: 12 weeks  PLANNED INTERVENTIONS: 97164- PT Re-evaluation, 97110-Therapeutic exercises, 97530- Therapeutic activity, 97112- Neuromuscular re-education, 97535- Self Care, 40102- Manual therapy, [phone]- Gait training, [phone]- Canalith repositioning, [id number]- Aquatic Therapy, [phone]- Ultrasound, Patient/Family education, Balance training, Stair training, Taping, Dry Needling, Joint mobilization, Joint manipulation, Spinal manipulation, Spinal mobilization, Cryotherapy, Moist heat, and pelvic examination .  PLAN FOR NEXT SESSION: Assess/progress HEP as indicated, pelvic examination/internal exam as indicated, core stability, strengthening, assess if maternal belt would be beneficial.   Reather Laurence, PT, DPT 07/09/23, 9:59 AM  [facility] [address] [place], [place] [address] Phone # [phone] Fax [phone]

## 2023-07-12 ENCOUNTER — Ambulatory Visit: Payer: Medicaid Other

## 2023-07-12 DIAGNOSIS — Z8759 Personal history of other complications of pregnancy, childbirth and the puerperium: Secondary | ICD-10-CM | POA: Insufficient documentation

## 2023-07-12 DIAGNOSIS — O365990 Maternal care for other known or suspected poor fetal growth, unspecified trimester, not applicable or unspecified: Secondary | ICD-10-CM | POA: Insufficient documentation

## 2023-07-12 DIAGNOSIS — O09899 Supervision of other high risk pregnancies, unspecified trimester: Secondary | ICD-10-CM | POA: Insufficient documentation

## 2023-07-19 ENCOUNTER — Other Ambulatory Visit: Payer: Self-pay

## 2023-07-19 ENCOUNTER — Ambulatory Visit: Payer: Medicaid Other | Attending: Maternal & Fetal Medicine

## 2023-07-19 ENCOUNTER — Other Ambulatory Visit: Payer: Self-pay | Admitting: *Deleted

## 2023-07-19 VITALS — BP 116/70

## 2023-07-19 DIAGNOSIS — Z3182 Encounter for Rh incompatibility status: Secondary | ICD-10-CM | POA: Diagnosis present

## 2023-07-19 DIAGNOSIS — O3503X0 Maternal care for (suspected) central nervous system malformation or damage in fetus, choroid plexus cysts, not applicable or unspecified: Secondary | ICD-10-CM

## 2023-07-19 DIAGNOSIS — Z8759 Personal history of other complications of pregnancy, childbirth and the puerperium: Secondary | ICD-10-CM | POA: Diagnosis present

## 2023-07-19 DIAGNOSIS — O360120 Maternal care for anti-D [Rh] antibodies, second trimester, not applicable or unspecified: Secondary | ICD-10-CM

## 2023-07-19 DIAGNOSIS — O09212 Supervision of pregnancy with history of pre-term labor, second trimester: Secondary | ICD-10-CM | POA: Diagnosis not present

## 2023-07-19 DIAGNOSIS — O0990 Supervision of high risk pregnancy, unspecified, unspecified trimester: Secondary | ICD-10-CM | POA: Diagnosis present

## 2023-07-19 DIAGNOSIS — O09292 Supervision of pregnancy with other poor reproductive or obstetric history, second trimester: Secondary | ICD-10-CM | POA: Diagnosis not present

## 2023-07-19 DIAGNOSIS — O09899 Supervision of other high risk pregnancies, unspecified trimester: Secondary | ICD-10-CM | POA: Insufficient documentation

## 2023-07-19 DIAGNOSIS — Z3A21 21 weeks gestation of pregnancy: Secondary | ICD-10-CM

## 2023-07-23 ENCOUNTER — Other Ambulatory Visit: Payer: Self-pay

## 2023-07-23 ENCOUNTER — Ambulatory Visit: Payer: Medicaid Other | Admitting: Physical Therapy

## 2023-07-23 ENCOUNTER — Encounter: Payer: Self-pay | Admitting: Physical Therapy

## 2023-07-23 ENCOUNTER — Telehealth: Payer: Self-pay | Admitting: Lactation Services

## 2023-07-23 DIAGNOSIS — M6281 Muscle weakness (generalized): Secondary | ICD-10-CM

## 2023-07-23 DIAGNOSIS — R262 Difficulty in walking, not elsewhere classified: Secondary | ICD-10-CM

## 2023-07-23 DIAGNOSIS — O26892 Other specified pregnancy related conditions, second trimester: Secondary | ICD-10-CM | POA: Diagnosis not present

## 2023-07-23 DIAGNOSIS — R252 Cramp and spasm: Secondary | ICD-10-CM

## 2023-07-23 DIAGNOSIS — O26899 Other specified pregnancy related conditions, unspecified trimester: Secondary | ICD-10-CM

## 2023-07-23 NOTE — Telephone Encounter (Signed)
Received PA request for Scopolamine patches. Called Pharmacy and they reports they are only able to get a specific NDC number and is on back order and not able to obtain at this time.  Will have patient contact another pharmacy in the area to see if they have ones available. Will send Mychart message.

## 2023-07-23 NOTE — Therapy (Addendum)
OUTPATIENT PHYSICAL THERAPY FEMALE PELVIC EVALUATION   Patient Name: Brandy [name] MRN: [id number] DOB:1996/06/25, 27 y.o., female Today's Date: 07/23/2023  END OF SESSION:  PT End of Session - 07/23/23 1152     Visit Number 2    Date for PT Re-Evaluation 10/01/23    Authorization Type UHC Medicaid    PT Start Time 1100    PT Stop Time 1150    PT Time Calculation (min) 50 min    Activity Tolerance Patient tolerated treatment well    Behavior During Therapy WFL for tasks assessed/performed             Past Medical History:  Diagnosis Date   Chlamydia    Heart murmur    Diagnosed as a child   Migraine    Past Surgical History:  Procedure Laterality Date   NO PAST SURGERIES     Patient Active Problem List   Diagnosis Date Noted   History of preterm delivery, currently pregnant 07/12/2023   Previous baby with fetal growth restriction 07/12/2023   Supervision of high risk pregnancy, antepartum 04/28/2023   Rh negative state in antepartum period 04/25/2015   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) 04/25/2015   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    [facility]   REFERRING PROVIDER: Warden Fillers, MD Ref Provider  REFERRING DIAG: [id number] (ICD-10-CM) - Abdominal pain during pregnancy in second trimester   THERAPY DIAG:  Pelvic pain affecting pregnancy, antepartum  Difficulty in walking, not elsewhere classified  Muscle weakness (generalized)  Cramp and spasm  Rationale for Evaluation and Treatment: Rehabilitation  ONSET DATE: 11/202024  SUBJECTIVE:                                                                                                                                                                                           SUBJECTIVE STATEMENT: Pt  is [redacted] weeks pregnant, 4th pregnancy. Comes to PT with low back pain. Low back pain started a couple months ago.  Sharp pain. Saw ortho 07/09/2023 Lying on back, sitting to a long time.  Exercises from [facility] ( ortho PT) did not seem to help, pt has been doing them anyway Pt reports that she fell about a month ago and had to go to the ER, since then she has had pain in pelvis and her low back. Was taking muscle relaxants - they gave her some at the ER, stopped. Has been doing hot showers.  Has not felt good either during the first trimester, has had high sugar. Pt is high risk pregnancy dt rh factor, Baby  is a girl Fluid intake: Yes: states that she is always thirsty    PAIN:  Are you having pain? Yes NPRS scale: 7-8/10 Pain location:  low back, sacrum, sharp pain into her butt  Pain type: sharp Pain description: sharp   Aggravating factors: standing, sitting Relieving factors: reclined sitting, sometimes moving  PRECAUTIONS: None  RED FLAGS: None   WEIGHT BEARING RESTRICTIONS: No  FALLS:  Has patient fallen in last 6 months? Yes. Number of falls 1  LIVING ENVIRONMENT: Lives with: lives with their family Lives in: House/apartment Stairs: Yes: Internal: 5 steps; not asked Has following equipment at home: None  OCCUPATION: to be asked  PLOF: Independent  PATIENT GOALS: to have less pain  PERTINENT HISTORY:  3 vaginal deliveries Sexual abuse: to be asked  BOWEL MOVEMENT:  Pain with bowel movement: No Type of bowel movement:Type (Bristol Stool Scale) 1 Fully empty rectum: Yes:   Leakage: No Pads: No Fiber supplement: No  URINATION: no issues  INTERCOURSE: NA- d/t pain - does not want to try    PREGNANCY: Vaginal deliveries 3 Tearing Yes:   C-section deliveries 0 Currently pregnant Yes: 20  PROLAPSE: None   OBJECTIVE:  Note: Objective measures were completed at Evaluation unless otherwise noted.   PATIENT SURVEYS:   LEFS- 5%  COGNITION: Overall cognitive status: Within functional limits for tasks assessed     SENSATION: Light touch: Appears  intact Proprioception: Appears intact  MUSCLE LENGTH: Hamstrings: Right 70 deg; Left 70 deg   LUMBAR SPECIAL TESTS:  Single leg stance test: Positive- unable to d/t weakness, has to hold on, hips dip     POSTURE: No Significant postural limitations, rounded shoulders, forward head, increased lumbar lordosis, and anterior pelvic tilt  PELVIC ALIGNMENT:seems even  LUMBARAROM/PROM:limited d/t pain and pregnancy, guarded  LOWER EXTREMITY ROM: guarding throughout hips  LOWER EXTREMITY MMT: 4/5 bilateral grossly throughout   PALPATION:   General  low abdominal tone, pelvic compression and trial of Serola belt relieves symptoms Lumbar paraspinals gripping, more on right                External Perineal Exam to be assessed if needed                             Internal Pelvic Floor to be assessed if needed  Patient confirms identification and approves PT to assess internal pelvic floor and treatment No  PELVIC MMT:   MMT eval  Vaginal   Internal Anal Sphincter   External Anal Sphincter   Puborectalis   Diastasis Recti yes  (Blank rows = not tested)        TONE: To be assessed if needed  PROLAPSE: To be assessed if needed  TODAY'S TREATMENT:  DATE: 07/23/23   EVAL see below   PATIENT EDUCATION:  Education details: relevant anatomy, serola belt trial, HEP, heat/ ice, exercises Person educated: Patient Education method: Explanation and Demonstration Education comprehension: verbalized understanding and needs further education  HOME EXERCISE PROGRAM: [email or web address]  ASSESSMENT:  CLINICAL IMPRESSION: Patient is a 27 y.o. F who was seen today for physical therapy evaluation and treatment for low back pain and pubic pain post fall. She is [redacted] weeks pregnant. Did well with trial of serola belt with a lot of pain relief, tried exercises, however pt  was pretty irritable and sleep deprived and had difficulty with coordination. She is weak throughout core and hips, has been nauseated and not eating, possibly high blood sugar, not feeling well today. Pt will benefit from PT to reduce pain and improve tolerance to walking, intercourse and improve mobility and strength.   OBJECTIVE IMPAIRMENTS: decreased activity tolerance, decreased coordination, decreased endurance, difficulty walking, decreased ROM, decreased strength, dizziness, increased muscle spasms, and pain.   ACTIVITY LIMITATIONS: lifting, bending, sitting, standing, squatting, sleeping, locomotion level, and caring for others  PARTICIPATION LIMITATIONS: meal prep, cleaning, laundry, and interpersonal relationship  PERSONAL FACTORS: Social background and Time since onset of injury/illness/exacerbation are also affecting patient's functional outcome.   REHAB POTENTIAL: Good  CLINICAL DECISION MAKING: Stable/uncomplicated  EVALUATION COMPLEXITY: Low   GOALS: Goals reviewed with patient? Yes  SHORT TERM GOALS: Target date: 08/20/2023    Pt will be I with her initial HEP Baseline: Goal status: INITIAL  2.  Pt will report reduced pelvic and back pain to 4/10 at most with standing for 30 mins Baseline:  Goal status: INITIAL  3.  Pt will be able to walk for 30 mins without increased pain Baseline:  Goal status: INITIAL   LONG TERM GOALS: Target date: 10/15/2023    Pt will have reduced pain with walking for an hour to max 2/10 Baseline:  Goal status: INITIAL  2.  Pt will be able to sleep without increased pain at least 6 hrs Baseline:  Goal status: INITIAL  3.  Pt will be I with advanced HEP Baseline:  Goal status: INITIAL  4.  Pt will will able to bend, stoop and lift without increased pain Baseline:  Goal status: INITIAL  5.  Pt will report no pain with intercourse Baseline:  Goal status: INITIAL  PLAN:  PT FREQUENCY: 1-2x/week  PT DURATION: 12  weeks  PLANNED INTERVENTIONS: 97110-Therapeutic exercises, 97530- Therapeutic activity, 97112- Neuromuscular re-education, 97535- Self Care, 30865- Manual therapy, [phone]- Electrical stimulation (manual), Taping, Dry Needling, Joint mobilization, Joint manipulation, Spinal manipulation, Spinal mobilization, Moist heat, and Biofeedback  PLAN FOR NEXT SESSION: trial of dry needling and pelvic alignment  Robbert Langlinais, PT 07/23/23 12:24 PM

## 2023-07-30 ENCOUNTER — Encounter (HOSPITAL_COMMUNITY): Payer: Self-pay | Admitting: Obstetrics & Gynecology

## 2023-07-30 ENCOUNTER — Other Ambulatory Visit: Payer: Self-pay

## 2023-07-30 ENCOUNTER — Inpatient Hospital Stay (HOSPITAL_COMMUNITY)
Admission: AD | Admit: 2023-07-30 | Discharge: 2023-07-30 | Disposition: A | Discharge status: Home / Self Care | Payer: Medicaid Other | Attending: Obstetrics & Gynecology | Admitting: Obstetrics & Gynecology

## 2023-07-30 DIAGNOSIS — R200 Anesthesia of skin: Secondary | ICD-10-CM | POA: Diagnosis not present

## 2023-07-30 DIAGNOSIS — O99352 Diseases of the nervous system complicating pregnancy, second trimester: Secondary | ICD-10-CM | POA: Diagnosis not present

## 2023-07-30 DIAGNOSIS — Z3A22 22 weeks gestation of pregnancy: Secondary | ICD-10-CM | POA: Insufficient documentation

## 2023-07-30 DIAGNOSIS — O26892 Other specified pregnancy related conditions, second trimester: Secondary | ICD-10-CM | POA: Insufficient documentation

## 2023-07-30 DIAGNOSIS — R531 Weakness: Secondary | ICD-10-CM | POA: Diagnosis not present

## 2023-07-30 DIAGNOSIS — G43509 Persistent migraine aura without cerebral infarction, not intractable, without status migrainosus: Secondary | ICD-10-CM

## 2023-07-30 DIAGNOSIS — R110 Nausea: Secondary | ICD-10-CM | POA: Diagnosis not present

## 2023-07-30 DIAGNOSIS — G43E19 Chronic migraine with aura, intractable, without status migrainosus: Secondary | ICD-10-CM | POA: Diagnosis not present

## 2023-07-30 LAB — COMPREHENSIVE METABOLIC PANEL
ALT: 14 U/L (ref 0–44)
AST: 21 U/L (ref 15–41)
Albumin: 2.6 g/dL — ABNORMAL LOW (ref 3.5–5.0)
Alkaline Phosphatase: 57 U/L (ref 38–126)
Anion gap: 8 (ref 5–15)
BUN: 9 mg/dL (ref 6–20)
CO2: 21 mmol/L — ABNORMAL LOW (ref 22–32)
Calcium: 8.5 mg/dL — ABNORMAL LOW (ref 8.9–10.3)
Chloride: 106 mmol/L (ref 98–111)
Creatinine, Ser: 0.74 mg/dL (ref 0.44–1.00)
GFR, Estimated: >60 mL/min (ref >60)
Glucose, Bld: 90 mg/dL (ref 70–99)
Potassium: 3.6 mmol/L (ref 3.5–5.1)
Sodium: 135 mmol/L (ref 135–145)
Total Bilirubin: 0.3 mg/dL (ref <1.2)
Total Protein: 5.7 g/dL — ABNORMAL LOW (ref 6.5–8.1)

## 2023-07-30 LAB — CBC
HCT: 33.9 % — ABNORMAL LOW (ref 36.0–46.0)
Hemoglobin: 11.1 g/dL — ABNORMAL LOW (ref 12.0–15.0)
MCH: 33.3 pg (ref 26.0–34.0)
MCHC: 32.7 g/dL (ref 30.0–36.0)
MCV: 101.8 fL — ABNORMAL HIGH (ref 80.0–100.0)
Platelets: 254 10*3/uL (ref 150–400)
RBC: 3.33 MIL/uL — ABNORMAL LOW (ref 3.87–5.11)
RDW: 12.5 % (ref 11.5–15.5)
WBC: 4.4 10*3/uL (ref 4.0–10.5)
nRBC: 0 % (ref 0.0–0.2)

## 2023-07-30 LAB — URINALYSIS, ROUTINE W REFLEX MICROSCOPIC
Bilirubin Urine: NEGATIVE
Glucose, UA: NEGATIVE mg/dL
Hgb urine dipstick: NEGATIVE
Ketones, ur: NEGATIVE mg/dL
Nitrite: NEGATIVE
Protein, ur: NEGATIVE mg/dL
Specific Gravity, Urine: 1.02 (ref 1.005–1.030)
pH: 7.5 (ref 5.0–8.0)

## 2023-07-30 LAB — URINALYSIS, MICROSCOPIC (REFLEX): RBC / HPF: NONE SEEN RBC/hpf (ref 0–5)

## 2023-07-30 MED ORDER — ONDANSETRON 4 MG PO TBDP
4.0000 mg | ORAL_TABLET | Freq: Once | ORAL | Status: AC
  Administered 2023-07-30: 4 mg via ORAL
  Filled 2023-07-30: qty 1

## 2023-07-30 MED ORDER — LACTATED RINGERS IV BOLUS
1000.0000 mL | Freq: Once | INTRAVENOUS | Status: AC
  Administered 2023-07-30: 1000 mL via INTRAVENOUS

## 2023-07-30 MED ORDER — HYOSCYAMINE SULFATE 0.125 MG SL SUBL: 0.2500 mg | SUBLINGUAL_TABLET | Freq: Once | SUBLINGUAL | Status: DC

## 2023-07-30 MED ORDER — ACETAMINOPHEN 500 MG PO TABS
1000.0000 mg | ORAL_TABLET | Freq: Once | ORAL | Status: AC
  Administered 2023-07-30: 1000 mg via ORAL
  Filled 2023-07-30: qty 2

## 2023-07-30 MED ORDER — CAFFEINE 200 MG PO TABS
200.0000 mg | ORAL_TABLET | Freq: Once | ORAL | Status: AC
  Administered 2023-07-30: 200 mg via ORAL
  Filled 2023-07-30: qty 1

## 2023-07-30 MED ORDER — CYCLOBENZAPRINE HCL 5 MG PO TABS
5.0000 mg | ORAL_TABLET | Freq: Three times a day (TID) | ORAL | Status: DC | PRN
  Administered 2023-07-30: 5 mg via ORAL
  Filled 2023-07-30: qty 1

## 2023-07-30 NOTE — MAU Provider Note (Cosign Needed Addendum)
Chief Complaint:  Headache and Weakness, Numbness and tingfling in hands  HPI  HPI: Brandy [name] is a 27 y.o. [phone] at 9w6dwho presents to maternity admissions reporting headache, weakness, numbness and tingling of hands and feet x2 days. Intermittent nausea since yesterday, no emesis. Decreased UOP x2 days. Tolerating Gatorade and food. Similar to prepregnancy migraines but longer lasting, typically tylenol has worked in the past but not lately.   She reports good fetal movement, denies LOF, vaginal bleeding, vaginal itching/burning, urinary symptoms, h/a, dizziness, n/v, diarrhea, constipation or fever/chills.  She denies headache, visual changes or RUQ abdominal pain.   Past Medical History: Past Medical History:  Diagnosis Date   Chlamydia    Heart murmur    Diagnosed as a child   Migraine     Past obstetric history: OB History  Gravida Para Term Preterm AB Living  4 3 0 3  3  SAB IAB Ectopic Multiple Live Births     0 3    # Outcome Date GA Lbr Len/2nd Weight Sex Type Anes PTL Lv  4 Current           3 Preterm 11/22/16 [redacted]w[redacted]d 09:55 / 00:03 2515 g M Vag-Spont EPI  LIV  2 Preterm 07/04/15 [redacted]w[redacted]d 13:46 / 00:16 2760 g F Vag-Spont EPI  LIV  1 Preterm 12/27/13 [redacted]w[redacted]d  2608 g M Vag-Spont EPI N LIV     Birth Comments: System Generated. Please review and update pregnancy details.    Past Surgical History: Past Surgical History:  Procedure Laterality Date   NO PAST SURGERIES      Family History: Family History  Problem Relation Age of Onset   Asthma Mother     Social History: Social History   Tobacco Use   Smoking status: Never   Smokeless tobacco: Never  Vaping Use   Vaping status: Never Used  Substance Use Topics   Alcohol use: Not Currently   Drug use: No    Allergies:  Allergies  Allergen Reactions   Kiwi Extract Swelling   Metronidazole And Related Itching   Penicillins Hives and Other (See Comments)    Has patient had a PCN reaction causing  immediate rash, facial/tongue/throat swelling, SOB or lightheadedness with hypotension: Yes Has patient had a PCN reaction causing severe rash involving mucus membranes or skin necrosis: No Has patient had a PCN reaction that required hospitalization No Has patient had a PCN reaction occurring within the last 10 years: No If all of the above answers are "NO", then may proceed with Cephalosporin use.    Lobster [Shellfish Allergy] Rash   Reglan [Metoclopramide] Diarrhea    Meds:  No medications prior to admission.    I have reviewed patient's Past Medical Hx, Surgical Hx, Family Hx, Social Hx, medications and allergies.   ROS:  Review of Systems Other systems negative  Physical Exam  Patient Vitals for the past 24 hrs:  BP Temp Temp src Pulse Resp SpO2 Height Weight  07/30/23 1438 118/71 -- -- 67 -- -- -- --  07/30/23 1413 (!) 103/54 -- -- 66 -- -- -- --  07/30/23 1246 (!) 113/59 -- -- 74 -- -- -- --  07/30/23 1220 -- -- -- -- -- 100 % -- --  07/30/23 1213 112/64 99.3 F (37.4 C) Oral 74 16 -- -- --  07/30/23 1209 112/64 99.3 F (37.4 C) Oral 74 16 100 % 5\' 4"  (1.626 m) 70.1 kg   Constitutional: Well-developed, well-nourished female in no acute distress.  Cardiovascular: normal rate and rhythm Respiratory: normal effort, clear to auscultation bilaterally GI: Abd soft, non-tender, gravid appropriate for gestational age.   No rebound or guarding. MS: Extremities nontender, no edema, normal ROM Neurologic: Alert and oriented x 4.  GU: Neg CVAT.   FHT:  157   Labs: Results for orders placed or performed during the hospital encounter of 07/30/23 (from the past 24 hours)  CBC     Status: Abnormal   Collection Time: 07/30/23 12:35 PM  Result Value Ref Range   WBC 4.4 4.0 - 10.5 K/uL   RBC 3.33 (L) 3.87 - 5.11 MIL/uL   Hemoglobin 11.1 (L) 12.0 - 15.0 g/dL   HCT 08.6 (L) 57.8 - 46.9 %   MCV 101.8 (H) 80.0 - 100.0 fL   MCH 33.3 26.0 - 34.0 pg   MCHC 32.7 30.0 - 36.0 g/dL    RDW 62.9 52.8 - 41.3 %   Platelets 254 150 - 400 K/uL   nRBC 0.0 0.0 - 0.2 %  Comprehensive metabolic panel     Status: Abnormal   Collection Time: 07/30/23 12:35 PM  Result Value Ref Range   Sodium 135 135 - 145 mmol/L   Potassium 3.6 3.5 - 5.1 mmol/L   Chloride 106 98 - 111 mmol/L   CO2 21 (L) 22 - 32 mmol/L   Glucose, Bld 90 70 - 99 mg/dL   BUN 9 6 - 20 mg/dL   Creatinine, Ser 2.44 0.44 - 1.00 mg/dL   Calcium 8.5 (L) 8.9 - 10.3 mg/dL   Total Protein 5.7 (L) 6.5 - 8.1 g/dL   Albumin 2.6 (L) 3.5 - 5.0 g/dL   AST 21 15 - 41 U/L   ALT 14 0 - 44 U/L   Alkaline Phosphatase 57 38 - 126 U/L   Total Bilirubin 0.3 <1.2 mg/dL   GFR, Estimated >01 >02 mL/min   Anion gap 8 5 - 15  Urinalysis, Routine w reflex microscopic -Urine, Clean Catch     Status: Abnormal   Collection Time: 07/30/23 12:57 PM  Result Value Ref Range   Color, Urine YELLOW YELLOW   APPearance CLOUDY (A) CLEAR   Specific Gravity, Urine 1.020 1.005 - 1.030   pH 7.5 5.0 - 8.0   Glucose, UA NEGATIVE NEGATIVE mg/dL   Hgb urine dipstick NEGATIVE NEGATIVE   Bilirubin Urine NEGATIVE NEGATIVE   Ketones, ur NEGATIVE NEGATIVE mg/dL   Protein, ur NEGATIVE NEGATIVE mg/dL   Nitrite NEGATIVE NEGATIVE   Leukocytes,Ua TRACE (A) NEGATIVE  Urinalysis, Microscopic (reflex)     Status: Abnormal   Collection Time: 07/30/23 12:57 PM  Result Value Ref Range   RBC / HPF NONE SEEN 0 - 5 RBC/hpf   WBC, UA 0-5 0 - 5 WBC/hpf   Bacteria, UA FEW (A) NONE SEEN   Squamous Epithelial / HPF 11-20 0 - 5 /HPF   Mucus PRESENT    --/--/B NEG (11/30 1851)  Imaging:  [place] MFM MCA DOPPLER Result Date: 07/19/2023 ----------------------------------------------------------------------  OBSTETRICS REPORT                       (Signed Final 07/19/2023 08:33 am) ---------------------------------------------------------------------- Patient Info  ID #:       [id number]                          D.O.B.:  10/09/1995 (27 yrs)(F)  Name:       Brandy [name]  Visit Date: 07/19/2023 07:43 am ---------------------------------------------------------------------- Performed By  Attending:        Noralee Space MD        Referred By:      [facility] MAU/Triage  Performed By:     Dennis Bast RDMS      Location[facility]         [facility] ---------------------------------------------------------------------- Orders  #  Description                           Code        Ordered By  1  [place] MFM MCA DOPPLER                    76821.01    BURK SCHAIBLE  2  [place] MFM OB LIMITED                     76815.01    [facility] ----------------------------------------------------------------------  #  Order #                     Accession #                Episode #  1  [id number]                   [id number]                 [id number]  2  [id number]                   [id number]                 [id number] ---------------------------------------------------------------------- Indications  Maternal care for anti-D [Rh} antibodies,      O36.0120  second trimester, unspecified  Poor obstetric history: Previous preterm       O09.219  delivery, antepartum x3 (36 wks)  Poor obstetric history: Previous fetal growth  O09.299  restriction (FGR)  Rh negative state in antepartum                O36.0190  Low Risk NIPS(Negative Horizon)  Fetal choroid plexus cyst (normal variant)     O35.8XX0  Encounter for antenatal screening for          Z36.3  malformations  [redacted] weeks gestation of pregnancy  Z3A.21 ---------------------------------------------------------------------- Vital Signs  BP:          116/70 ---------------------------------------------------------------------- Fetal Evaluation  Num Of Fetuses:         1  Fetal Heart Rate(bpm):  140  Cardiac Activity:       Observed   Presentation:           Cephalic  Placenta:               Posterior Fundal  P. Cord Insertion:      Visualized  Amniotic Fluid  AFI FV:      Within normal limits                              Largest Pocket(cm)                              4.84 ---------------------------------------------------------------------- OB History  Gravidity:    4         Term:   3 ---------------------------------------------------------------------- Gestational Age  LMP:           21w 2d        Date:  02/20/23                  EDD:   11/27/23  Best:          [place] 2d     Det. By:  LMP  (02/20/23)          EDD:   11/27/23 ---------------------------------------------------------------------- Targeted Anatomy  Central Nervous System  Calvarium/Cranial V.:  Appears normal         Cereb./Vermis:          Previously seen  Cavum:                 Appears normal         Cisterna Magna:         Previously seen  Lateral Ventricles:    Previously seen        Midline Falx:           Previously seen  Choroid Plexus:        Appears normal  Spine  Cervical:              Previously seen        Sacral:                 Previously seen  Thoracic:              Previously seen        Shape/Curvature:        Previously seen  Lumbar:                Previously seen  Head/Neck  Lips:                  Previously seen        Profile:                Appears normal  Neck:                  Previously seen        Orbits/Eyes:            Previously seen  Nuchal Fold:           Previously seen        Mandible:  Previously seen  Nasal Bone:            Present                Maxilla:                Previously seen  Thorax  4 Chamber View:        Appears normal         Interventr. Septum:     Previously seen  Cardiac Rhythm:        Normal                 Cardiac Axis:           Previously een  Cardiac Situs:         Previously seen        Diaphragm:              Previously seen  Rt Outflow Tract:      Appears normal         3 Vessel View:          Appears normal   Lt Outflow Tract:      Appears normal         3 V Trachea View:       Appears normal  Aortic Arch:           Previously seen        IVC:                    Previously seen  Ductal Arch:           Previously seen        Crossing:               Previously seen  SVC:                   Previously seen  Abdomen  Ventral Wall:          Previously seen        Lt Kidney:              Appears normal  Cord Insertion:        Previously seen        Rt Kidney:              Appears normal  Situs:                 Previously seen        Bladder:                Appears normal  Stomach:               Appears normal  Extremities  Lt Humerus:            Previously seen        Lt Femur:               Previously seen  Rt Humerus:            Previously seen        Rt Femur:               Previously seen  Lt Forearm:            Previously seen        Lt Lower Leg:           Previously seen  Rt Forearm:  Previously seen        Rt Lower Leg:           Previously seen  Lt Hand:               Previously seen        Lt Foot:                Nml heel/foot  Rt Hand:               Previously seen        Rt Foot:                Nml heel/foot  Other  Umbilical Cord:        Previously seen ---------------------------------------------------------------------- Doppler - Fetal Vessels  Middle Cerebral Artery                                                       PSV   MoM                                                     (cm/s)                                                      28.18  1.06 ---------------------------------------------------------------------- Cervix Uterus Adnexa  Cervix  Not visualized (advanced GA >24wks)  Uterus  No abnormality visualized.  Right Ovary  Not visualized.  Left Ovary  Not visualized.  Cul De Sac  No free fluid seen.  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  On cell-free fetal DNA screening, fetus has Rh  positive antigen.  Obstetrical history significant for 3 term vaginal deliveries.  No history of neonatal transfusions.  All infants, however, had  jaundice.  On today's ultrasound, amniotic fluid is normal good fetal  activity seen.  Fetal growth is appropriate for gestational age.  Fetal anatomical survey was completed and appears normal.  Middle cerebral artery Doppler showed normal peak systolic  velocity measurements (no evidence of fetal anemia).  Because of history of neonatal jaundice and all her children,  we will recommend MCA Doppler studies every 2 weeks. ---------------------------------------------------------------------- Recommendations  -An appointment was made for her to return in 2 weeks for  MCA Doppler studies.  -Repeat anti-D titers at her next prenatal visit. ----------------------------------------------------------------------                  Noralee Space, MD Electronically Signed Final Report   07/19/2023 08:33 am ----------------------------------------------------------------------   [place] MFM OB LIMITED Result Date: 07/19/2023 ----------------------------------------------------------------------  OBSTETRICS REPORT                       (Signed Final 07/19/2023 08:33 am) ---------------------------------------------------------------------- Patient Info  ID #:       [id number]  D.O.B.:  1995-11-25 (27 yrs)(F)  Name:       MAECYN Dowding                 Visit Date: 07/19/2023 07:43 am ---------------------------------------------------------------------- Performed By  Attending:        Noralee Space MD        Referred By:      [facility] MAU/Triage  Performed By:     Dennis Bast RDMS      Location:         [facility] ---------------------------------------------------------------------- Orders  #  Description                           Code        Ordered By  1  [place] MFM MCA DOPPLER                    76821.01    BURK SCHAIBLE  2  [place] MFM OB LIMITED                     76815.01    [facility] ----------------------------------------------------------------------  #  Order #                     Accession #                Episode #  1  [id number]                   [id number]                 [id number]  2  [id number]                   [id number]                 [id number] ---------------------------------------------------------------------- Indications  Maternal care for anti-D [Rh} antibodies,      O36.0120  second trimester, unspecified  Poor obstetric history: Previous preterm       O09.219  delivery, antepartum x3 (36 wks)  Poor obstetric history: Previous fetal growth  O09.299  restriction (FGR)  Rh negative state in antepartum                O36.0190  Low Risk NIPS(Negative Horizon)  Fetal choroid plexus cyst (normal variant)  O35.8XX0  Encounter for antenatal screening for          Z36.3  malformations  [redacted] weeks gestation of pregnancy                Z3A.21 ---------------------------------------------------------------------- Vital Signs  BP:          116/70 ---------------------------------------------------------------------- Fetal Evaluation  Num Of Fetuses:         1  Fetal Heart Rate(bpm):  140  Cardiac Activity:       Observed  Presentation:           Cephalic  Placenta:               Posterior Fundal  P. Cord Insertion:      Visualized  Amniotic Fluid  AFI FV:      Within normal limits                              Largest Pocket(cm)                              4.84 ---------------------------------------------------------------------- OB History  Gravidity:    4         Term:   3 ---------------------------------------------------------------------- Gestational Age  LMP:           21w 2d         Date:  02/20/23                  EDD:   11/27/23  Best:          [place] 2d     Det. By:  LMP  (02/20/23)          EDD:   11/27/23 ---------------------------------------------------------------------- Targeted Anatomy  Central Nervous System  Calvarium/Cranial V.:  Appears normal         Cereb./Vermis:          Previously seen  Cavum:                 Appears normal         Cisterna Magna:         Previously seen  Lateral Ventricles:    Previously seen        Midline Falx:           Previously seen  Choroid Plexus:        Appears normal  Spine  Cervical:              Previously seen        Sacral:                 Previously seen  Thoracic:              Previously seen        Shape/Curvature:        Previously seen  Lumbar:                Previously seen  Head/Neck  Lips:                  Previously seen        Profile:                Appears normal  Neck:                  Previously seen        Orbits/Eyes:  Previously seen  Nuchal Fold:           Previously seen        Mandible:               Previously seen  Nasal Bone:            Present                Maxilla:                Previously seen  Thorax  4 Chamber View:        Appears normal         Interventr. Septum:     Previously seen  Cardiac Rhythm:        Normal                 Cardiac Axis:           Previously een  Cardiac Situs:         Previously seen        Diaphragm:              Previously seen  Rt Outflow Tract:      Appears normal         3 Vessel View:          Appears normal  Lt Outflow Tract:      Appears normal         3 V Trachea View:       Appears normal  Aortic Arch:           Previously seen        IVC:                    Previously seen  Ductal Arch:           Previously seen        Crossing:               Previously seen  SVC:                   Previously seen  Abdomen  Ventral Wall:          Previously seen        Lt Kidney:              Appears normal  Cord Insertion:        Previously seen        Rt Kidney:              Appears normal   Situs:                 Previously seen        Bladder:                Appears normal  Stomach:               Appears normal  Extremities  Lt Humerus:            Previously seen        Lt Femur:               Previously seen  Rt Humerus:            Previously seen        Rt Femur:               Previously seen  Lt Forearm:  Previously seen        Lt Lower Leg:           Previously seen  Rt Forearm:            Previously seen        Rt Lower Leg:           Previously seen  Lt Hand:               Previously seen        Lt Foot:                Nml heel/foot  Rt Hand:               Previously seen        Rt Foot:                Nml heel/foot  Other  Umbilical Cord:        Previously seen ---------------------------------------------------------------------- Doppler - Fetal Vessels  Middle Cerebral Artery                                                       PSV   MoM                                                     (cm/s)                                                      28.18  1.06 ---------------------------------------------------------------------- Cervix Uterus Adnexa  Cervix  Not visualized (advanced GA >24wks)  Uterus  No abnormality visualized.  Right Ovary  Not visualized.  Left Ovary  Not visualized.  Cul De Sac  No free fluid seen.  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  On cell-free fetal DNA screening, fetus has Rh  positive antigen.  Obstetrical history significant for 3 term vaginal deliveries.  No history of neonatal transfusions.  All infants, however, had  jaundice.  On today's ultrasound, amniotic fluid is normal good fetal  activity seen.  Fetal growth is appropriate for gestational age.  Fetal anatomical survey was completed and appears normal.  Middle cerebral artery Doppler showed normal peak systolic  velocity measurements (no  evidence of fetal anemia).  Because of history of neonatal jaundice and all her children,  we will recommend MCA Doppler studies every 2 weeks. ---------------------------------------------------------------------- Recommendations  -An appointment was made for her to return in 2 weeks for  MCA Doppler studies.  -Repeat anti-D titers at her next prenatal visit. ----------------------------------------------------------------------                  Noralee Space, MD Electronically Signed Final Report   07/19/2023 08:33 am ----------------------------------------------------------------------   [place] MFM OB DETAIL +14 WK Result Date: 07/05/2023 ----------------------------------------------------------------------  OBSTETRICS REPORT                       (  Signed Final 07/05/2023 04:22 pm) ---------------------------------------------------------------------- Patient Info  ID #:       [id number]                          D.O.B.:  30-Oct-1995 (27 yrs)(F)  Name:       MADAI Bentson                 Visit Date: 07/05/2023 09:32 am ---------------------------------------------------------------------- Performed By  Attending:        Braxton Feathers DO       Referred By:      [facility] MAU/Triage  Performed By:     Emeline Darling BS,      Location[facility]         [facility]                                     [facility] ---------------------------------------------------------------------- Orders  #  Description                           Code        Ordered By  1  [place] MFM OB DETAIL +14 WK               76811.01    SHANTONETTE                                                       PAYNE  2  [place] MFM MCA DOPPLER                    40981.19    SHANTONETTE                                                       PAYNE  ----------------------------------------------------------------------  #  Order #                     Accession #                Episode #  1  [id number]                   [id number]  [id number]  2  [id number]                   [id number]                 [id number] ---------------------------------------------------------------------- Indications  Maternal care for anti-D [Rh} antibodies,      O36.0120  second trimester, unspecified  Poor obstetric history: Previous preterm       O09.219  delivery, antepartum x3 (36 wks)  Poor obstetric history: Previous fetal growth  O09.299  restriction (FGR)  Rh negative state in antepartum                O36.0190  Encounter for antenatal screening for          Z36.3  malformations  Low Risk NIPS(Negative Horizon)  Fetal choroid plexus cyst (normal variant)     O35.8XX0  [redacted] weeks gestation of pregnancy                Z3A.19 ---------------------------------------------------------------------- Vital Signs  BP:          94/67 ---------------------------------------------------------------------- Fetal Evaluation  Num Of Fetuses:         1  Fetal Heart Rate(bpm):  142  Cardiac Activity:       Observed  Presentation:           Breech  Placenta:               Posterior  P. Cord Insertion:      Visualized  Amniotic Fluid  AFI FV:      Within normal limits                              Largest Pocket(cm)                              4.29 ---------------------------------------------------------------------- Biometry  BPD:      43.9  mm     G. Age:  19w 2d         50  %    CI:        71.07   %    70 - 86                                                          FL/HC:      19.0   %    16.1 - 18.3  HC:      165.9  mm     G. Age:  19w 2d         43  %    HC/AC:      1.13        1.09 - 1.39  AC:      147.2  mm     G. Age:  20w 0d         69  %    FL/BPD:     72.0   %  FL:       31.6  mm     G. Age:  19w 6d         63  %    FL/AC:      21.5   %    20 - 24  HUM:        31  mm      G. Age:  20w 2d         77  %  CER:      19.8  mm     G. Age:  19w 1d         44  %  NFT:       4.4  mm  LV:        8.3  mm  CM:        3.8  mm  Est. FW:     316  gm    0 lb 11 oz      77  % ---------------------------------------------------------------------- OB History  Gravidity:    4         Term:   3 ---------------------------------------------------------------------- Gestational Age  LMP:           19w 2d        Date:  02/20/23                  EDD:   11/27/23  U/S Today:     19w 4d                                        EDD:   11/25/23  Best:          19w 2d     Det. By:  LMP  (02/20/23)          EDD:   11/27/23 ---------------------------------------------------------------------- Targeted Anatomy  Central Nervous System  Calvarium/Cranial V.:  Appears normal         Cereb./Vermis:          Appears normal  Cavum:                 Appears normal         Cisterna Magna:         Appears normal  Lateral Ventricles:    Appears normal         Midline Falx:           Appears normal  Choroid Plexus:        Appears normal  Spine  Cervical:              Appears normal         Sacral:                 Appears normal  Thoracic:              Appears normal         Shape/Curvature:        Appears normal  Lumbar:                Appears normal  Head/Neck  Lips:                  Appears normal         Orbits/Eyes:            Appears normal  Nuchal Fold:           Appears normal         Mandible:               Appears normal  Nasal Bone:            Appears normal  Maxilla:                Appears normal  Profile:               Appears normal  Thorax  Thoracic Contour:      Appears normal         SVC:                    Appears normal  4 Chamber View:        Appears normal         Interventr. Septum:     Appears normal  Cardiac Activity:      Observed               Cardiac Axis:           Appears normal  Cardiac Rhythm:        Appears normal         Diaphragm:              Appears normal  Cardiac Situs:         Appears  normal         3 Vessel View:          Appears normal  Rt Outflow Tract:      Not well visualized    3 V Trachea View:       Appears normal  Lt Outflow Tract:      Not well visualized    IVC:                    Appears normal  Aortic Arch:           Appears normal         Crossing:               Appears normal  Ductal Arch:           Appears normal  Abdomen  Cord Insertion:        Appears normal         Lt Kidney:              Appears normal  Situs:                 Within normal limits   Rt Kidney:              Appears normal  Stomach:               Appears normal         Bladder:                Appears normal  Gallbladder:           Appears normal  Extremities  Lt Humerus:            Appears normal         Lt Femur:               Appears normal  Rt Humerus:            Appears normal         Rt Femur:               Appears normal  Lt Forearm:            Appears normal         Lt Lower Leg:           Appears normal  Rt Forearm:  Appears normal         Rt Lower Leg:           Appears normal  Lt Hand:               Open hand nml          Lt Foot:                Not well visualized  Rt Hand:               Open hand nml          Rt Foot:                Not well visualized  Other  Umbilical Cord:        Normal 3-vessel        Genitalia:              Female-nml ---------------------------------------------------------------------- Doppler - Fetal Vessels  Middle Cerebral Artery                                                       PSV   MoM                                                     (cm/s)                                                       28.9  1.19 ---------------------------------------------------------------------- Cervix Uterus Adnexa  Cervix  Length:            3.2  cm.  Right Ovary  Not visualized.  Left Ovary  Not visualized.  Adnexa  No abnormality visualized ---------------------------------------------------------------------- Comments  MFM CONSULT  Lenea Fagin is a 27 y.o. [id number] at  [redacted]w[redacted]d here for  ultrasound and consultation.  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  She reports that  none of her previous children required a blood transfusion but  they did have significant jaundice.  Notably this pregnancy  was achieved with a different father.  I discussed that based  on her blood work she has anti-D antibodies.  She denies  recent administration of RhoGAM, therefore this antibody is  compatible and consistent with alloimmunization.  I discussed  the concern for fetal anemia that can be life-threatening if not  monitored appropriately.  We discussed the typical prenatal  course including four options: Paternal antigen testing, cell  free DNA for fetal blood typing, amniocentesis for fetal blood  typing and middle cerebral artery monitoring for fetal anemia.  Since the patient already completed a panorama test for  aneuploidy, she elected to have the fetal Rh blood typing  added to the order.  She will continue MCA Dopplers until  these results have finalized.  Sonographic findings  Single intrauterine pregnancy at 19w 2d.  Fetal cardiac activity:  Observed and appears normal.  Presentation: Breech.  The anatomic structures that were  well seen appear normal.  There is a CPC which is a normal variant. Due to poor  acoustic windows some structures remain suboptimally  visualized.  Fetal biometry shows the estimated fetal weight at the 77  percentile.  Amniotic fluid:  MVP: 4.29 cm.  Placenta: Posterior.  Adnexa: No abnormality visualized.  Cervical length: 3.2 cm.  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, single or unspecified fetus  -Rh negative state in antepartum period  Plan  - Fetal RH blood typing through Panorama  - F/u in 2 weeks for limited US and MCA dopplers ----------------------------------------------------------------------                  Braxton Feathers, DO Electronically Signed Final  Report   07/05/2023 04:22 pm ----------------------------------------------------------------------   [place] MFM MCA DOPPLER Result Date: 07/05/2023 ----------------------------------------------------------------------  OBSTETRICS REPORT                       (Signed Final 07/05/2023 04:22 pm) ---------------------------------------------------------------------- Patient Info  ID #:       [id number]                          D.O.B.:  1996/02/20 (27 yrs)(F)  Name:       Brandy Alberts Nied                 Visit Date: 07/05/2023 09:32 am ---------------------------------------------------------------------- Performed By  Attending:        Braxton Feathers DO       Referred By:      [facility] MAU/Triage  Performed By:     Emeline Darling BS,      [facility] ---------------------------------------------------------------------- Orders  #  Description                           Code        Ordered By  1  [place] MFM OB DETAIL +14 WK               76811.01    SHANTONETTE  PAYNE  2  [place] MFM MCA DOPPLER                    [id number]    SHANTONETTE                                                       PAYNE ----------------------------------------------------------------------  #  Order #                     Accession #                Episode #  1  [id number]                   [id number]                 [id number]  2  [id number]                   [id number]                 [id number] ---------------------------------------------------------------------- Indications  Maternal care for anti-D [Rh} antibodies,      O36.0120  second trimester, unspecified  Poor obstetric history: Previous preterm       O09.219  delivery, antepartum x3 (36 wks)  Poor obstetric history:  Previous fetal growth  O09.299  restriction (FGR)  Rh negative state in antepartum                O36.0190  Encounter for antenatal screening for          Z36.3  malformations  Low Risk NIPS(Negative Horizon)  Fetal choroid plexus cyst (normal variant)     O35.8XX0  [redacted] weeks gestation of pregnancy                Z3A.19 ---------------------------------------------------------------------- Vital Signs  BP:          94/67 ---------------------------------------------------------------------- Fetal Evaluation  Num Of Fetuses:         1  Fetal Heart Rate(bpm):  142  Cardiac Activity:       Observed  Presentation:           Breech  Placenta:               Posterior  P. Cord Insertion:      Visualized  Amniotic Fluid  AFI FV:      Within normal limits                              Largest Pocket(cm)                              4.29 ---------------------------------------------------------------------- Biometry  BPD:      43.9  mm     G. Age:  19w 2d         50  %    CI:        71.07   %    70 - 86  FL/HC:      19.0   %    16.1 - 18.3  HC:      165.9  mm     G. Age:  19w 2d         43  %    HC/AC:      1.13        1.09 - 1.39  AC:      147.2  mm     G. Age:  20w 0d         69  %    FL/BPD:     72.0   %  FL:       31.6  mm     G. Age:  19w 6d         63  %    FL/AC:      21.5   %    20 - 24  HUM:        31  mm     G. Age:  20w 2d         77  %  CER:      19.8  mm     G. Age:  19w 1d         44  %  NFT:       4.4  mm  LV:        8.3  mm  CM:        3.8  mm  Est. FW:     316  gm    0 lb 11 oz      77  % ---------------------------------------------------------------------- OB History  Gravidity:    4         Term:   3 ---------------------------------------------------------------------- Gestational Age  LMP:           19w 2d        Date:  02/20/23                  EDD:   11/27/23  U/S Today:     19w 4d                                        EDD:   11/25/23  Best:           19w 2d     Det. By:  LMP  (02/20/23)          EDD:   11/27/23 ---------------------------------------------------------------------- Targeted Anatomy  Central Nervous System  Calvarium/Cranial V.:  Appears normal         Cereb./Vermis:          Appears normal  Cavum:                 Appears normal         Cisterna Magna:         Appears normal  Lateral Ventricles:    Appears normal         Midline Falx:           Appears normal  Choroid Plexus:        Appears normal  Spine  Cervical:              Appears normal         Sacral:                 Appears normal  Thoracic:              Appears normal         Shape/Curvature:        Appears normal  Lumbar:                Appears normal  Head/Neck  Lips:                  Appears normal         Orbits/Eyes:            Appears normal  Nuchal Fold:           Appears normal         Mandible:               Appears normal  Nasal Bone:            Appears normal         Maxilla:                Appears normal  Profile:               Appears normal  Thorax  Thoracic Contour:      Appears normal         SVC:                    Appears normal  4 Chamber View:        Appears normal         Interventr. Septum:     Appears normal  Cardiac Activity:      Observed               Cardiac Axis:           Appears normal  Cardiac Rhythm:        Appears normal         Diaphragm:              Appears normal  Cardiac Situs:         Appears normal         3 Vessel View:          Appears normal  Rt Outflow Tract:      Not well visualized    3 V Trachea View:       Appears normal  Lt Outflow Tract:      Not well visualized    IVC:                    Appears normal  Aortic Arch:           Appears normal         Crossing:               Appears normal  Ductal Arch:           Appears normal  Abdomen  Cord Insertion:        Appears normal         Lt Kidney:              Appears normal  Situs:                 Within normal limits   Rt Kidney:              Appears normal  Stomach:               Appears  normal  Bladder:                Appears normal  Gallbladder:           Appears normal  Extremities  Lt Humerus:            Appears normal         Lt Femur:               Appears normal  Rt Humerus:            Appears normal         Rt Femur:               Appears normal  Lt Forearm:            Appears normal         Lt Lower Leg:           Appears normal  Rt Forearm:            Appears normal         Rt Lower Leg:           Appears normal  Lt Hand:               Open hand nml          Lt Foot:                Not well visualized  Rt Hand:               Open hand nml          Rt Foot:                Not well visualized  Other  Umbilical Cord:        Normal 3-vessel        Genitalia:              Female-nml ---------------------------------------------------------------------- Doppler - Fetal Vessels  Middle Cerebral Artery                                                       PSV   MoM                                                     (cm/s)                                                       28.9  1.19 ---------------------------------------------------------------------- Cervix Uterus Adnexa  Cervix  Length:            3.2  cm.  Right Ovary  Not visualized.  Left Ovary  Not visualized.  Adnexa  No abnormality visualized ---------------------------------------------------------------------- Comments  MFM CONSULT  Hera Zegarra is a 27 y.o. [id number] at [redacted]w[redacted]d here for  ultrasound and consultation.  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  She reports that  none of her previous children required a blood transfusion  but  they did have significant jaundice.  Notably this pregnancy  was achieved with a different father.  I discussed that based  on her blood work she has anti-D antibodies.  She denies  recent administration of RhoGAM, therefore this antibody is  compatible and consistent with alloimmunization.  I discussed  the concern for fetal anemia  that can be life-threatening if not  monitored appropriately.  We discussed the typical prenatal  course including four options: Paternal antigen testing, cell  free DNA for fetal blood typing, amniocentesis for fetal blood  typing and middle cerebral artery monitoring for fetal anemia.  Since the patient already completed a panorama test for  aneuploidy, she elected to have the fetal Rh blood typing  added to the order.  She will continue MCA Dopplers until  these results have finalized.  Sonographic findings  Single intrauterine pregnancy at 19w 2d.  Fetal cardiac activity:  Observed and appears normal.  Presentation: Breech.  The anatomic structures that were well seen appear normal.  There is a CPC which is a normal variant. Due to poor  acoustic windows some structures remain suboptimally  visualized.  Fetal biometry shows the estimated fetal weight at the 77  percentile.  Amniotic fluid:  MVP: 4.29 cm.  Placenta: Posterior.  Adnexa: No abnormality visualized.  Cervical length: 3.2 cm.  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, single or unspecified fetus  -Rh negative state in antepartum period  Plan  - Fetal RH blood typing through Panorama  - F/u in 2 weeks for limited US and MCA dopplers ----------------------------------------------------------------------                  Braxton Feathers, DO Electronically Signed Final Report   07/05/2023 04:22 pm ----------------------------------------------------------------------   [place] MFM OB LIMITED Result Date: 07/03/2023 ----------------------------------------------------------------------  OBSTETRICS REPORT                        (Signed Final 07/03/2023 07:29 pm) ---------------------------------------------------------------------- Patient Info  ID #:       [id number]                          D.O.B.:  05-11-1996 (27 yrs)(F)  Name:       Brandy Alberts Stipes                 Visit Date: 07/03/2023 05:15 pm  ---------------------------------------------------------------------- Performed By  Attending:        Lin Landsman      Referred By:       [facility] MAU/Triage                    MD  Performed By:     Isabel Caprice        [facility] ---------------------------------------------------------------------- Orders  #  Description                           Code        Ordered By  1  [place] MFM OB LIMITED                     [id number]    SARA [place] ----------------------------------------------------------------------  #  Order #                     Accession #                Episode #  1  [id number]                   [id number]                 [id number] ---------------------------------------------------------------------- Indications  [redacted] weeks gestation of pregnancy                 Z3A.19  Abdominal pain in pregnancy (fall)              O99.89 ---------------------------------------------------------------------- Fetal Evaluation  Num Of Fetuses:          1  Fetal Heart Rate(bpm):   149  Cardiac Activity:        Observed  Presentation:            Breech  Placenta:                Posterior  Amniotic Fluid  AFI FV:      Within normal limits                              Largest Pocket(cm)                              5. ---------------------------------------------------------------------- OB History  Gravidity:    4         Term:   3 ---------------------------------------------------------------------- Gestational Age  LMP:           19w 0d        Date:  02/20/23                  EDD:   11/27/23  Best:          19w 0d     Det. By:  LMP  (02/20/23)          EDD:   11/27/23 ---------------------------------------------------------------------- Anatomy  Diaphragm:             Appears normal         Cord Vessels:           Appears normal (3                                                                        vessel cord)   Stomach:               Appears normal, left   Kidneys:                Appear normal                         sided  Abdomen:  Appears normal         Bladder:                Appears normal ---------------------------------------------------------------------- Impression  Limited exam due to maternal abdominal pain  Good fetal movement and amniotic fluid volume  No evidence of placental abruption or previal ---------------------------------------------------------------------- Recommendations  Clinical correlation recommended. ----------------------------------------------------------------------               Lin Landsman, MD Electronically Signed Final Report   07/03/2023 07:29 pm ----------------------------------------------------------------------    MAU Course/MDM: I have reviewed the triage vital signs and the nursing notes.   Pertinent labs & imaging results that were available during my care of the patient were reviewed by me and considered in my medical decision making (see chart for details).      I have reviewed her medical records including past results, notes and treatments.   I have ordered labs and reviewed results.  NST reviewed  Treatments in MAU included Tylenol, caffeine, flexeril, 1L LR bolus.    Assessment: 1. [redacted] weeks gestation of pregnancy   2. Persistent migraine aura without status migrainosus, not intractable   Chronic migraine with aura exacerbated by pregnancy - Trial of Tylenol, Caffeine, Flexeril, 1L LR bolus - much improved on reassessment and tolerating PO.  Nausea - previously treated with Zofran and scopolamine patch(removed yesterday due to skin irritation) - Trial Zofran - nausea resolved.   Plan: Discharge home Preterm labor precautions and fetal kick counts Follow up in Office for prenatal visits and recheck  Follow-up Information     [facility] Follow up.   Specialty: Obstetrics  and Gynecology Why: As scheduled for prenatal care Contact information: [address] [place] [address] [phone]               Pt stable at time of discharge.  Wyn Forster, MD FMOB Fellow, Faculty practice [facility], [facility]  07/30/2023 5:02 PM

## 2023-07-30 NOTE — MAU Note (Signed)
..  Brandy [name] is a 27 y.o. at [redacted]w[redacted]d here in MAU reporting: headache, weakness, numbness and tingling in hands and feet that started x 2 days ago. Patient states that she has had some intermittent nausea since yesterday.  Has not been able to urinate much in 2 days. Has been drinking fluids and Gatorade, is able to tolerate foods. Endorses good FM. Denies LOF and VB.  LMP: 02/20/2023 Onset of complaint: 2 days ago Pain score: none Vitals:   07/30/23 1209 07/30/23 1213  BP: 112/64 112/64  Pulse: 74 74  Resp: 16 16  Temp: 99.3 F (37.4 C) 99.3 F (37.4 C)  SpO2: 100%      FHT:157 Lab orders placed from triage: asked for patient to provide urine specimen. Patient unable to void

## 2023-08-02 ENCOUNTER — Other Ambulatory Visit: Payer: Self-pay

## 2023-08-02 ENCOUNTER — Ambulatory Visit: Payer: Medicaid Other | Attending: Maternal & Fetal Medicine

## 2023-08-02 ENCOUNTER — Ambulatory Visit (INDEPENDENT_AMBULATORY_CARE_PROVIDER_SITE_OTHER): Payer: Medicaid Other | Admitting: Obstetrics & Gynecology

## 2023-08-02 VITALS — BP 108/68 | HR 74 | Wt 153.5 lb

## 2023-08-02 DIAGNOSIS — O09899 Supervision of other high risk pregnancies, unspecified trimester: Secondary | ICD-10-CM | POA: Insufficient documentation

## 2023-08-02 DIAGNOSIS — Z3A23 23 weeks gestation of pregnancy: Secondary | ICD-10-CM

## 2023-08-02 DIAGNOSIS — O09212 Supervision of pregnancy with history of pre-term labor, second trimester: Secondary | ICD-10-CM | POA: Diagnosis not present

## 2023-08-02 DIAGNOSIS — Z3182 Encounter for Rh incompatibility status: Secondary | ICD-10-CM | POA: Insufficient documentation

## 2023-08-02 DIAGNOSIS — O365920 Maternal care for other known or suspected poor fetal growth, second trimester, not applicable or unspecified: Secondary | ICD-10-CM | POA: Diagnosis not present

## 2023-08-02 DIAGNOSIS — O26899 Other specified pregnancy related conditions, unspecified trimester: Secondary | ICD-10-CM

## 2023-08-02 DIAGNOSIS — O09292 Supervision of pregnancy with other poor reproductive or obstetric history, second trimester: Secondary | ICD-10-CM

## 2023-08-02 DIAGNOSIS — O365930 Maternal care for other known or suspected poor fetal growth, third trimester, not applicable or unspecified: Secondary | ICD-10-CM

## 2023-08-02 DIAGNOSIS — O360120 Maternal care for anti-D [Rh] antibodies, second trimester, not applicable or unspecified: Secondary | ICD-10-CM

## 2023-08-02 DIAGNOSIS — O361920 Maternal care for other isoimmunization, second trimester, not applicable or unspecified: Secondary | ICD-10-CM

## 2023-08-02 DIAGNOSIS — Z6791 Unspecified blood type, Rh negative: Secondary | ICD-10-CM

## 2023-08-02 DIAGNOSIS — O0990 Supervision of high risk pregnancy, unspecified, unspecified trimester: Secondary | ICD-10-CM

## 2023-08-02 NOTE — Progress Notes (Signed)
PRENATAL VISIT NOTE  Subjective:  Brandy [name] is a 27 y.o. [id number] at [redacted]w[redacted]d being seen today for ongoing prenatal care.  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  Patient reports no complaints.  Contractions: Not present. Vag. Bleeding: None.  Movement: Present. Denies leaking of fluid.   The following portions of the patient's history were reviewed and updated as appropriate: allergies, current medications, past family history, past medical history, past social history, past surgical history and problem list.   Objective:   Vitals:   08/02/23 0846  BP: 108/68  Pulse: 74  Weight: 153 lb 8 oz (69.6 kg)    Fetal Status: Fetal Heart Rate (bpm): 143   Movement: Present     General:  Alert, oriented and cooperative. Patient is in no acute distress.  Skin: Skin is warm and dry. No rash noted.   Cardiovascular: Normal heart rate noted  Respiratory: Normal respiratory effort, no problems with respiration noted  Abdomen: Soft, gravid, appropriate for gestational age.  Pain/Pressure: Present     Pelvic: Cervical exam deferred        Extremities: Normal range of motion.  Edema: None  Mental Status: Normal mood and affect. Normal behavior. Normal judgment and thought content.   [place] MFM MCA DOPPLER Result Date: 08/02/2023 ----------------------------------------------------------------------  OBSTETRICS REPORT                       (Signed Final 08/02/2023 08:26 am) ---------------------------------------------------------------------- Patient Info  ID #:       [id number]                          D.O.B.:  June 14, 1996  (27 yrs)(F)  Name:       Brandy [name]                 Visit Date: 08/02/2023 07:30 am ---------------------------------------------------------------------- Performed By  Attending:        Braxton Feathers DO       Referred By:      [facility] MAU/Triage  Performed By:     Dennis Bast RDMS      [facility] ---------------------------------------------------------------------- Orders  #  Description                           Code        Ordered By  1  [place] MFM MCA DOPPLER                    76821.01    [employer]  2  [place] MFM OB FOLLOW UP                   [id number]    Braxton Feathers ----------------------------------------------------------------------  #  Order #                     Accession #                Episode #  1  [id number]                   [id number]                 [id number]  2  [id number]                   [id number]                 [id number] ---------------------------------------------------------------------- Indications  Maternal care for known or suspected poor      O36.5921  fetal growth, second trimester  Maternal care for anti-D [Rh} antibodies,      O36.0120  second trimester, unspecified  Poor obstetric history: Previous preterm       O09.219  delivery, antepartum x3 (36 wks)  Poor obstetric history: Previous fetal growth  O09.299  restriction (FGR)  Rh negative state in antepartum                O36.0190  Low Risk NIPS(Negative Horizon)  Fetal choroid plexus cyst (normal variant)     O35.8XX0  (resolved)  Encounter for antenatal screening for          Z36.3  malformations  [redacted] weeks gestation of pregnancy                Z3A.23 ---------------------------------------------------------------------- Vital Signs  BP:          112/68  ---------------------------------------------------------------------- Fetal Evaluation  Num Of Fetuses:         1  Fetal Heart Rate(bpm):  133  Cardiac Activity:       Observed  Presentation:           Breech  Placenta:               Posterior  P. Cord Insertion:      Previously seen  Amniotic Fluid  AFI FV:      Within normal limits  AFI Sum(cm)     %Tile       Largest Pocket(cm)  16.37           61          5.47  RUQ(cm)       RLQ(cm)       LUQ(cm)        LLQ(cm)  3.96          4.7           2.24           5.47 ---------------------------------------------------------------------- Biometry  BPD:      55.2  mm     G. Age:  22w 6d         28  %    CI:           76   %    70 - 86                                                          FL/HC:      20.1   %    19.2 - 20.8  HC:      200.7  mm     G. Age:  22w 2d          6  %    HC/AC:      1.23        1.05 - 1.21  AC:      162.6  mm     G. Age:  21w 2d          3  %    FL/BPD:     73.0   %    71 - 87  FL:       40.3  mm     G. Age:  23w 0d         30  %    FL/AC:      24.8   %    20 - 24  LV:        5.4  mm  Est. FW:     479  gm      1 lb 1 oz      6  % ---------------------------------------------------------------------- OB History  Gravidity:    4         Term:   3 ---------------------------------------------------------------------- Gestational Age  LMP:           23w 2d        Date:  02/20/23                  EDD:   11/27/23  U/S Today:     22w 3d                                        EDD:   12/03/23  Best:          23w 2d     Det. By:  LMP  (02/20/23)          EDD:   11/27/23 ---------------------------------------------------------------------- Anatomy  Ventricles:            Appears normal         Kidneys:                Appear normal  Heart:                 Visualized             Bladder:                Appears normal  Stomach:               Appears normal, left                         sided ----------------------------------------------------------------------  Doppler - Fetal Vessels  Umbilical  Artery   S/D     %tile      RI    %tile                      PSV    ADFV    RDFV                                                     (cm/s)   4.28       78    0.77       79                     29.86      No      No  Middle Cerebral Artery   S/D                RI                               PSV   MoM                                                     (cm/s)   4.28             0.77                              32.41  1.1 ---------------------------------------------------------------------- Cervix Uterus Adnexa  Cervix  Normal appearance by transabdominal scan  Uterus  No abnormality visualized.  Right Ovary  Not visualized.  Left Ovary  Not visualized.  Cul De Sac  No free fluid seen.  Adnexa  No abnormality visualized ---------------------------------------------------------------------- Comments  The patient is here for a follow-up ultrasound for FGR at 23w  2d. EDD: 11/27/2023. Dating: LMP  (02/20/23). She is here  for FGR and alloimmunization (anti-D). She has no concerns  today.  Sonographic findings  Single intrauterine pregnancy.  Fetal cardiac activity:  Observed and appears normal.  Presentation: Breech.  Interval fetal anatomy appears normal.  Fetal biometry shows the estimated fetal weight at the 6th  percentile and the abdominal circumference at the 3rd  percentile.  Amniotic fluid volume: Within normal limits. AFI: 16.37 cm.  MVP: 5.47 cm.  Placenta: Posterior.  Umbilical artery dopplers findings:  -S/D: 4.28 which are normal at this gestational age.  -Absent end-diastolic flow: No.  -Reversed end-diastolic flow:  No.  MCA doppleres were normal.  Normal fetal movement and tone.  Recommendations  - UA and MCA dopplers in 2 weeks  - Serial growth [place] every 3 weeks until delivery.  - Delivery timing pending clinical course but if FGR persists  then likely no later than 38 weeks.  There are limitations of prenatal ultrasound such as the  inability to detect certain  abnormalities due to poor  visualization. Various factors such as fetal position,  gestational age and maternal body habitus may increase the  difficulty in visualizing the fetal anatomy. ----------------------------------------------------------------------  Braxton Feathers, DO Electronically Signed Final Report   08/02/2023 08:26 am ----------------------------------------------------------------------   [place] MFM OB FOLLOW UP Result Date: 08/02/2023 ----------------------------------------------------------------------  OBSTETRICS REPORT                       (Signed Final 08/02/2023 08:26 am) ---------------------------------------------------------------------- Patient Info  ID #:       [id number]                          D.O.B.:  12/31/1995 (27 yrs)(F)  Name:       Brandy [name]                 Visit Date: 08/02/2023 07:30 am ---------------------------------------------------------------------- Performed By  Attending:        Braxton Feathers DO       Referred By:      [facility] MAU/Triage  Performed By:     Dennis Bast RDMS      [facility] ---------------------------------------------------------------------- Orders  #  Description                           Code        Ordered By  1  [place] MFM MCA DOPPLER                    76821.01    [employer]  2  [place] MFM OB FOLLOW UP                   76816.01    [facility] ----------------------------------------------------------------------  #  Order #                     Accession #                Episode #  1  [id number]                   [id number]                 [id number]  2  [id number]                   [id number]  [id number] ----------------------------------------------------------------------  Indications  Maternal care for known or suspected poor      O36.5921  fetal growth, second trimester  Maternal care for anti-D [Rh} antibodies,      O36.0120  second trimester, unspecified  Poor obstetric history: Previous preterm       O09.219  delivery, antepartum x3 (36 wks)  Poor obstetric history: Previous fetal growth  O09.299  restriction (FGR)  Rh negative state in antepartum                O36.0190  Low Risk NIPS(Negative Horizon)  Fetal choroid plexus cyst (normal variant)     O35.8XX0  (resolved)  Encounter for antenatal screening for          Z36.3  malformations  [redacted] weeks gestation of pregnancy                Z3A.23 ---------------------------------------------------------------------- Vital Signs  BP:          112/68 ---------------------------------------------------------------------- Fetal Evaluation  Num Of Fetuses:         1  Fetal Heart Rate(bpm):  133  Cardiac Activity:       Observed  Presentation:           Breech  Placenta:               Posterior  P. Cord Insertion:      Previously seen  Amniotic Fluid  AFI FV:      Within normal limits  AFI Sum(cm)     %Tile       Largest Pocket(cm)  16.37           61          5.47  RUQ(cm)       RLQ(cm)       LUQ(cm)        LLQ(cm)  3.96          4.7           2.24           5.47 ---------------------------------------------------------------------- Biometry  BPD:      55.2  mm     G. Age:  22w 6d         28  %    CI:           76   %    70 - 86                                                          FL/HC:      20.1   %    19.2 - 20.8  HC:      200.7  mm     G. Age:  22w 2d          6  %    HC/AC:      1.23        1.05 - 1.21  AC:      162.6  mm     G. Age:  21w 2d          3  %    FL/BPD:     73.0   %    71 - 87  FL:       40.3  mm     G. Age:  23w 37d  30  %    FL/AC:      24.8   %    20 - 24  LV:        5.4  mm  Est. FW:     479  gm      1 lb 1 oz      6  % ---------------------------------------------------------------------- OB History   Gravidity:    4         Term:   3 ---------------------------------------------------------------------- Gestational Age  LMP:           23w 2d        Date:  02/20/23                  EDD:   11/27/23  U/S Today:     22w 3d                                        EDD:   12/03/23  Best:          23w 2d     Det. By:  LMP  (02/20/23)          EDD:   11/27/23 ---------------------------------------------------------------------- Anatomy  Ventricles:            Appears normal         Kidneys:                Appear normal  Heart:                 Visualized             Bladder:                Appears normal  Stomach:               Appears normal, left                         sided ---------------------------------------------------------------------- Doppler - Fetal Vessels  Umbilical Artery   S/D     %tile      RI    %tile                      PSV    ADFV    RDFV                                                     (cm/s)   4.28       78    0.77       79                     29.86      No      No  Middle Cerebral Artery   S/D                RI                               PSV   MoM                                                     (  cm/s)   4.28             0.77                              32.41  1.1 ---------------------------------------------------------------------- Cervix Uterus Adnexa  Cervix  Normal appearance by transabdominal scan  Uterus  No abnormality visualized.  Right Ovary  Not visualized.  Left Ovary  Not visualized.  Cul De Sac  No free fluid seen.  Adnexa  No abnormality visualized ---------------------------------------------------------------------- Comments  The patient is here for a follow-up ultrasound for FGR at 23w  2d. EDD: 11/27/2023. Dating: LMP  (02/20/23). She is here  for FGR and alloimmunization (anti-D). She has no concerns  today.  Sonographic findings  Single intrauterine pregnancy.  Fetal cardiac activity:  Observed and appears normal.  Presentation: Breech.  Interval fetal anatomy appears  normal.  Fetal biometry shows the estimated fetal weight at the 6th  percentile and the abdominal circumference at the 3rd  percentile.  Amniotic fluid volume: Within normal limits. AFI: 16.37 cm.  MVP: 5.47 cm.  Placenta: Posterior.  Umbilical artery dopplers findings:  -S/D: 4.28 which are normal at this gestational age.  -Absent end-diastolic flow: No.  -Reversed end-diastolic flow:  No.  MCA doppleres were normal.  Normal fetal movement and tone.  Recommendations  - UA and MCA dopplers in 2 weeks  - Serial growth [place] every 3 weeks until delivery.  - Delivery timing pending clinical course but if FGR persists  then likely no later than 38 weeks.  There are limitations of prenatal ultrasound such as the  inability to detect certain abnormalities due to poor  visualization. Various factors such as fetal position,  gestational age and maternal body habitus may increase the  difficulty in visualizing the fetal anatomy. ----------------------------------------------------------------------                   Braxton Feathers, DO Electronically Signed Final Report   08/02/2023 08:26 am ----------------------------------------------------------------------    Assessment and Plan:  Pregnancy: [id number] at [redacted]w[redacted]d 1. 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 Rh negative state in antepartum period Had scans, dopplers as per MFM today, will continue as per MFM recommendations. Antibody screen and titer to be checked next visit, will get Rhogam then.  3. Poor fetal growth affecting management of mother in third trimester, single or unspecified fetus EFW 6%, follow up scans and MFM recommendations. History of FGR in previous pregnancy.  4. History of preterm delivery, currently pregnant PTL precautions reviewed.  5. [redacted] weeks gestation of pregnancy 6. Supervision of high risk pregnancy, antepartum GTT and other labs next visit, future orders placed. Information  given about labs, recommended vaccines.  - CBC; Future - Glucose Tolerance, 2 Hours w/1 Hour; Future - RPR; Future - HIV Antibody (routine testing w rflx); Future - Antibody screen; Future Preterm labor symptoms and general obstetric precautions including but not limited to vaginal bleeding, contractions, leaking of fluid and fetal movement were reviewed in detail with the patient. Please refer to After Visit Summary for other counseling recommendations.   Return in about 4 weeks (around 08/30/2023) for 2 hr GTT, 3rd trimester labs, TDap, Rhogam, OFFICE OB VISIT (MD only).  Future Appointments  Date Time Provider Department Center  08/05/2023 10:15 AM Helmus, Jitka, PT [facility] None  08/13/2023 11:00 AM Helmus, Jitka, PT [facility] None  08/16/2023  3:30 PM [facility] US[facility]  [facility]  08/17/2023 11:00 AM Menke, Shaunda, PT [facility] None  08/19/2023 11:00 AM Menke, Shaunda, PT [facility] None  08/24/2023 11:00 AM Menke, Shaunda, PT [facility] None  08/26/2023 11:00 AM Menke, Shaunda, PT [facility] None  08/30/2023  3:30 PM [facility]2 [facility]  08/31/2023 11:00 AM Menke, Shaunda, PT [facility] None  09/02/2023 11:00 AM Menke, Shaunda, PT [facility] None  09/07/2023 11:00 AM Menke, Shaunda, PT [facility] None  09/09/2023 11:00 AM Menke, Shaunda, PT [facility] None  09/14/2023 11:00 AM Menke, Shaunda, PT [facility] None  09/16/2023 11:00 AM Menke, Shaunda, PT [facility] None  09/21/2023 11:45 AM Helmus, Jitka, PT [facility] None  09/23/2023  2:00 PM Helmus, Jitka, PT [facility] None  09/28/2023 11:45 AM Helmus, Jitka, PT [facility] None  09/30/2023  2:00 PM Helmus, Jitka, PT [facility] None    Jaynie Collins, MD

## 2023-08-02 NOTE — Patient Instructions (Signed)
 Return to office for any scheduled appointments. Call the office or go to the MAU at [facility] at [facility] if: You begin to have strong, frequent contractions Your water breaks.  Sometimes it is a big gush of fluid, sometimes it is just a trickle that keeps getting your underwear wet or running down your legs You have vaginal bleeding.  It is normal to have a small amount of spotting if your cervix was checked.  You do not feel your baby moving like normal.  If you do not, get something to eat and drink and lay down and focus on feeling your baby move.   If your baby is still not moving like normal, you should call the office or go to MAU. Any other obstetric concerns.  Oral Glucose Tolerance Test During Pregnancy Why am I having this test? The oral glucose tolerance test (GTT) is done to check how your body processes blood sugar (glucose). This is one of several tests used to diagnose diabetes that develops during pregnancy (gestational diabetes mellitus). Gestational diabetes is a short-term form of diabetes that some women develop while they are pregnant. It usually occurs during the second or third trimester of pregnancy and goes away after delivery. Testing, or screening, for gestational diabetes usually occurs around 73 of pregnancy. This test may also be needed earlier if: You have a history of gestational diabetes. There is a history of giving birth to very large babies or of losing pregnancies (having stillbirths). You have signs and symptoms of diabetes, such as: Changes in your eyesight. Tingling or numbness in your hands or feet. Changes in hunger, thirst, and urination, and these are not explained by your pregnancy. What is being tested? This test measures the amount of glucose in your blood at different times during a period of 2 hours. This shows how well your body can process glucose.  You will have three separate blood draws. What kind of sample is  taken?  Blood samples are required for this test. They are usually collected by inserting a needle into a blood vessel. How do I prepare for this test? For 3 days before your test, eat normally. Have plenty of carbohydrate-rich foods. You will be asked not to eat or drink anything other than water (to fast) starting 8-10 hours before the test. Tell a health care provider about: All medicines you are taking, including vitamins, herbs, eye drops, creams, and over-the-counter medicines. Any blood disorders you have. Any surgeries you have had. Any medical conditions you have. What happens during the test? First, your blood glucose will be measured. This is referred to as your fasting blood glucose because you fasted before the test. Then, you will drink a glucose solution that contains a certain amount of glucose. Your blood glucose will be measured again 1 and 2 hours after you drink the solution. This test takes about 2 hours to complete. You will need to stay at the testing location during this time. During the testing period: Do not eat or drink anything other than the glucose solution. Do not exercise. Do not use any products that contain nicotine or tobacco, such as cigarettes, e-cigarettes, and chewing tobacco. These can affect your test results. If you need help quitting, ask your health care provider. The testing procedure may vary among health care providers and hospitals. How are the results reported? Your results will be reported as milligrams of glucose per deciliter of blood (mg/dL) or millimoles per liter (mmol/L). There  is more than one source for screening and diagnosis reference values used to diagnose gestational diabetes. Your health care provider will compare your results to normal values that were established after testing a large group of people (reference values). Reference values may vary among labs and hospitals. For this test, reference values are: Fasting: 92 mg/dL 1  hour: 952 mg/dL  2 hour: 841 mg/dL   What do the results mean? Results below the reference values are considered normal. If one or more of your blood glucose levels are at or above the reference values, you will be diagnosed with gestational diabetes.  Talk with your health care provider about what your results mean. Questions to ask your health care provider Ask your health care provider, or the department that is doing the test: When will my results be ready? How will I get my results? What are my treatment options? What other tests do I need? What are my next steps? Summary The oral glucose tolerance test (GTT) is one of several tests used to diagnose diabetes that develops during pregnancy (gestational diabetes mellitus). Gestational diabetes is a short-term form of diabetes that some women develop while they are pregnant. You may also have this test if you have any symptoms or risk factors for this type of diabetes. Talk with your health care provider about what your results mean. This information is not intended to replace advice given to you by your health care provider. Make sure you discuss any questions you have with your health care provider.  TDaP Vaccine Pregnancy Get the Whooping Cough Vaccine While You Are Pregnant (CDC)  It is important for women to get the whooping cough vaccine in the third trimester of each pregnancy. Vaccines are the best way to prevent this disease. There are 2 different whooping cough vaccines. Both vaccines combine protection against whooping cough, tetanus and diphtheria, but they are for different age groups: Tdap: for everyone 11 years or older, including pregnant women  DTaP: for children 2 months through 57 years of age  You need the whooping cough vaccine during each of your pregnancies The recommended time to get the shot is during your 27th through 36th week of pregnancy, preferably during the earlier part of this time period. The Centers for  Disease Control and Prevention (CDC) recommends that pregnant women receive the whooping cough vaccine for adolescents and adults (called Tdap vaccine) during the third trimester of each pregnancy. The recommended time to get the shot is during your 27th through 36th week of pregnancy, preferably during the earlier part of this time period. This replaces the original recommendation that pregnant women get the vaccine only if they had not previously received it. The [employer] of [employer] and [employer]necologists and the [employer] support this recommendation.  You should get the whooping cough vaccine while pregnant to pass protection to your baby frame support disabled and/or not supported in this browser  Learn why Vernona Rieger decided to get the whooping cough vaccine in her 3rd trimester of pregnancy and how her baby girl was born with some protection against the disease. Also available on YouTube. After receiving the whooping cough vaccine, your body will create protective antibodies (proteins produced by the body to fight off diseases) and pass some of them to your baby before birth. These antibodies provide your baby some short-term protection against whooping cough in early life. These antibodies can also protect your baby from some of the more serious complications that come along with  whooping cough. Your protective antibodies are at their highest about 2 weeks after getting the vaccine, but it takes time to pass them to your baby. So the preferred time to get the whooping cough vaccine is early in your third trimester. The amount of whooping cough antibodies in your body decreases over time. That is why CDC recommends you get a whooping cough vaccine during each pregnancy. Doing so allows each of your babies to get the greatest number of protective antibodies from you. This means each of your babies will get the best protection possible against this disease.  Getting the  whooping cough vaccine while pregnant is better than getting the vaccine after you give birth Whooping cough vaccination during pregnancy is ideal so your baby will have short-term protection as soon as he is born. This early protection is important because your baby will not start getting his whooping cough vaccines until he is 2 months old. These first few months of life are when your baby is at greatest risk for catching whooping cough. This is also when he's at greatest risk for having severe, potentially life-threating complications from the infection. To avoid that gap in protection, it is best to get a whooping cough vaccine during pregnancy. You will then pass protection to your baby before he is born. To continue protecting your baby, he should get whooping cough vaccines starting at 2 months old. You may never have gotten the Tdap vaccine before and did not get it during this pregnancy. If so, you should make sure to get the vaccine immediately after you give birth, before leaving the hospital or birthing center. It will take about 2 weeks before your body develops protection (antibodies) in response to the vaccine. Once you have protection from the vaccine, you are less likely to give whooping cough to your newborn while caring for him. But remember, your baby will still be at risk for catching whooping cough from others. A recent study looked to see how effective Tdap was at preventing whooping cough in babies whose mothers got the vaccine while pregnant or in the hospital after giving birth. The study found that getting Tdap between 27 through 36 weeks of pregnancy is 85% more effective at preventing whooping cough in babies younger than 2 months old. Blood tests cannot tell if you need a whooping cough vaccine There are no blood tests that can tell you if you have enough antibodies in your body to protect yourself or your baby against whooping cough. Even if you have been sick with whooping cough  in the past or previously received the vaccine, you still should get the vaccine during each pregnancy. Breastfeeding may pass some protective antibodies onto your baby By breastfeeding, you may pass some antibodies you have made in response to the vaccine to your baby. When you get a whooping cough vaccine during your pregnancy, you will have antibodies in your breast milk that you can share with your baby as soon as your milk comes in. However, your baby will not get protective antibodies immediately if you wait to get the whooping cough vaccine until after delivering your baby. This is because it takes about 2 weeks for your body to create antibodies. Learn more about the health benefits of breastfeeding.   RSV Vaccination for Pregnant People  CDC recommends two ways to protect babies from getting very sick with Respiratory Syncytial Virus (RSV):  An RSV vaccination given during pregnancy  Pfizer's vaccine Verdis Frederickson) is recommended for use during  pregnancy. It is given during RSV season to people who are 32 through [redacted] weeks pregnant.  Or, An RSV immunization given directly to infants and some older babies  Babies born to mothers who get RSV vaccine at least 2 weeks before delivery will have protection and, in most cases, should not need an RSV immunization later.    When is RSV season?  In most regions of the [place] RSV season starts in the fall and peaks in the winter, but the timing and severity of RSV season can vary from place to place and year to year.   The goal of maternal RSV vaccination is to protect babies from getting very sick with RSV during their first RSV season.  In most of the [place], this means maternal RSV vaccine will be given in September through January.  Who should get the maternal RSV vaccine?  People who are 6 through [redacted] weeks pregnant during September through January should get one dose of maternal RSV vaccine to protect their babies.  RSV season can vary around the country.   How is the maternal RSV vaccine administered?  Maternal RSV vaccine is given as a shot into the mother's upper arm. Only a single dose (one shot) of maternal RSV vaccine is recommended.   It is not yet known whether another dose might be needed in later pregnancies.  How well does the maternal RSV vaccine work?  When someone gets RSV vaccine, their body responds by making a protein that protects against the virus that causes RSV. The process takes about 2 weeks. When a pregnant person gets RSV vaccine, their protective proteins (called antibodies) also pass to their baby. So, babies who are born at least 2 weeks after their mother gets RSV vaccine are protected at birth, when infants are at the highest risk of severe RSV disease.   The vaccine can reduce a baby's risk of being hospitalized from RSV by 57% in the first six months after birth.  What are the possible side effects of the maternal RSV vaccine?  In the clinical trials, the side effects most often reported by pregnant people who received the maternal RSV vaccine were pain at the injection site, headache, muscle pain, and nausea.  Although not common, a dangerous high blood pressure condition called pre-eclampsia occurred in 1.8% of pregnant people who received the maternal RSV vaccine compared to 1.4% of pregnant people who received a placebo.  The clinical trials identified a small increase in the number of preterm births in vaccinated pregnant people. It is not clear if this is a true safety problem related to RSV vaccine or if this occurred for reasons unrelated to vaccination.  To reduce the potential risk of preterm birth and complications from RSV disease, FDA approved the maternal RSV vaccine for use during weeks 32 through 38 of pregnancy while additional studies are conducted.  FDA is requiring the manufacturer to do additional studies that will look more closely at the potential  risk of preterm births and pregnancy-related high blood pressure issues in mothers, including pre-eclampsia.  Severe allergic reactions to vaccines are rare but can happen after any vaccine and can be life-threatening. If you see signs of a severe allergic reaction after vaccination (hives, swelling of the face and throat, difficulty breathing, a fast heartbeat, dizziness, or weakness), seek immediate medical care by calling 911.  As with any medicine or vaccine there is a very remote chance of the vaccine causing other serious injury  or death after vaccination.  Adverse events following vaccination should be reported to the Vaccine Adverse Event Reporting System (VAERS), even if it's not clear that the vaccine caused the adverse event. You or your doctor can report an adverse event to [facility] and FDA through VAERS. If you need further assistance reporting to VAERS, please email [email or web address] or call [phone].  If you have any questions about side effects from the maternal RSV vaccine, talk with your healthcare provider.  Do I need a prescription for a maternal RSV vaccine?  Until the vaccine available in the office, you will need a prescription to take to a local pharmacy that is providing the vaccine.   How do I pay for the maternal RSV vaccine?  Most private health insurance plans cover the maternal RSV vaccine, but there may be a cost to you depending on your plan.  Contact your insurer to find out.  Medicaid Beginning May 03, 2022, most people with coverage from [facility] and [employer] [facility]) will be guaranteed coverage of all vaccines recommended by the Advisory Committee on Immunization Practice at no cost to them.   Source: [facility] for Immunization and Respiratory Diseases

## 2023-08-04 NOTE — L&D Delivery Note (Signed)
 OB/GYN Faculty Practice Delivery Note  Brandy [name] is a 28 y.o. [id number] s/p SVD at [redacted]w[redacted]d. She was admitted for IOL for oligohydramnios.   ROM: 1h 68m with clear fluid GBS Status: Negative/-- (03/27 1500) Maximum Maternal Temperature: 98.63F   Labor Progress: Initial SVE: 1/thick/posterior. She then progressed to complete.   Delivery Date/Time: 11/05/23 2358 Delivery: Called to room and patient was complete and pushing. Head delivered direct OA. No nuchal cord present. Shoulder and body delivered in usual fashion. Infant with spontaneous cry, placed on mother's abdomen, dried and stimulated. Cord clamped x 2 after 1-minute delay, and cut by father of baby. Cord blood drawn. Placenta delivered spontaneously with gentle cord traction. Fundus firm with massage and Pitocin. Labia, perineum, vagina, and cervix inspected inspected with no lacerations noted.  Baby Weight: pending  Placenta: Sent to pathology Complications: None Lacerations: None EBL: 113 mL Analgesia: Epidural   Infant:  APGAR (1 MIN): 9  APGAR (5 MINS): 9  [name], MD OB Fellow, Faculty Practice [facility], [facility] for [facility]

## 2023-08-05 ENCOUNTER — Encounter: Payer: Medicaid Other | Admitting: Physical Therapy

## 2023-08-12 ENCOUNTER — Telehealth: Payer: Self-pay | Admitting: Lactation Services

## 2023-08-12 NOTE — Telephone Encounter (Signed)
 Received PA request for Scopolamine  patches.   [facility] and they reports order is active, and medication is not available. Rx has not been transferred.   Patient was previously sent a message about transferring to another Pharmacy that she did read.   Tried to call patient to clarify if she still needs the Scopolamine  patches. She did not answer. Asked patient to check her Mychart message and respond.

## 2023-08-13 ENCOUNTER — Ambulatory Visit: Payer: Medicaid Other | Admitting: Physical Therapy

## 2023-08-16 ENCOUNTER — Other Ambulatory Visit: Payer: Self-pay

## 2023-08-16 ENCOUNTER — Ambulatory Visit: Payer: Medicaid Other | Attending: Obstetrics and Gynecology

## 2023-08-16 ENCOUNTER — Other Ambulatory Visit: Payer: Self-pay | Admitting: Obstetrics and Gynecology

## 2023-08-16 VITALS — BP 117/63

## 2023-08-16 DIAGNOSIS — O3503X0 Maternal care for (suspected) central nervous system malformation or damage in fetus, choroid plexus cysts, not applicable or unspecified: Secondary | ICD-10-CM | POA: Diagnosis not present

## 2023-08-16 DIAGNOSIS — O0990 Supervision of high risk pregnancy, unspecified, unspecified trimester: Secondary | ICD-10-CM | POA: Diagnosis present

## 2023-08-16 DIAGNOSIS — O09212 Supervision of pregnancy with history of pre-term labor, second trimester: Secondary | ICD-10-CM

## 2023-08-16 DIAGNOSIS — O365920 Maternal care for other known or suspected poor fetal growth, second trimester, not applicable or unspecified: Secondary | ICD-10-CM | POA: Insufficient documentation

## 2023-08-16 DIAGNOSIS — Z3A25 25 weeks gestation of pregnancy: Secondary | ICD-10-CM

## 2023-08-16 DIAGNOSIS — O360120 Maternal care for anti-D [Rh] antibodies, second trimester, not applicable or unspecified: Secondary | ICD-10-CM | POA: Diagnosis not present

## 2023-08-16 DIAGNOSIS — Z3182 Encounter for Rh incompatibility status: Secondary | ICD-10-CM | POA: Diagnosis present

## 2023-08-16 DIAGNOSIS — O09292 Supervision of pregnancy with other poor reproductive or obstetric history, second trimester: Secondary | ICD-10-CM

## 2023-08-17 ENCOUNTER — Encounter: Payer: Medicaid Other | Admitting: Rehabilitative and Restorative Service Providers"

## 2023-08-17 ENCOUNTER — Other Ambulatory Visit: Payer: Self-pay | Admitting: *Deleted

## 2023-08-17 DIAGNOSIS — O360120 Maternal care for anti-D [Rh] antibodies, second trimester, not applicable or unspecified: Secondary | ICD-10-CM

## 2023-08-19 ENCOUNTER — Ambulatory Visit: Payer: Medicaid Other | Admitting: Rehabilitative and Restorative Service Providers"

## 2023-08-24 ENCOUNTER — Ambulatory Visit
Payer: Medicaid Other | Attending: Obstetrics and Gynecology | Admitting: Rehabilitative and Restorative Service Providers"

## 2023-08-24 ENCOUNTER — Other Ambulatory Visit: Payer: Self-pay

## 2023-08-24 ENCOUNTER — Ambulatory Visit: Payer: Medicaid Other | Attending: Maternal & Fetal Medicine

## 2023-08-24 ENCOUNTER — Ambulatory Visit: Payer: Medicaid Other | Admitting: *Deleted

## 2023-08-24 ENCOUNTER — Encounter: Payer: Self-pay | Admitting: Rehabilitative and Restorative Service Providers"

## 2023-08-24 VITALS — BP 111/63 | HR 83

## 2023-08-24 DIAGNOSIS — O365920 Maternal care for other known or suspected poor fetal growth, second trimester, not applicable or unspecified: Secondary | ICD-10-CM | POA: Diagnosis not present

## 2023-08-24 DIAGNOSIS — O360120 Maternal care for anti-D [Rh] antibodies, second trimester, not applicable or unspecified: Secondary | ICD-10-CM | POA: Diagnosis not present

## 2023-08-24 DIAGNOSIS — Z3A26 26 weeks gestation of pregnancy: Secondary | ICD-10-CM | POA: Diagnosis not present

## 2023-08-24 DIAGNOSIS — M6281 Muscle weakness (generalized): Secondary | ICD-10-CM | POA: Diagnosis present

## 2023-08-24 DIAGNOSIS — R102 Pelvic and perineal pain: Secondary | ICD-10-CM | POA: Diagnosis present

## 2023-08-24 DIAGNOSIS — O0990 Supervision of high risk pregnancy, unspecified, unspecified trimester: Secondary | ICD-10-CM | POA: Diagnosis present

## 2023-08-24 DIAGNOSIS — O09292 Supervision of pregnancy with other poor reproductive or obstetric history, second trimester: Secondary | ICD-10-CM

## 2023-08-24 DIAGNOSIS — R262 Difficulty in walking, not elsewhere classified: Secondary | ICD-10-CM | POA: Insufficient documentation

## 2023-08-24 DIAGNOSIS — O26899 Other specified pregnancy related conditions, unspecified trimester: Secondary | ICD-10-CM | POA: Diagnosis present

## 2023-08-24 DIAGNOSIS — O09212 Supervision of pregnancy with history of pre-term labor, second trimester: Secondary | ICD-10-CM | POA: Diagnosis not present

## 2023-08-24 DIAGNOSIS — R252 Cramp and spasm: Secondary | ICD-10-CM | POA: Insufficient documentation

## 2023-08-24 NOTE — Therapy (Signed)
OUTPATIENT PHYSICAL THERAPY TREATMENT NOTE   Patient Name: Brandy [name] MRN: [id number] DOB:[birth year], 28 y.o., female Today's Date: 08/24/2023  END OF SESSION:  PT End of Session - 08/24/23 1104     Visit Number 3    Date for PT Re-Evaluation 10/01/23    Authorization Type [employer] Medicaid    PT Start Time 1100    PT Stop Time 1140    PT Time Calculation (min) 40 min    Activity Tolerance Patient tolerated treatment well    Behavior During Therapy WFL for tasks assessed/performed             Past Medical History:  Diagnosis Date   Chlamydia    Heart murmur    Diagnosed as a child   Migraine    Past Surgical History:  Procedure Laterality Date   NO PAST SURGERIES     Patient Active Problem List   Diagnosis Date Noted   History of preterm delivery, currently pregnant 07/12/2023   IUGR (intrauterine growth restriction) affecting care of mother 07/12/2023   Supervision of high risk pregnancy, antepartum 04/28/2023   Rh negative state in antepartum period 04/25/2015   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) 04/25/2015   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    [facility]   REFERRING PROVIDER: Warden Fillers, MD Ref Provider  REFERRING DIAG: [phone] (ICD-10-CM) - Abdominal pain during pregnancy in second trimester   THERAPY DIAG:  Pelvic pain affecting pregnancy, antepartum  Difficulty in walking, not elsewhere classified  Muscle weakness (generalized)  Cramp and spasm  Rationale for Evaluation and Treatment: Rehabilitation  ONSET DATE: 11/202024  SUBJECTIVE:                                                                                                                                                                                           SUBJECTIVE STATEMENT: Patient   Pt is high risk pregnancy dt rh factor, Baby is a girl (EDD 11/27/2023) Fluid intake: Yes:  states that she is always thirsty    PAIN:  Are you having pain? Yes NPRS scale: 8/10 Pain location:  low back, sacrum, sharp pain into her butt  Pain type: sharp Pain description: sharp   Aggravating factors: standing, sitting Relieving factors: reclined sitting, sometimes moving  PRECAUTIONS: None  RED FLAGS: None   WEIGHT BEARING RESTRICTIONS: No  FALLS:  Has patient fallen in last 6 months? Yes. Number of falls 1  LIVING ENVIRONMENT: Lives with: lives with their family Lives in: House/apartment Stairs: Yes: Internal: 5 steps; not asked Has following equipment at home: None  OCCUPATION: to be asked  PLOF: Independent  PATIENT GOALS: to have less pain  PERTINENT HISTORY:  3 vaginal deliveries Sexual abuse: to be asked  BOWEL MOVEMENT:  Pain with bowel movement: No Type of bowel movement:Type (Bristol Stool Scale) 1 Fully empty rectum: Yes:   Leakage: No Pads: No Fiber supplement: No  URINATION: no issues  INTERCOURSE: NA- d/t pain - does not want to try    PREGNANCY: Vaginal deliveries 3 Tearing Yes:   C-section deliveries 0 Currently pregnant Yes: 22  PROLAPSE: None   OBJECTIVE:  Note: Objective measures were completed at Evaluation unless otherwise noted.   PATIENT SURVEYS:   Eval:  LEFS- 5%  COGNITION: Overall cognitive status: Within functional limits for tasks assessed     SENSATION: Light touch: Appears intact Proprioception: Appears intact  MUSCLE LENGTH: Hamstrings: Right 70 deg; Left 70 deg   LUMBAR SPECIAL TESTS:  Single leg stance test: Positive- unable to d/t weakness, has to hold on, hips dip     POSTURE: No Significant postural limitations, rounded shoulders, forward head, increased lumbar lordosis, and anterior pelvic tilt  PELVIC ALIGNMENT:seems even  LUMBARAROM/PROM:limited d/t pain and pregnancy, guarded  LOWER EXTREMITY ROM: guarding throughout hips  LOWER EXTREMITY MMT: 4/5 bilateral grossly  throughout   PALPATION:   General  low abdominal tone, pelvic compression and trial of Serola belt relieves symptoms Lumbar paraspinals gripping, more on right                External Perineal Exam to be assessed if needed                             Internal Pelvic Floor to be assessed if needed  Patient confirms identification and approves PT to assess internal pelvic floor and treatment No  PELVIC MMT:   MMT eval  Vaginal   Internal Anal Sphincter   External Anal Sphincter   Puborectalis   Diastasis Recti yes  (Blank rows = not tested)        TONE: To be assessed if needed  PROLAPSE: To be assessed if needed  TODAY'S TREATMENT:                                                                                                                               DATE: 08/24/2023 Nustep level 5 x6 min with PT present to discuss status Seated hip abduction with pelvic floor contraction with yellow loop 3x10 Seated lateral trunk flexion over blue peanut ball x10 bilat Seated on green pball:  4 way pelvic tilt, CW and CCW pelvic circles.  X20 each Quadruped cat/cow x20 Brandy [name] pose 2x20 sec Side lying clamshell and reverse clamshell x10 bilat Sidelying open book x10 bilat   DATE: 08/24/23   EVAL see below   PATIENT EDUCATION:  Education details: relevant anatomy, serola belt trial, HEP, heat/ ice, exercises Person educated: Patient Education method: Explanation  and Demonstration Education comprehension: verbalized understanding and needs further education  HOME EXERCISE PROGRAM: Access Code: [email or web address] URL: [email or web address] Date: 08/24/2023 Prepared by: Clydie Braun Darivs Lunden  Exercises - Seated Hip Abduction with Pelvic Floor Contraction and Resistance Loop  - 1 x daily - 7 x weekly - 3 sets - 10 reps - Standing Shoulder Horizontal Abduction with Resistance  - 1 x daily - 7 x weekly - 3 sets - 10 reps - Cat Cow  - 1 x daily - 7 x weekly - 3 sets - 10 reps -  Supported Child's Pose with Diaphragmatic Breathing with Pelvic Floor Relaxation  - 1 x daily - 7 x weekly - 3 sets - 10 reps - Pelvic Muscle Energy Technique With Flexion and Extension  - 1 x daily - 7 x weekly - 3 sets - 10 reps - Seated Lateral Trunk Stretch on Swiss Ball  - 1 x daily - 7 x weekly - 3 sets - 10 reps  ASSESSMENT:  CLINICAL IMPRESSION: Mayrani presents to skilled PT reporting that she has not been able to obtain a pelvic/SI belt yet secondary to finances.  She is looking on [employer] to try to find something.  Patient able to progress through session with minimal cuing for technique throughout.  Patient continues to be limited some by pain.  Patient reports that she has been doing her exercises at home, as able.  Patient continues to require skilled PT to progress towards goal related activities.  OBJECTIVE IMPAIRMENTS: decreased activity tolerance, decreased coordination, decreased endurance, difficulty walking, decreased ROM, decreased strength, dizziness, increased muscle spasms, and pain.   ACTIVITY LIMITATIONS: lifting, bending, sitting, standing, squatting, sleeping, locomotion level, and caring for others  PARTICIPATION LIMITATIONS: meal prep, cleaning, laundry, and interpersonal relationship  PERSONAL FACTORS: Social background and Time since onset of injury/illness/exacerbation are also affecting patient's functional outcome.   REHAB POTENTIAL: Good  CLINICAL DECISION MAKING: Stable/uncomplicated  EVALUATION COMPLEXITY: Low   GOALS: Goals reviewed with patient? Yes  SHORT TERM GOALS: Target date: 08/20/2023    Pt will be I with her initial HEP Baseline: Goal status: MET on 08/24/2023  2.  Pt will report reduced pelvic and back pain to 4/10 at most with standing for 30 mins Baseline:  Goal status: INITIAL  3.  Pt will be able to walk for 30 mins without increased pain Baseline:  Goal status: INITIAL   LONG TERM GOALS: Target date: 10/15/2023    Pt will  have reduced pain with walking for an hour to max 2/10 Baseline:  Goal status: INITIAL  2.  Pt will be able to sleep without increased pain at least 6 hrs Baseline:  Goal status: INITIAL  3.  Pt will be I with advanced HEP Baseline:  Goal status: INITIAL  4.  Pt will will able to bend, stoop and lift without increased pain Baseline:  Goal status: INITIAL  5.  Pt will report no pain with intercourse Baseline:  Goal status: INITIAL  PLAN:  PT FREQUENCY: 1-2x/week  PT DURATION: 12 weeks  PLANNED INTERVENTIONS: 97110-Therapeutic exercises, 97530- Therapeutic activity, 97112- Neuromuscular re-education, 97535- Self Care, 42706- Manual therapy, [phone]- Electrical stimulation (manual), Taping, Dry Needling, Joint mobilization, Joint manipulation, Spinal manipulation, Spinal mobilization, Moist heat, and Biofeedback  PLAN FOR NEXT SESSION: Assess and progress HEP as indicated, strengthening, flexibility, manual/dry needling as indicated    Reather Laurence, PT, DPT 08/24/23, 11:41 AM  [facility] [address] [place], [place] [address] Phone # [phone]  Fax [phone]

## 2023-08-26 ENCOUNTER — Ambulatory Visit: Payer: Medicaid Other | Admitting: Rehabilitative and Restorative Service Providers"

## 2023-08-30 ENCOUNTER — Ambulatory Visit: Payer: Medicaid Other | Admitting: *Deleted

## 2023-08-30 ENCOUNTER — Other Ambulatory Visit: Payer: Medicaid Other

## 2023-08-30 ENCOUNTER — Ambulatory Visit (INDEPENDENT_AMBULATORY_CARE_PROVIDER_SITE_OTHER): Payer: Medicaid Other | Admitting: Family Medicine

## 2023-08-30 ENCOUNTER — Other Ambulatory Visit (HOSPITAL_COMMUNITY)
Admission: RE | Admit: 2023-08-30 | Discharge: 2023-08-30 | Disposition: A | Discharge status: Home / Self Care | Payer: Medicaid Other | Source: Ambulatory Visit | Attending: Family Medicine | Admitting: Family Medicine

## 2023-08-30 ENCOUNTER — Ambulatory Visit: Payer: Medicaid Other | Attending: Maternal & Fetal Medicine

## 2023-08-30 ENCOUNTER — Encounter: Payer: Self-pay | Admitting: *Deleted

## 2023-08-30 ENCOUNTER — Other Ambulatory Visit: Payer: Self-pay

## 2023-08-30 VITALS — BP 110/65 | HR 85

## 2023-08-30 VITALS — BP 108/70 | HR 80 | Wt 157.8 lb

## 2023-08-30 DIAGNOSIS — O26892 Other specified pregnancy related conditions, second trimester: Secondary | ICD-10-CM

## 2023-08-30 DIAGNOSIS — O365920 Maternal care for other known or suspected poor fetal growth, second trimester, not applicable or unspecified: Secondary | ICD-10-CM

## 2023-08-30 DIAGNOSIS — N898 Other specified noninflammatory disorders of vagina: Secondary | ICD-10-CM | POA: Insufficient documentation

## 2023-08-30 DIAGNOSIS — O0990 Supervision of high risk pregnancy, unspecified, unspecified trimester: Secondary | ICD-10-CM

## 2023-08-30 DIAGNOSIS — O09292 Supervision of pregnancy with other poor reproductive or obstetric history, second trimester: Secondary | ICD-10-CM

## 2023-08-30 DIAGNOSIS — O365930 Maternal care for other known or suspected poor fetal growth, third trimester, not applicable or unspecified: Secondary | ICD-10-CM

## 2023-08-30 DIAGNOSIS — Z3A27 27 weeks gestation of pregnancy: Secondary | ICD-10-CM

## 2023-08-30 DIAGNOSIS — O09893 Supervision of other high risk pregnancies, third trimester: Secondary | ICD-10-CM

## 2023-08-30 DIAGNOSIS — O361930 Maternal care for other isoimmunization, third trimester, not applicable or unspecified: Secondary | ICD-10-CM

## 2023-08-30 DIAGNOSIS — O26899 Other specified pregnancy related conditions, unspecified trimester: Secondary | ICD-10-CM | POA: Diagnosis not present

## 2023-08-30 DIAGNOSIS — O360120 Maternal care for anti-D [Rh] antibodies, second trimester, not applicable or unspecified: Secondary | ICD-10-CM

## 2023-08-30 DIAGNOSIS — Z6791 Unspecified blood type, Rh negative: Secondary | ICD-10-CM | POA: Diagnosis not present

## 2023-08-30 DIAGNOSIS — K219 Gastro-esophageal reflux disease without esophagitis: Secondary | ICD-10-CM

## 2023-08-30 DIAGNOSIS — Z23 Encounter for immunization: Secondary | ICD-10-CM

## 2023-08-30 DIAGNOSIS — O26893 Other specified pregnancy related conditions, third trimester: Secondary | ICD-10-CM

## 2023-08-30 DIAGNOSIS — B009 Herpesviral infection, unspecified: Secondary | ICD-10-CM

## 2023-08-30 DIAGNOSIS — O09899 Supervision of other high risk pregnancies, unspecified trimester: Secondary | ICD-10-CM

## 2023-08-30 DIAGNOSIS — O361920 Maternal care for other isoimmunization, second trimester, not applicable or unspecified: Secondary | ICD-10-CM

## 2023-08-30 DIAGNOSIS — O09212 Supervision of pregnancy with history of pre-term labor, second trimester: Secondary | ICD-10-CM

## 2023-08-30 DIAGNOSIS — O0993 Supervision of high risk pregnancy, unspecified, third trimester: Secondary | ICD-10-CM

## 2023-08-30 MED ORDER — RHO D IMMUNE GLOBULIN 1500 UNIT/2ML IJ SOSY
300.0000 ug | PREFILLED_SYRINGE | Freq: Once | INTRAMUSCULAR | Status: AC
  Administered 2023-08-30: 300 ug via INTRAMUSCULAR

## 2023-08-30 MED ORDER — PANTOPRAZOLE SODIUM 20 MG PO TBEC: 20.0000 mg | DELAYED_RELEASE_TABLET | Freq: Every day | ORAL | 3 refills | Status: DC

## 2023-08-30 NOTE — Progress Notes (Signed)
PRENATAL VISIT NOTE  Subjective:  Brandy [name] is a 28 y.o. [id number] at [redacted]w[redacted]d being seen today for ongoing prenatal care.  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  Patient reports contractions since last week .  Contractions: Irritability. Vag. Bleeding: None.  Movement: Present. Denies leaking of fluid.   The following portions of the patient's history were reviewed and updated as appropriate: allergies, current medications, past family history, past medical history, past social history, past surgical history and problem list.   Objective:   Vitals:   08/30/23 0819  BP: 108/70  Pulse: 80  Weight: 157 lb 12.8 oz (71.6 kg)    Fetal Status: Fetal Heart Rate (bpm): 120 Fundal Height: 26 cm Movement: Present     General:  Alert, oriented and cooperative. Patient is in no acute distress.  Skin: Skin is warm and dry. No rash noted.   Cardiovascular: Normal heart rate noted  Respiratory: Normal respiratory effort, no problems with respiration noted  Abdomen: Soft, gravid, appropriate for gestational age.  Pain/Pressure: Absent     Pelvic: Cervical exam performed in the presence of a chaperone Dilation: Closed Effacement (%): Thick Station: Ballotable  Extremities: Normal range of motion.  Edema: None  Mental Status: Normal mood and affect. Normal behavior. Normal judgment and thought content.   Assessment and Plan:  Pregnancy: [id number] at [redacted]w[redacted]d 1. Vaginal itching (Primary) Check wet prep and treat appropriately - Cervicovaginal ancillary only( [place])  2. Supervision of high risk pregnancy, antepartum Continue routine prenatal care. 28 week labs today - Tdap  vaccine greater than or equal to 7yo IM  3. Rh negative state in antepartum period Per last note for Rhogam, I thought we didn't give Rhogam if already isoimmunization - rho (d) immune globulin (RHIG/RHOPHYLAC) injection 300 mcg  4. HSV-2 (herpes simplex virus 2) infection Will need ppx 36 weeks  5. History of preterm delivery, currently pregnant Usually at 36 weeks, no evidence of PTL today Maternity belt given  6. Gastroesophageal reflux disease without esophagitis Can't take H2 blocker, start PPI - pantoprazole (PROTONIX) 20 MG tablet; Take 1 tablet (20 mg total) by mouth daily.  Dispense: 30 tablet; Refill: 3  7. [redacted] weeks gestation of pregnancy   8. Maternal red cell alloimmunization in second trimester, single or unspecified fetus Getting Dopplers of MCA with MFM  Preterm labor symptoms and general obstetric precautions including but not limited to vaginal bleeding, contractions, leaking of fluid and fetal movement were reviewed in detail with the patient. Please refer to After Visit Summary for other counseling recommendations.   Return in 2 weeks (on 09/13/2023).  Future Appointments  Date Time Provider Department Center  08/30/2023  3:15 PM [facility] NURSE [facility]  08/30/2023  3:30 PM [facility] US2 [facility]  08/31/2023 11:00 AM Menke, Shaunda, PT [facility] None  09/02/2023 11:00 AM Menke, Shaunda, PT [facility] None  09/06/2023  2:00 PM [facility] NURSE [facility]  09/06/2023  3:30 PM [facility] US5 [facility]  09/07/2023 11:00 AM Menke, Shaunda, PT [facility] None  09/09/2023 11:00 AM Menke, Shaunda, PT [facility] None  09/13/2023  7:15 AM [facility] NURSE [facility]  09/13/2023  7:30 AM [facility] US5 [facility]  09/14/2023 11:00 AM Menke, Shaunda, PT [facility] None  09/16/2023 11:00 AM Menke, Shaunda, PT [facility] None  09/20/2023  2:15 PM [facility] NURSE [facility]  09/20/2023  2:30 PM [facility] US5 [facility]  09/21/2023 11:45 AM Helmus, Jitka, PT [facility] None  09/23/2023  2:00  PM Helmus, Jitka, PT [facility] None  09/28/2023 11:45 AM Helmus, Jitka, PT [facility] None  09/30/2023  2:00 PM Helmus, Jitka, PT [facility] None    Reva Bores, MD

## 2023-08-30 NOTE — Patient Instructions (Signed)
[facility] [address], [place], [place] [address] [phone] Hours: Sunday Closed Monday 9AM-6PM Tuesday 9AM-6PM Wednesday 9AM-6PM Thursday 9AM-6PM Friday           9AM-6PM Saturday         10 AM-1PM

## 2023-08-31 ENCOUNTER — Encounter: Payer: Self-pay | Admitting: Family Medicine

## 2023-08-31 ENCOUNTER — Ambulatory Visit: Payer: Medicaid Other | Admitting: Rehabilitative and Restorative Service Providers"

## 2023-08-31 LAB — CERVICOVAGINAL ANCILLARY ONLY
Bacterial Vaginitis (gardnerella): POSITIVE — AB
Candida Glabrata: NEGATIVE
Candida Vaginitis: NEGATIVE
Chlamydia: NEGATIVE
Comment: NEGATIVE
Comment: NEGATIVE
Comment: NEGATIVE
Comment: NEGATIVE
Comment: NEGATIVE
Comment: NORMAL
Neisseria Gonorrhea: NEGATIVE
Trichomonas: NEGATIVE

## 2023-08-31 MED ORDER — CLINDAMYCIN PHOSPHATE 2 % VA CREA: 1.0000 | TOPICAL_CREAM | Freq: Every day | VAGINAL | 0 refills | Status: DC

## 2023-08-31 NOTE — Addendum Note (Signed)
Addended by: Reva Bores on: 08/31/2023 01:21 PM   Modules accepted: Orders

## 2023-09-02 ENCOUNTER — Ambulatory Visit: Payer: Medicaid Other | Admitting: Rehabilitative and Restorative Service Providers"

## 2023-09-06 ENCOUNTER — Ambulatory Visit: Payer: Medicaid Other | Admitting: *Deleted

## 2023-09-06 ENCOUNTER — Ambulatory Visit: Payer: Medicaid Other

## 2023-09-06 ENCOUNTER — Ambulatory Visit: Payer: Medicaid Other | Attending: Maternal & Fetal Medicine

## 2023-09-06 ENCOUNTER — Other Ambulatory Visit: Payer: Self-pay

## 2023-09-06 ENCOUNTER — Encounter: Payer: Self-pay | Admitting: *Deleted

## 2023-09-06 VITALS — BP 112/65 | HR 75

## 2023-09-06 DIAGNOSIS — O09213 Supervision of pregnancy with history of pre-term labor, third trimester: Secondary | ICD-10-CM

## 2023-09-06 DIAGNOSIS — O360120 Maternal care for anti-D [Rh] antibodies, second trimester, not applicable or unspecified: Secondary | ICD-10-CM | POA: Diagnosis present

## 2023-09-06 DIAGNOSIS — O360130 Maternal care for anti-D [Rh] antibodies, third trimester, not applicable or unspecified: Secondary | ICD-10-CM | POA: Diagnosis not present

## 2023-09-06 DIAGNOSIS — O09293 Supervision of pregnancy with other poor reproductive or obstetric history, third trimester: Secondary | ICD-10-CM | POA: Diagnosis not present

## 2023-09-06 DIAGNOSIS — O0990 Supervision of high risk pregnancy, unspecified, unspecified trimester: Secondary | ICD-10-CM

## 2023-09-06 DIAGNOSIS — Z3A28 28 weeks gestation of pregnancy: Secondary | ICD-10-CM

## 2023-09-07 ENCOUNTER — Encounter: Payer: Self-pay | Admitting: Rehabilitative and Restorative Service Providers"

## 2023-09-07 ENCOUNTER — Ambulatory Visit
Payer: Medicaid Other | Attending: Obstetrics and Gynecology | Admitting: Rehabilitative and Restorative Service Providers"

## 2023-09-07 DIAGNOSIS — R252 Cramp and spasm: Secondary | ICD-10-CM | POA: Diagnosis present

## 2023-09-07 DIAGNOSIS — R262 Difficulty in walking, not elsewhere classified: Secondary | ICD-10-CM | POA: Insufficient documentation

## 2023-09-07 DIAGNOSIS — M6281 Muscle weakness (generalized): Secondary | ICD-10-CM | POA: Diagnosis present

## 2023-09-07 DIAGNOSIS — R102 Pelvic and perineal pain: Secondary | ICD-10-CM | POA: Insufficient documentation

## 2023-09-07 DIAGNOSIS — O26899 Other specified pregnancy related conditions, unspecified trimester: Secondary | ICD-10-CM | POA: Insufficient documentation

## 2023-09-07 NOTE — Therapy (Addendum)
 OUTPATIENT PHYSICAL THERAPY TREATMENT NOTE   Patient Name: Brandy [name] MRN: [id number] DOB:1995-08-30, 28 y.o., female Today's Date: 09/07/2023  END OF SESSION:  PT End of Session - 09/07/23 1102     Visit Number 4    Date for PT Re-Evaluation 10/01/23    Authorization Type UHC Medicaid    PT Start Time 1100    PT Stop Time 1140    PT Time Calculation (min) 40 min    Activity Tolerance Patient tolerated treatment well    Behavior During Therapy WFL for tasks assessed/performed             Past Medical History:  Diagnosis Date   Chlamydia    Heart murmur    Diagnosed as a child   Migraine    Past Surgical History:  Procedure Laterality Date   NO PAST SURGERIES     Patient Active Problem List   Diagnosis Date Noted   History of preterm delivery, currently pregnant 07/12/2023   Supervision of high risk pregnancy, antepartum 04/28/2023   Rh negative state in antepartum period 04/25/2015   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) 04/25/2015   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    PCP[facility]   REFERRING PROVIDER: Zina Jerilynn LABOR, MD Ref Provider  REFERRING DIAG: [phone] (ICD-10-CM) - Abdominal pain during pregnancy in second trimester   THERAPY DIAG:  Pelvic pain affecting pregnancy, antepartum  Difficulty in walking, not elsewhere classified  Muscle weakness (generalized)  Cramp and spasm  Rationale for Evaluation and Treatment: Rehabilitation  ONSET DATE: 11/202024  SUBJECTIVE:                                                                                                                                                                                           SUBJECTIVE STATEMENT: Patient states that her pain is mild today  Pt is high risk pregnancy dt rh factor, Baby is a girl (EDD 11/27/2023) Fluid intake: Yes: states that she is always thirsty    PAIN:   Are you having pain? Yes NPRS scale: 5-6/10 Pain location:  low back, sacrum, sharp pain into her butt  Pain type: sharp Pain description: sharp   Aggravating factors: standing, sitting Relieving factors: reclined sitting, sometimes moving  PRECAUTIONS: None  RED FLAGS: None   WEIGHT BEARING RESTRICTIONS: No  FALLS:  Has patient fallen in last 6 months? Yes. Number of falls 1  LIVING ENVIRONMENT: Lives with: lives with their family Lives in: House/apartment Stairs: Yes: Internal: 5 steps; not asked Has following equipment at home: None  OCCUPATION: to be asked  PLOF: Independent  PATIENT GOALS: to have less pain  PERTINENT HISTORY:  3 vaginal deliveries Sexual abuse: to be asked  BOWEL MOVEMENT:  Pain with bowel movement: No Type of bowel movement:Type (Bristol Stool Scale) 1 Fully empty rectum: Yes:   Leakage: No Pads: No Fiber supplement: No  URINATION: no issues  INTERCOURSE: NA- d/t pain - does not want to try    PREGNANCY: Vaginal deliveries 3 Tearing Yes:   C-section deliveries 0 Currently pregnant Yes: 22  PROLAPSE: None   OBJECTIVE:  Note: Objective measures were completed at Evaluation unless otherwise noted.   PATIENT SURVEYS:   Eval:  LEFS- 5%  COGNITION: Overall cognitive status: Within functional limits for tasks assessed     SENSATION: Light touch: Appears intact Proprioception: Appears intact  MUSCLE LENGTH: Hamstrings: Right 70 deg; Left 70 deg   LUMBAR SPECIAL TESTS:  Single leg stance test: Positive- unable to d/t weakness, has to hold on, hips dip     POSTURE: No Significant postural limitations, rounded shoulders, forward head, increased lumbar lordosis, and anterior pelvic tilt  PELVIC ALIGNMENT:seems even  LUMBARAROM/PROM:limited d/t pain and pregnancy, guarded  LOWER EXTREMITY ROM: guarding throughout hips  LOWER EXTREMITY MMT: 4/5 bilateral grossly throughout   PALPATION:   General  low  abdominal tone, pelvic compression and trial of Serola belt relieves symptoms Lumbar paraspinals gripping, more on right                External Perineal Exam to be assessed if needed                             Internal Pelvic Floor to be assessed if needed  Patient confirms identification and approves PT to assess internal pelvic floor and treatment No  PELVIC MMT:   MMT eval  Vaginal   Internal Anal Sphincter   External Anal Sphincter   Puborectalis   Diastasis Recti yes  (Blank rows = not tested)        TONE: To be assessed if needed  PROLAPSE: To be assessed if needed  TODAY'S TREATMENT:                                                                                                                               DATE: 09/07/2023 Nustep level 4 x6 min with PT present to discuss status Seated hip abduction with pelvic floor contraction with yellow loop 3x10 Seated lateral trunk flexion over blue peanut ball x10 bilat Seated on green pball:  4 way pelvic tilt, CW and CCW pelvic circles.  X20 each Standing leaning over green pball (like a supported L stretch) performing side to side sway x20 Quadruped cat/cow x20 Childs pose 2x20 sec Side lying clamshell and reverse clamshell 2x10 each bilat Sidelying open book x10 bilat Supine transversus abdominus activation with marching 2x10 Seated shoulder ER and horizontal abduction with green tband x10 each Seated  hamstring stretch x20 sec bilat   DATE: 08/24/2023 Nustep level 5 x6 min with PT present to discuss status Seated hip abduction with pelvic floor contraction with yellow loop 3x10 Seated lateral trunk flexion over blue peanut ball x10 bilat Seated on green pball:  4 way pelvic tilt, CW and CCW pelvic circles.  X20 each Quadruped cat/cow x20 Childs pose 2x20 sec Side lying clamshell and reverse clamshell x10 bilat Sidelying open book x10 bilat    PATIENT EDUCATION:  Education details: relevant anatomy, serola  belt trial, HEP, heat/ ice, exercises Person educated: Patient Education method: Explanation and Demonstration Education comprehension: verbalized understanding and needs further education  HOME EXERCISE PROGRAM: Access Code: [email or web address] URL: [email or web address] Date: 08/24/2023 Prepared by: Jarrell Kienan Doublin  Exercises - Seated Hip Abduction with Pelvic Floor Contraction and Resistance Loop  - 1 x daily - 7 x weekly - 3 sets - 10 reps - Standing Shoulder Horizontal Abduction with Resistance  - 1 x daily - 7 x weekly - 3 sets - 10 reps - Cat Cow  - 1 x daily - 7 x weekly - 3 sets - 10 reps - Supported Child's Pose with Diaphragmatic Breathing with Pelvic Floor Relaxation  - 1 x daily - 7 x weekly - 3 sets - 10 reps - Pelvic Muscle Energy Technique With Flexion and Extension  - 1 x daily - 7 x weekly - 3 sets - 10 reps - Seated Lateral Trunk Stretch on Swiss Ball  - 1 x daily - 7 x weekly - 3 sets - 10 reps  ASSESSMENT:  CLINICAL IMPRESSION: Brandy [name] presents to skilled PT mild pain.  She states that she has been doing her exercises.  Patient able to progress through session with minimal cuing for improved technique and pacing.  Patient with muscle fatigue with various exercises and requires some seated recovery periods throughout.  Patient did report feeling looser after hamstring stretch.  Patient continues to require skilled PT to progress towards goal related activities.  OBJECTIVE IMPAIRMENTS: decreased activity tolerance, decreased coordination, decreased endurance, difficulty walking, decreased ROM, decreased strength, dizziness, increased muscle spasms, and pain.   ACTIVITY LIMITATIONS: lifting, bending, sitting, standing, squatting, sleeping, locomotion level, and caring for others  PARTICIPATION LIMITATIONS: meal prep, cleaning, laundry, and interpersonal relationship  PERSONAL FACTORS: Social background and Time since onset of injury/illness/exacerbation are also  affecting patient's functional outcome.   REHAB POTENTIAL: Good  CLINICAL DECISION MAKING: Stable/uncomplicated  EVALUATION COMPLEXITY: Low   GOALS: Goals reviewed with patient? Yes  SHORT TERM GOALS: Target date: 08/20/2023    Pt will be I with her initial HEP Baseline: Goal status: MET on 08/24/2023  2.  Pt will report reduced pelvic and back pain to 4/10 at most with standing for 30 mins Baseline:  Goal status: Ongoing  3.  Pt will be able to walk for 30 mins without increased pain Baseline:  Goal status: Ongoing   LONG TERM GOALS: Target date: 10/15/2023    Pt will have reduced pain with walking for an hour to max 2/10 Baseline:  Goal status: INITIAL  2.  Pt will be able to sleep without increased pain at least 6 hrs Baseline:  Goal status: INITIAL  3.  Pt will be I with advanced HEP Baseline:  Goal status: INITIAL  4.  Pt will will able to bend, stoop and lift without increased pain Baseline:  Goal status: INITIAL  5.  Pt will report no pain with intercourse Baseline:  Goal status: INITIAL  PLAN:  PT FREQUENCY: 1-2x/week  PT DURATION: 12 weeks  PLANNED INTERVENTIONS: 97110-Therapeutic exercises, 97530- Therapeutic activity, 97112- Neuromuscular re-education, 97535- Self Care, [id number]- Manual therapy, [phone]- Electrical stimulation (manual), Taping, Dry Needling, Joint mobilization, Joint manipulation, Spinal manipulation, Spinal mobilization, Moist heat, and Biofeedback  PLAN FOR NEXT SESSION: Assess and progress HEP as indicated, strengthening, flexibility, manual/dry needling as indicated    Jarrell Laming, PT, DPT 09/07/23, 1:27 PM  [facility] [address], [address] [place], [place] [address] Phone # [phone] Fax [phone]  PHYSICAL THERAPY DISCHARGE SUMMARY   Patient agrees to discharge. Patient goals were partially met. Patient is being discharged due to a change in medical status.  Jitka Helmus,  PT 11/11/23 9:19 AM

## 2023-09-09 ENCOUNTER — Encounter: Payer: Medicaid Other | Admitting: Rehabilitative and Restorative Service Providers"

## 2023-09-10 ENCOUNTER — Encounter: Payer: Self-pay | Admitting: Obstetrics & Gynecology

## 2023-09-10 LAB — CBC
Hematocrit: 37.2 % (ref 34.0–46.6)
Hemoglobin: 12.2 g/dL (ref 11.1–15.9)
MCH: 34.3 pg — ABNORMAL HIGH (ref 26.6–33.0)
MCHC: 32.8 g/dL (ref 31.5–35.7)
MCV: 105 fL — ABNORMAL HIGH (ref 79–97)
Platelets: 250 10*3/uL (ref 150–450)
RBC: 3.56 x10E6/uL — ABNORMAL LOW (ref 3.77–5.28)
RDW: 11.9 % (ref 11.7–15.4)
WBC: 5.6 10*3/uL (ref 3.4–10.8)

## 2023-09-10 LAB — AB SCR+ANTIBODY ID: Antibody Screen: POSITIVE — AB

## 2023-09-10 LAB — RPR: RPR Ser Ql: NONREACTIVE

## 2023-09-10 LAB — GLUCOSE TOLERANCE, 2 HOURS W/ 1HR
Glucose, 1 hour: 101 mg/dL (ref 70–179)
Glucose, 2 hour: 105 mg/dL (ref 70–152)
Glucose, Fasting: 86 mg/dL (ref 70–91)

## 2023-09-10 LAB — HIV ANTIBODY (ROUTINE TESTING W REFLEX): HIV Screen 4th Generation wRfx: NONREACTIVE

## 2023-09-10 LAB — ANTIBODY SCREEN

## 2023-09-13 ENCOUNTER — Ambulatory Visit: Payer: Medicaid Other

## 2023-09-13 ENCOUNTER — Other Ambulatory Visit: Payer: Self-pay

## 2023-09-13 ENCOUNTER — Ambulatory Visit: Payer: Medicaid Other | Attending: Maternal & Fetal Medicine

## 2023-09-13 ENCOUNTER — Other Ambulatory Visit: Payer: Self-pay | Admitting: *Deleted

## 2023-09-13 VITALS — BP 116/66

## 2023-09-13 DIAGNOSIS — O360130 Maternal care for anti-D [Rh] antibodies, third trimester, not applicable or unspecified: Secondary | ICD-10-CM

## 2023-09-13 DIAGNOSIS — O360120 Maternal care for anti-D [Rh] antibodies, second trimester, not applicable or unspecified: Secondary | ICD-10-CM | POA: Diagnosis present

## 2023-09-13 DIAGNOSIS — O0990 Supervision of high risk pregnancy, unspecified, unspecified trimester: Secondary | ICD-10-CM | POA: Diagnosis present

## 2023-09-13 DIAGNOSIS — Z3A29 29 weeks gestation of pregnancy: Secondary | ICD-10-CM

## 2023-09-13 DIAGNOSIS — O09293 Supervision of pregnancy with other poor reproductive or obstetric history, third trimester: Secondary | ICD-10-CM

## 2023-09-13 DIAGNOSIS — O09213 Supervision of pregnancy with history of pre-term labor, third trimester: Secondary | ICD-10-CM

## 2023-09-14 ENCOUNTER — Ambulatory Visit: Payer: Medicaid Other | Admitting: Rehabilitative and Restorative Service Providers"

## 2023-09-15 ENCOUNTER — Ambulatory Visit (INDEPENDENT_AMBULATORY_CARE_PROVIDER_SITE_OTHER): Payer: Medicaid Other | Admitting: Obstetrics and Gynecology

## 2023-09-15 ENCOUNTER — Other Ambulatory Visit: Payer: Self-pay

## 2023-09-15 VITALS — BP 115/70 | HR 79 | Wt 162.9 lb

## 2023-09-15 DIAGNOSIS — R718 Other abnormality of red blood cells: Secondary | ICD-10-CM | POA: Diagnosis not present

## 2023-09-15 DIAGNOSIS — B009 Herpesviral infection, unspecified: Secondary | ICD-10-CM | POA: Diagnosis not present

## 2023-09-15 DIAGNOSIS — Z3A29 29 weeks gestation of pregnancy: Secondary | ICD-10-CM | POA: Diagnosis not present

## 2023-09-15 DIAGNOSIS — O09899 Supervision of other high risk pregnancies, unspecified trimester: Secondary | ICD-10-CM

## 2023-09-15 DIAGNOSIS — O0993 Supervision of high risk pregnancy, unspecified, third trimester: Secondary | ICD-10-CM

## 2023-09-15 DIAGNOSIS — O361930 Maternal care for other isoimmunization, third trimester, not applicable or unspecified: Secondary | ICD-10-CM

## 2023-09-15 DIAGNOSIS — O361920 Maternal care for other isoimmunization, second trimester, not applicable or unspecified: Secondary | ICD-10-CM

## 2023-09-15 DIAGNOSIS — O0990 Supervision of high risk pregnancy, unspecified, unspecified trimester: Secondary | ICD-10-CM

## 2023-09-15 DIAGNOSIS — L299 Pruritus, unspecified: Secondary | ICD-10-CM

## 2023-09-15 DIAGNOSIS — O09893 Supervision of other high risk pregnancies, third trimester: Secondary | ICD-10-CM | POA: Diagnosis not present

## 2023-09-15 MED ORDER — DIPHENHYDRAMINE-ZINC ACETATE 2-0.1 % EX CREA: 1.0000 | TOPICAL_CREAM | Freq: Two times a day (BID) | CUTANEOUS | Status: DC | PRN

## 2023-09-15 NOTE — Progress Notes (Signed)
PRENATAL VISIT NOTE  Subjective:  Brandy [name] is a 28 y.o. [id number] at [redacted]w[redacted]d being seen today for ongoing prenatal care.  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  Patient reports  facial/chin/neck itching for the past week with bumps .  No new pets, clothes, furniture, detergents, staying some place new. Contractions: Not present. Vag. Bleeding: None.  Movement: Present. Denies leaking of fluid.   The following portions of the patient's history were reviewed and updated as appropriate: allergies, current medications, past family history, past medical history, past social history, past surgical history and problem list.   Objective:   Vitals:   09/15/23 1521  BP: 115/70  Pulse: 79  Weight: 162 lb 14.4 oz (73.9 kg)    Fetal Status: Fetal Heart Rate (bpm): 133   Movement: Present     General:  Alert, oriented and cooperative. Patient is in no acute distress.  Skin: Skin is warm and dry. No rash noted.   Cardiovascular: Normal heart rate noted  Respiratory: Normal respiratory effort, no problems with respiration noted  Abdomen: Soft, gravid, appropriate for gestational age.  Pain/Pressure: Present     Pelvic: Cervical exam deferred        Extremities: Normal range of motion.  Edema: None  Mental Status: Normal mood and affect. Normal behavior. Normal judgment and thought content.  HEENT: on underside of neck are small bumps similar to pimples with otherwise normal skin  Assessment and Plan:  Pregnancy: [id number] at [redacted]w[redacted]d 1. [redacted] weeks gestation of pregnancy (Primary) Depo provera.  Followed by PT for pelvic pain - B12 and Folate Panel - VITAMIN D 25  Hydroxy (Vit-D Deficiency, Fractures)  2. HSV-2 (herpes simplex virus 2) infection Start ppx at 32-34wks given h/o PTB at 36wks  3. History of preterm delivery, currently pregnant  4. 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 Too weak to titer at 1/27 visit. D/w her that MFM will determine delivery timing 2/10: MCA and AFI wnl 1/21: 30%, 909gm, ac 30% - B12 and Folate Panel - VITAMIN D 25 Hydroxy (Vit-D Deficiency, Fractures)  5. Supervision of high risk pregnancy, antepartum  6. Elevated MCV - B12 and Folate Panel - VITAMIN D 25 Hydroxy (Vit-D Deficiency, Fractures)  7. Pruritus, unspecified Recommend benadryl cream and face wash formulated for dry and/or sensitive skin  Preterm labor symptoms and general obstetric precautions including but not limited to vaginal bleeding, contractions, leaking of fluid and fetal movement were reviewed in detail with the patient. Please refer to After Visit Summary for other counseling recommendations.   Return in 12 days (on 09/27/2023) for in person, high risk ob, md visit.  Future Appointments  Date Time Provider Department Center  09/16/2023 11:00 AM Belva, Cowan, PT [facility] None  09/20/2023  2:15 PM [facility] NURSE [facility]  09/20/2023  2:30 PM [facility] US5 [facility]  09/21/2023 11:45 AM Helmus, Jitka, PT [facility] None  09/23/2023  2:00 PM Helmus, Jitka, PT [facility] None  09/27/2023  7:30 AM [facility] US3 [facility]  09/28/2023 11:45 AM Helmus, Jitka, PT [facility] None  09/30/2023  2:00 PM Helmus, Jitka, PT [facility] None  10/04/2023  7:30 AM [facility] US3 [facility]  10/11/2023  3:30 PM [facility] US4 [facility]  10/18/2023  3:30 PM [facility] US3 [facility]    Presquille Bing, MD

## 2023-09-16 ENCOUNTER — Ambulatory Visit: Payer: Medicaid Other | Admitting: Rehabilitative and Restorative Service Providers"

## 2023-09-16 LAB — VITAMIN D 25 HYDROXY (VIT D DEFICIENCY, FRACTURES): Vit D, 25-Hydroxy: 15.8 ng/mL — ABNORMAL LOW (ref 30.0–100.0)

## 2023-09-16 LAB — B12 AND FOLATE PANEL
Folate: 11 ng/mL (ref >3.0)
Vitamin B-12: 202 pg/mL — ABNORMAL LOW (ref 232–1245)

## 2023-09-17 ENCOUNTER — Telehealth: Payer: Self-pay | Admitting: Lactation Services

## 2023-09-17 NOTE — Telephone Encounter (Signed)
Awaiting Determination.    Brandy [name] ([employer]) Rx #: [id number] Need Help? Call us at [phone] Status sent iconSent to Plan today Drug Clindamycin Phosphate 2% cream ePA cloud logo Form [employer] Medicaid Electronic Prior Authorization Form (2017 NCPDP) Original Claim Info [id number] PA Req, Dr submit ePA at [facility].comOr MD Call [phone], Possible 3 DSSUBMIT 03 Lvl of Service PA# 72 PA TYPE8*WMART*[id number]*Please visit [email or web address] for assistance resolvingthe most common patient rejections

## 2023-09-20 ENCOUNTER — Ambulatory Visit: Payer: Medicaid Other | Admitting: *Deleted

## 2023-09-20 ENCOUNTER — Other Ambulatory Visit: Payer: Self-pay

## 2023-09-20 ENCOUNTER — Encounter: Payer: Self-pay | Admitting: Obstetrics and Gynecology

## 2023-09-20 ENCOUNTER — Ambulatory Visit: Payer: Medicaid Other | Attending: Maternal & Fetal Medicine

## 2023-09-20 ENCOUNTER — Encounter: Payer: Self-pay | Admitting: *Deleted

## 2023-09-20 ENCOUNTER — Ambulatory Visit: Payer: Medicaid Other

## 2023-09-20 VITALS — BP 104/66 | HR 75

## 2023-09-20 DIAGNOSIS — O09213 Supervision of pregnancy with history of pre-term labor, third trimester: Secondary | ICD-10-CM | POA: Diagnosis not present

## 2023-09-20 DIAGNOSIS — O0990 Supervision of high risk pregnancy, unspecified, unspecified trimester: Secondary | ICD-10-CM | POA: Diagnosis present

## 2023-09-20 DIAGNOSIS — O360130 Maternal care for anti-D [Rh] antibodies, third trimester, not applicable or unspecified: Secondary | ICD-10-CM

## 2023-09-20 DIAGNOSIS — R7989 Other specified abnormal findings of blood chemistry: Secondary | ICD-10-CM | POA: Insufficient documentation

## 2023-09-20 DIAGNOSIS — Z3A30 30 weeks gestation of pregnancy: Secondary | ICD-10-CM

## 2023-09-20 DIAGNOSIS — O09293 Supervision of pregnancy with other poor reproductive or obstetric history, third trimester: Secondary | ICD-10-CM | POA: Diagnosis not present

## 2023-09-20 DIAGNOSIS — O280 Abnormal hematological finding on antenatal screening of mother: Secondary | ICD-10-CM | POA: Insufficient documentation

## 2023-09-20 DIAGNOSIS — O360120 Maternal care for anti-D [Rh] antibodies, second trimester, not applicable or unspecified: Secondary | ICD-10-CM | POA: Diagnosis present

## 2023-09-20 MED ORDER — VITAMIN D-3 125 MCG (5000 UT) PO TABS: 1.0000 | ORAL_TABLET | Freq: Every day | ORAL | 1 refills | Status: AC

## 2023-09-20 NOTE — Addendum Note (Signed)
Addended by: [place] Bing on: 09/20/2023 12:29 PM   Modules accepted: Orders

## 2023-09-20 NOTE — Telephone Encounter (Signed)
Alyne Will ([employer]) PA Case ID #: [id number] Rx #: [id number] Need Help? Call us at [phone] Outcome Denied on February 14 by [facility] 2017 NCPDP Request Reference Number: [id number]. CLINDAMYCIN CRE 2% VAG is denied for not meeting the prior authorization requirement(s). For further questions, call Community & State at [phone] for more information. Drug Clindamycin Phosphate 2% cream ePA cloud logo Form [employer] Medicaid Electronic Prior Authorization Form [phone] NCPDP) Original Claim Info [id number] PA Req, Dr submit ePA at [facility].comOr MD Call [phone], Possible 3 DSSUBMIT 03 Lvl of Service PA# 72 PA TYPE8*WMART*[id number]*Please visit [email or web address] for assistance resolvingthe most common patient rejections  Per Medicaid preferred drug list the following may be approved: Cleocin Vaginal Ovules, Clindesse Vaginal Cream or Nuvessa Vaginal gel. Please advise.

## 2023-09-21 ENCOUNTER — Encounter (HOSPITAL_COMMUNITY): Payer: Self-pay | Admitting: Obstetrics and Gynecology

## 2023-09-21 ENCOUNTER — Encounter: Payer: Medicaid Other | Admitting: Physical Therapy

## 2023-09-21 ENCOUNTER — Inpatient Hospital Stay (HOSPITAL_COMMUNITY)
Admission: AD | Admit: 2023-09-21 | Discharge: 2023-09-21 | Disposition: A | Discharge status: Home / Self Care | Payer: Medicaid Other | Attending: Obstetrics and Gynecology | Admitting: Obstetrics and Gynecology

## 2023-09-21 ENCOUNTER — Other Ambulatory Visit: Payer: Self-pay

## 2023-09-21 DIAGNOSIS — O4703 False labor before 37 completed weeks of gestation, third trimester: Secondary | ICD-10-CM | POA: Diagnosis present

## 2023-09-21 DIAGNOSIS — O23593 Infection of other part of genital tract in pregnancy, third trimester: Secondary | ICD-10-CM | POA: Insufficient documentation

## 2023-09-21 DIAGNOSIS — O26892 Other specified pregnancy related conditions, second trimester: Secondary | ICD-10-CM | POA: Diagnosis not present

## 2023-09-21 DIAGNOSIS — O98813 Other maternal infectious and parasitic diseases complicating pregnancy, third trimester: Secondary | ICD-10-CM | POA: Insufficient documentation

## 2023-09-21 DIAGNOSIS — B3731 Acute candidiasis of vulva and vagina: Secondary | ICD-10-CM | POA: Insufficient documentation

## 2023-09-21 DIAGNOSIS — Z3A30 30 weeks gestation of pregnancy: Secondary | ICD-10-CM | POA: Insufficient documentation

## 2023-09-21 DIAGNOSIS — R109 Unspecified abdominal pain: Secondary | ICD-10-CM | POA: Insufficient documentation

## 2023-09-21 DIAGNOSIS — O26899 Other specified pregnancy related conditions, unspecified trimester: Secondary | ICD-10-CM

## 2023-09-21 LAB — WET PREP, GENITAL
Clue Cells Wet Prep HPF POC: NONE SEEN
Sperm: NONE SEEN
Trich, Wet Prep: NONE SEEN
WBC, Wet Prep HPF POC: <10 (ref <10)

## 2023-09-21 LAB — URINALYSIS, ROUTINE W REFLEX MICROSCOPIC
Bilirubin Urine: NEGATIVE
Glucose, UA: NEGATIVE mg/dL
Hgb urine dipstick: NEGATIVE
Ketones, ur: 5 mg/dL — AB
Leukocytes,Ua: NEGATIVE
Nitrite: NEGATIVE
Protein, ur: NEGATIVE mg/dL
Specific Gravity, Urine: 1.02 (ref 1.005–1.030)
pH: 6 (ref 5.0–8.0)

## 2023-09-21 LAB — FETAL FIBRONECTIN: Fetal Fibronectin: NEGATIVE

## 2023-09-21 MED ORDER — FLUCONAZOLE 150 MG PO TABS: 150.0000 mg | ORAL_TABLET | Freq: Every day | ORAL | 0 refills | Status: DC

## 2023-09-21 MED ORDER — PROMETHAZINE HCL 25 MG PO TABS
25.0000 mg | ORAL_TABLET | Freq: Once | ORAL | Status: AC
  Administered 2023-09-21: 25 mg via ORAL
  Filled 2023-09-21: qty 1

## 2023-09-21 NOTE — MAU Note (Signed)
Brandy [name] is a 28 y.o. at [redacted]w[redacted]d here in MAU reporting: ongoing lower abdominal cramping and lower back pain for 3 days. States she has not been timing the abdominal cramping but it feels like contractions. Reports the lower back pain is constant across lower right and left side. The pain is worse with movement. States she has been sleeping all day but got up at 1400 to take her daughter to an appointment and the pain has been unbearable since 1400. Denies taking any Tylenol today. Denies any LOF or VB. Reports +FM.  Denies any vaginal discharge, odor, pain, or itching. Denies any urinary symptoms.     Onset of complaint: 3 days ago  Pain score: back 10   lower abdominal 10 Vitals:   09/21/23 1638  BP: 114/70  Pulse: 78  Resp: 17  Temp: 97.7 F (36.5 C)     FHT:135 Lab orders placed from triage:  UA

## 2023-09-21 NOTE — MAU Provider Note (Signed)
History     CSN: [id number]  Arrival date and time: 09/21/23 1622   Event Date/Time   First Provider Initiated Contact with Patient    Chief Complaint  Patient presents with   Contractions   Back Pain    HPI  Brandy [name] is a 28 y.o. [id number] at [redacted]w[redacted]d who presents to the MAU for cramping. Has hx round ligament pain and symphysis pubis pain and has tried Flexeril w/o relief of sxs. Not contractions. Most of her symptoms is in her suprapubic area. Denies VB, LOF, ctx. Normal FM. No urinary sxs.  Past Medical History:  Diagnosis Date   Chlamydia    Heart murmur    Diagnosed as a child   Migraine     Past Surgical History:  Procedure Laterality Date   NO PAST SURGERIES      Family History  Problem Relation Age of Onset   Asthma Mother    Cancer Neg Hx    Diabetes Neg Hx    Heart disease Neg Hx    Hypertension Neg Hx     Social History   Tobacco Use   Smoking status: Never   Smokeless tobacco: Never  Vaping Use   Vaping status: Never Used  Substance Use Topics   Alcohol use: Not Currently   Drug use: No    Allergies:  Allergies  Allergen Reactions   Kiwi Extract Swelling   Metronidazole And Related Itching   Penicillins Hives and Other (See Comments)    Has patient had a PCN reaction causing immediate rash, facial/tongue/throat swelling, SOB or lightheadedness with hypotension: Yes Has patient had a PCN reaction causing severe rash involving mucus membranes or skin necrosis: No Has patient had a PCN reaction that required hospitalization No Has patient had a PCN reaction occurring within the last 10 years: No If all of the above answers are "NO", then may proceed with Cephalosporin use.    Lobster [Shellfish Allergy] Rash   Reglan [Metoclopramide] Diarrhea    No medications prior to admission.    ROS reviewed and pertinent positives and negatives as documented in HPI.  Physical Exam   Blood pressure 101/70, pulse 76, temperature 97.7 F (36.5  C), temperature source Oral, resp. rate 17, height 5\' 4"  (1.626 m), weight 73.5 kg, last menstrual period 02/20/2023, SpO2 99%.  Physical Exam Exam conducted with a chaperone present.  Constitutional:      General: She is not in acute distress.    Appearance: Normal appearance. She is not ill-appearing.  HENT:     Head: Normocephalic and atraumatic.  Cardiovascular:     Rate and Rhythm: Normal rate.  Pulmonary:     Effort: Pulmonary effort is normal.     Breath sounds: Normal breath sounds.  Abdominal:     Palpations: Abdomen is soft.     Tenderness: There is no abdominal tenderness. There is no guarding.  Genitourinary:    Comments: Sterile speculum exam w ex os visually dilated to 0.5 to 1 cm, on cervical exam, was FT/thick Musculoskeletal:        General: Normal range of motion.  Skin:    General: Skin is warm and dry.     Findings: No rash.  Neurological:     General: No focal deficit present.     Mental Status: She is alert and oriented to person, place, and time.   EFM: 135/mod/+a/-d  MAU Course  Procedures  MDM 28 y.o. [id number] at [redacted]w[redacted]d presenting for abdominal discomfort in third  trimester. She does not appear to be uncomfortable, abdominal exam is benign. Swabs obtained and pos for yeast. fFN obtained given hx preterm delivery and was negative. Will tx with dose of Diflucan. Discussed return precautions w pt.  Assessment and Plan  Vulvovaginal candidiasis - Plan: Discharge patient - Rx for Diflucan sent  Abdominal cramping affecting pregnancy - Plan: Discharge patient - Cx FT, thick  fFN neg   [name], MD OB Fellow, Faculty Practice [facility], Center for [facility]  09/21/2023, 7:47 PM

## 2023-09-22 LAB — GC/CHLAMYDIA PROBE AMP (~~LOC~~) NOT AT ARMC
Chlamydia: NEGATIVE
Comment: NEGATIVE
Comment: NORMAL
Neisseria Gonorrhea: NEGATIVE

## 2023-09-23 ENCOUNTER — Inpatient Hospital Stay (HOSPITAL_COMMUNITY)
Admission: AD | Admit: 2023-09-23 | Discharge: 2023-09-24 | Disposition: A | Discharge status: Home / Self Care | Payer: Medicaid Other | Attending: Obstetrics and Gynecology | Admitting: Obstetrics and Gynecology

## 2023-09-23 ENCOUNTER — Ambulatory Visit: Payer: Medicaid Other

## 2023-09-23 DIAGNOSIS — R1030 Lower abdominal pain, unspecified: Secondary | ICD-10-CM | POA: Diagnosis present

## 2023-09-23 DIAGNOSIS — M79605 Pain in left leg: Secondary | ICD-10-CM | POA: Diagnosis present

## 2023-09-23 DIAGNOSIS — Z3493 Encounter for supervision of normal pregnancy, unspecified, third trimester: Secondary | ICD-10-CM

## 2023-09-23 DIAGNOSIS — Z3A30 30 weeks gestation of pregnancy: Secondary | ICD-10-CM | POA: Diagnosis not present

## 2023-09-23 DIAGNOSIS — B3731 Acute candidiasis of vulva and vagina: Secondary | ICD-10-CM | POA: Diagnosis not present

## 2023-09-23 DIAGNOSIS — O98813 Other maternal infectious and parasitic diseases complicating pregnancy, third trimester: Secondary | ICD-10-CM | POA: Insufficient documentation

## 2023-09-23 DIAGNOSIS — O09293 Supervision of pregnancy with other poor reproductive or obstetric history, third trimester: Secondary | ICD-10-CM | POA: Insufficient documentation

## 2023-09-23 DIAGNOSIS — O0990 Supervision of high risk pregnancy, unspecified, unspecified trimester: Secondary | ICD-10-CM

## 2023-09-23 DIAGNOSIS — Z3689 Encounter for other specified antenatal screening: Secondary | ICD-10-CM | POA: Insufficient documentation

## 2023-09-23 DIAGNOSIS — M545 Low back pain, unspecified: Secondary | ICD-10-CM | POA: Diagnosis present

## 2023-09-23 DIAGNOSIS — O26893 Other specified pregnancy related conditions, third trimester: Secondary | ICD-10-CM | POA: Diagnosis not present

## 2023-09-23 NOTE — MAU Provider Note (Signed)
 Chief Complaint:  Abdominal Pain   HPI   None     Brandy [name] is a 28 y.o. [id number] at [redacted]w[redacted]d who presents to maternity admissions reporting abdominal pain that radiates down her left leg. She has been seeing PT with some relief. She took 650mg  of tylenol at 2100 without relief. Endorses regular and vigorous fetal movement. Denies LOF or VB.   Of note, patient recently seen in MAU and diagnosed with a vaginal yeast infection She has not yet picked up the diflucan that was prescribed. She was worked up for preterm labor and had a negative FFN.  .   Pregnancy Course: RhD isoimmunized. PT for pubic symphysis pain.  Past Medical History:  Diagnosis Date   Chlamydia    Heart murmur    Diagnosed as a child   Migraine    OB History  Gravida Para Term Preterm AB Living  4 3 0 3  3  SAB IAB Ectopic Multiple Live Births     0 3    # Outcome Date GA Lbr Len/2nd Weight Sex Type Anes PTL Lv  4 Current           3 Preterm 11/22/16 [redacted]w[redacted]d 09:55 / 00:03 2515 g M Vag-Spont EPI  LIV  2 Preterm 07/04/15 [redacted]w[redacted]d 13:46 / 00:16 2760 g F Vag-Spont EPI  LIV  1 Preterm 12/27/13 [redacted]w[redacted]d  2608 g M Vag-Spont EPI N LIV     Birth Comments: System Generated. Please review and update pregnancy details.   Past Surgical History:  Procedure Laterality Date   NO PAST SURGERIES     Family History  Problem Relation Age of Onset   Asthma Mother    Cancer Neg Hx    Diabetes Neg Hx    Heart disease Neg Hx    Hypertension Neg Hx    Social History   Tobacco Use   Smoking status: Never   Smokeless tobacco: Never  Vaping Use   Vaping status: Never Used  Substance Use Topics   Alcohol use: Not Currently   Drug use: No   Allergies  Allergen Reactions   Kiwi Extract Swelling   Metronidazole And Related Itching   Penicillins Hives and Other (See Comments)    Has patient had a PCN reaction causing immediate rash, facial/tongue/throat swelling, SOB or lightheadedness with hypotension: Yes Has patient had a  PCN reaction causing severe rash involving mucus membranes or skin necrosis: No Has patient had a PCN reaction that required hospitalization No Has patient had a PCN reaction occurring within the last 10 years: No If all of the above answers are "NO", then may proceed with Cephalosporin use.    Lobster [Shellfish Allergy] Rash   Reglan [Metoclopramide] Diarrhea   Medications Prior to Admission  Medication Sig Dispense Refill Last Dose/Taking   acetaminophen (TYLENOL) 500 MG tablet Take 500 mg by mouth every 6 (six) hours as needed.      Blood Pressure Monitoring DEVI 1 each by Does not apply route once a week. (Patient not taking: Reported on 08/24/2023) 1 each 0    Cholecalciferol (VITAMIN D-3) 125 MCG (5000 UT) TABS Take 1 tablet by mouth daily. 60 tablet 1    fluconazole (DIFLUCAN) 150 MG tablet Take 1 tablet (150 mg total) by mouth daily. 1 tablet 0    ondansetron (ZOFRAN-ODT) 4 MG disintegrating tablet Take 1 tablet (4 mg total) by mouth every 8 (eight) hours as needed for nausea or vomiting. 15 tablet 1    pantoprazole (  PROTONIX) 20 MG tablet Take 1 tablet (20 mg total) by mouth daily. 30 tablet 3    polyethylene glycol (MIRALAX) 17 g packet Take 17 g by mouth daily. (Patient not taking: Reported on 09/15/2023) 14 each 0    Prenatal MV & Min w/FA-DHA (PRENATAL GUMMIES) 0.18-25 MG CHEW Chew by mouth.      scopolamine (TRANSDERM-SCOP) 1 MG/3DAYS Place 1 patch (1.5 mg total) onto the skin every 3 (three) days. (Patient not taking: Reported on 09/15/2023) 10 patch 4    [DISCONTINUED] cyclobenzaprine (FLEXERIL) 5 MG tablet Take 1 tablet (5 mg total) by mouth 3 (three) times daily as needed for muscle spasms. 30 tablet 0     I have reviewed patient's Past Medical Hx, Surgical Hx, Family Hx, Social Hx, medications and allergies.   ROS  Pertinent items noted in HPI and remainder of comprehensive ROS otherwise negative.   PHYSICAL EXAM  Patient Vitals for the past 24 hrs:  BP Temp Pulse Resp  SpO2 Height Weight  09/23/23 2359 120/71 -- 85 -- -- -- --  09/23/23 2341 114/67 -- -- -- -- -- --  09/23/23 2338 -- 98.1 F (36.7 C) 77 17 100 % 5\' 4"  (1.626 m) 75.3 kg    Physical Exam Vitals and nursing note reviewed.  Constitutional:      General: She is not in acute distress.    Appearance: She is well-developed and normal weight. She is not ill-appearing, toxic-appearing or diaphoretic.  HENT:     Head: Normocephalic and atraumatic.  Cardiovascular:     Rate and Rhythm: Normal rate and regular rhythm.  Pulmonary:     Effort: Pulmonary effort is normal.  Skin:    General: Skin is warm and dry.     Capillary Refill: Capillary refill takes less than 2 seconds.  Neurological:     General: No focal deficit present.     Mental Status: She is alert and oriented to person, place, and time.  Psychiatric:        Mood and Affect: Mood normal.        Behavior: Behavior normal.      Fetal Tracing: Baseline: 125 Variability: moderate Accelerations: 15x15 Decelerations: variable  Toco: irregular, UI   Labs: Results for orders placed or performed during the hospital encounter of 09/23/23 (from the past 24 hours)  Urinalysis, Routine w reflex microscopic -Urine, Clean Catch     Status: None   Collection Time: 09/24/23 12:12 AM  Result Value Ref Range   Color, Urine YELLOW YELLOW   APPearance CLEAR CLEAR   Specific Gravity, Urine 1.013 1.005 - 1.030   pH 6.0 5.0 - 8.0   Glucose, UA NEGATIVE NEGATIVE mg/dL   Hgb urine dipstick NEGATIVE NEGATIVE   Bilirubin Urine NEGATIVE NEGATIVE   Ketones, ur NEGATIVE NEGATIVE mg/dL   Protein, ur NEGATIVE NEGATIVE mg/dL   Nitrite NEGATIVE NEGATIVE   Leukocytes,Ua NEGATIVE NEGATIVE    Imaging:  No results found.  MDM & MAU COURSE  MDM: Reviewed previous visit. Given negative FFN, low suspicion for PTL. Will trial flexeril for pain and give dose of diflucan here.   MAU Course: Orders Placed This Encounter  Procedures   Urinalysis,  Routine w reflex microscopic -Urine, Clean Catch   Discharge patient   Meds ordered this encounter  Medications   fluconazole (DIFLUCAN) tablet 150 mg   cyclobenzaprine (FLEXERIL) tablet 10 mg   cyclobenzaprine (FLEXERIL) 5 MG tablet    Sig: Take 1 tablet (5 mg total) by  mouth 3 (three) times daily as needed for muscle spasms.    Dispense:  30 tablet    Refill:  0    Supervising Provider:   Reva Bores [[id number]]    ASSESSMENT   1. Supervision of high risk pregnancy, antepartum   2. Vaginal yeast infection   3. [redacted] weeks gestation of pregnancy   4. NST (non-stress test) reactive   5. Movement of fetus present during pregnancy in third trimester   Reviewed previous visit. Given negative FFN, low suspicion for PTL.  Will trial flexeril for pain and give dose of diflucan here. Flexeril relieved pain from a 10/10 to a 1/10.   PLAN  - Prescription for flexeril sent to preferred pharmacy. Diflucan from previous visit still available should patient need 2nd dose in 72 hours. Counseled. Discharge home in stable condition.  Return precautions advised.     Allergies as of 09/24/2023       Reactions   Kiwi Extract Swelling   Metronidazole And Related Itching   Penicillins Hives, Other (See Comments)   Has patient had a PCN reaction causing immediate rash, facial/tongue/throat swelling, SOB or lightheadedness with hypotension: Yes Has patient had a PCN reaction causing severe rash involving mucus membranes or skin necrosis: No Has patient had a PCN reaction that required hospitalization No Has patient had a PCN reaction occurring within the last 10 years: No If all of the above answers are "NO", then may proceed with Cephalosporin use.   Lobster [shellfish Allergy] Rash   Reglan [metoclopramide] Diarrhea        Medication List     TAKE these medications    acetaminophen 500 MG tablet Commonly known as: TYLENOL Take 500 mg by mouth every 6 (six) hours as needed.   Blood  Pressure Monitoring Devi 1 each by Does not apply route once a week.   cyclobenzaprine 5 MG tablet Commonly known as: FLEXERIL Take 1 tablet (5 mg total) by mouth 3 (three) times daily as needed for muscle spasms.   fluconazole 150 MG tablet Commonly known as: Diflucan Take 1 tablet (150 mg total) by mouth daily.   ondansetron 4 MG disintegrating tablet Commonly known as: ZOFRAN-ODT Take 1 tablet (4 mg total) by mouth every 8 (eight) hours as needed for nausea or vomiting.   pantoprazole 20 MG tablet Commonly known as: Protonix Take 1 tablet (20 mg total) by mouth daily.   polyethylene glycol 17 g packet Commonly known as: MiraLax Take 17 g by mouth daily.   Prenatal Gummies 0.18-25 MG Chew Chew by mouth.   Transderm-Scop 1 MG/3DAYS Generic drug: scopolamine Place 1 patch (1.5 mg total) onto the skin every 3 (three) days.   Vitamin D-3 125 MCG (5000 UT) Tabs Take 1 tablet by mouth daily.        Lamont Snowball, MSN, CNM 09/24/2023 1:55 AM  Certified Nurse Midwife, [facility]

## 2023-09-23 NOTE — MAU Note (Signed)
.  Brandy [name] is a 28 y.o. at [redacted]w[redacted]d here in MAU reporting lower abd pain and the pain radiates down her L leg. Some lower back pain as well. Pain is constant and sharp. Pain started this am about 1000. Took Tylenol 325x2 at 2100 and did not help. Reports good FM and denies LOF or VB  LMP: n/a Onset of complaint: 1000 Pain score: 10 Vitals:   09/23/23 2338 09/23/23 2341  BP:  114/67  Pulse: 77   Resp: 17   Temp: 98.1 F (36.7 C)   SpO2: 100%      FHT: 140  Lab orders placed from triage: u/a

## 2023-09-24 ENCOUNTER — Encounter (HOSPITAL_COMMUNITY): Payer: Self-pay | Admitting: Obstetrics and Gynecology

## 2023-09-24 DIAGNOSIS — B3731 Acute candidiasis of vulva and vagina: Secondary | ICD-10-CM

## 2023-09-24 DIAGNOSIS — Z3A30 30 weeks gestation of pregnancy: Secondary | ICD-10-CM | POA: Diagnosis not present

## 2023-09-24 DIAGNOSIS — O26893 Other specified pregnancy related conditions, third trimester: Secondary | ICD-10-CM | POA: Diagnosis not present

## 2023-09-24 LAB — URINALYSIS, ROUTINE W REFLEX MICROSCOPIC
Bilirubin Urine: NEGATIVE
Glucose, UA: NEGATIVE mg/dL
Hgb urine dipstick: NEGATIVE
Ketones, ur: NEGATIVE mg/dL
Leukocytes,Ua: NEGATIVE
Nitrite: NEGATIVE
Protein, ur: NEGATIVE mg/dL
Specific Gravity, Urine: 1.013 (ref 1.005–1.030)
pH: 6 (ref 5.0–8.0)

## 2023-09-24 MED ORDER — CYCLOBENZAPRINE HCL 5 MG PO TABS: 5.0000 mg | ORAL_TABLET | Freq: Three times a day (TID) | ORAL | 0 refills | Status: DC | PRN

## 2023-09-24 MED ORDER — CYCLOBENZAPRINE HCL 5 MG PO TABS
10.0000 mg | ORAL_TABLET | Freq: Once | ORAL | Status: AC
  Administered 2023-09-24: 10 mg via ORAL
  Filled 2023-09-24: qty 2

## 2023-09-24 MED ORDER — FLUCONAZOLE 150 MG PO TABS
150.0000 mg | ORAL_TABLET | Freq: Once | ORAL | Status: AC
  Administered 2023-09-24: 150 mg via ORAL
  Filled 2023-09-24: qty 1

## 2023-09-27 ENCOUNTER — Other Ambulatory Visit: Payer: Self-pay

## 2023-09-27 ENCOUNTER — Ambulatory Visit: Payer: Medicaid Other | Attending: Obstetrics and Gynecology

## 2023-09-27 ENCOUNTER — Ambulatory Visit: Payer: Medicaid Other | Admitting: Obstetrics and Gynecology

## 2023-09-27 VITALS — BP 107/70 | HR 75 | Wt 164.0 lb

## 2023-09-27 DIAGNOSIS — O26893 Other specified pregnancy related conditions, third trimester: Secondary | ICD-10-CM | POA: Diagnosis not present

## 2023-09-27 DIAGNOSIS — O09293 Supervision of pregnancy with other poor reproductive or obstetric history, third trimester: Secondary | ICD-10-CM

## 2023-09-27 DIAGNOSIS — Z3A31 31 weeks gestation of pregnancy: Secondary | ICD-10-CM

## 2023-09-27 DIAGNOSIS — O09899 Supervision of other high risk pregnancies, unspecified trimester: Secondary | ICD-10-CM

## 2023-09-27 DIAGNOSIS — O360130 Maternal care for anti-D [Rh] antibodies, third trimester, not applicable or unspecified: Secondary | ICD-10-CM

## 2023-09-27 DIAGNOSIS — O361930 Maternal care for other isoimmunization, third trimester, not applicable or unspecified: Secondary | ICD-10-CM | POA: Diagnosis not present

## 2023-09-27 DIAGNOSIS — O361920 Maternal care for other isoimmunization, second trimester, not applicable or unspecified: Secondary | ICD-10-CM

## 2023-09-27 DIAGNOSIS — Z6791 Unspecified blood type, Rh negative: Secondary | ICD-10-CM

## 2023-09-27 DIAGNOSIS — B009 Herpesviral infection, unspecified: Secondary | ICD-10-CM

## 2023-09-27 DIAGNOSIS — O09893 Supervision of other high risk pregnancies, third trimester: Secondary | ICD-10-CM | POA: Diagnosis not present

## 2023-09-27 DIAGNOSIS — O26899 Other specified pregnancy related conditions, unspecified trimester: Secondary | ICD-10-CM

## 2023-09-27 DIAGNOSIS — O0993 Supervision of high risk pregnancy, unspecified, third trimester: Secondary | ICD-10-CM

## 2023-09-27 DIAGNOSIS — O09213 Supervision of pregnancy with history of pre-term labor, third trimester: Secondary | ICD-10-CM | POA: Diagnosis not present

## 2023-09-27 DIAGNOSIS — O0990 Supervision of high risk pregnancy, unspecified, unspecified trimester: Secondary | ICD-10-CM

## 2023-09-27 NOTE — Progress Notes (Signed)
   PRENATAL VISIT NOTE  Subjective:  Brandy [name] is a 28 y.o. [id number] at [redacted]w[redacted]d being seen today for ongoing prenatal care.  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  Patient doing well with no acute concerns today. She reports occasional contractions.  Contractions: Irritability. Vag. Bleeding: None.  Movement: Present. Denies leaking of fluid.   The following portions of the patient's history were reviewed and updated as appropriate: allergies, current medications, past family history, past medical history, past social history, past surgical history and problem list. Problem list updated.  Objective:   Vitals:   09/27/23 1327  BP: 107/70  Pulse: 75  Weight: 164 lb (74.4 kg)    Fetal Status: Fetal Heart Rate (bpm): 134 Fundal Height: 30 cm Movement: Present     General:  Alert, oriented and cooperative. Patient is in no acute distress.  Skin: Skin is warm and dry. No rash noted.   Cardiovascular: Normal heart rate noted  Respiratory: Normal respiratory effort, no problems with respiration noted  Abdomen: Soft, gravid, appropriate for gestational age.  Pain/Pressure: Present     Pelvic: Cervical exam performed        Extremities: Normal range of motion.  Edema: None  Mental Status:  Normal mood and affect. Normal behavior. Normal judgment and thought content.  CVX: visually closed during SSE Assessment and Plan:  Pregnancy: G4P0303 at [redacted]w[redacted]d  1. [redacted] weeks gestation of pregnancy (Primary)   2. History of preterm delivery, currently pregnant No s/sx of preterm labor, pt had negative FFN 6 days ago  3.  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 Rh negative state in antepartum period   5. Supervision of high risk pregnancy, antepartum Continue routine prenatal care  6. HSV-2 (herpes simplex virus 2) infection Prophylaxis at 36 weeks  Preterm labor symptoms and general obstetric precautions including but not limited to vaginal bleeding, contractions, leaking of fluid and fetal movement were reviewed in detail with the patient.  Please refer to After Visit Summary for other counseling recommendations.   Return in about 2 weeks (around 10/11/2023) for [facility], in person.   Mariel Aloe, MD Faculty Attending Center for [facility]

## 2023-09-28 ENCOUNTER — Encounter: Payer: Medicaid Other | Admitting: Physical Therapy

## 2023-09-29 DIAGNOSIS — Z3182 Encounter for Rh incompatibility status: Secondary | ICD-10-CM | POA: Insufficient documentation

## 2023-09-30 ENCOUNTER — Other Ambulatory Visit: Payer: Self-pay | Admitting: Lactation Services

## 2023-09-30 ENCOUNTER — Ambulatory Visit: Payer: Medicaid Other | Admitting: Physical Therapy

## 2023-09-30 ENCOUNTER — Encounter: Payer: Self-pay | Admitting: Lactation Services

## 2023-09-30 MED ORDER — CLINDESSE 2 % VA CREA: 1.0000 | TOPICAL_CREAM | Freq: Two times a day (BID) | VAGINAL | 1 refills | Status: AC

## 2023-09-30 NOTE — Progress Notes (Signed)
 Clindamycin not covered by insurance. Alternative sent to Pharmacy with approval from Dr. Shawnie Pons. Patient will be notified by Mychart.

## 2023-10-04 ENCOUNTER — Other Ambulatory Visit: Payer: Self-pay

## 2023-10-04 ENCOUNTER — Ambulatory Visit: Payer: Medicaid Other | Attending: Obstetrics and Gynecology

## 2023-10-04 DIAGNOSIS — O09293 Supervision of pregnancy with other poor reproductive or obstetric history, third trimester: Secondary | ICD-10-CM | POA: Diagnosis not present

## 2023-10-04 DIAGNOSIS — Z3A32 32 weeks gestation of pregnancy: Secondary | ICD-10-CM

## 2023-10-04 DIAGNOSIS — O360130 Maternal care for anti-D [Rh] antibodies, third trimester, not applicable or unspecified: Secondary | ICD-10-CM | POA: Diagnosis not present

## 2023-10-04 DIAGNOSIS — Z3182 Encounter for Rh incompatibility status: Secondary | ICD-10-CM | POA: Insufficient documentation

## 2023-10-06 ENCOUNTER — Telehealth: Payer: Self-pay

## 2023-10-06 NOTE — Telephone Encounter (Signed)
 [facility] pharmacy called to clarify on an order Clindesse. Would like to receive a call back at [phone].  Marcelino Duster, RN

## 2023-10-08 NOTE — Telephone Encounter (Signed)
 Called pharmacy; verified admin instructions BID for 7 days.

## 2023-10-11 ENCOUNTER — Ambulatory Visit

## 2023-10-11 ENCOUNTER — Ambulatory Visit: Payer: Medicaid Other | Attending: Obstetrics and Gynecology

## 2023-10-11 ENCOUNTER — Other Ambulatory Visit: Payer: Self-pay | Admitting: Obstetrics and Gynecology

## 2023-10-11 ENCOUNTER — Ambulatory Visit (HOSPITAL_BASED_OUTPATIENT_CLINIC_OR_DEPARTMENT_OTHER): Admitting: Obstetrics

## 2023-10-11 DIAGNOSIS — Z3A33 33 weeks gestation of pregnancy: Secondary | ICD-10-CM

## 2023-10-11 DIAGNOSIS — O09293 Supervision of pregnancy with other poor reproductive or obstetric history, third trimester: Secondary | ICD-10-CM

## 2023-10-11 DIAGNOSIS — O361920 Maternal care for other isoimmunization, second trimester, not applicable or unspecified: Secondary | ICD-10-CM

## 2023-10-11 DIAGNOSIS — O360130 Maternal care for anti-D [Rh] antibodies, third trimester, not applicable or unspecified: Secondary | ICD-10-CM | POA: Diagnosis not present

## 2023-10-11 DIAGNOSIS — O09213 Supervision of pregnancy with history of pre-term labor, third trimester: Secondary | ICD-10-CM

## 2023-10-11 DIAGNOSIS — O361130 Maternal care for Anti-A sensitization, third trimester, not applicable or unspecified: Secondary | ICD-10-CM

## 2023-10-11 MED ORDER — VITAMIN B-12 1000 MCG PO TABS: 1000.0000 ug | ORAL_TABLET | Freq: Every day | ORAL | 0 refills | Status: AC

## 2023-10-11 MED ORDER — BETAMETHASONE SOD PHOS & ACET 6 (3-3) MG/ML IJ SUSP
12.0000 mg | Freq: Once | INTRAMUSCULAR | Status: AC
  Administered 2023-10-11: 12 mg via INTRAMUSCULAR

## 2023-10-11 NOTE — Progress Notes (Signed)
 MFM Consult Note  Brandy [name] is currently at 33 weeks and 2 days.  She has been followed due to RH-D isoimmunization.    Her most recent anti-D antibody titer level drawn one month ago was too weak to titer.  She denies any problems since her last exam.    There was normal amniotic fluid noted on today's exam.  The fetus was in the vertex presentation.  A BPP performed today was 8 out of 10 with a reactive NST.  She received a -2 for fetal breathing movements that did not meet criteria.  The peak systolic velocity of the middle cerebral artery measured at 1.51 MoM, indicating that there is a possibility that the fetus is becoming anemic.    There were no signs of fetal hydrops noted on today's exam.  The patient was advised that in my experience, when the peak systolic velocity of the middle cerebral arteries (MCA) is borderline elevated, the peak systolic velocity may normalize (for a few weeks) following the administration of a complete course of antenatal corticosteroids.    The normalization of the peak systolic velocity may allow her to reach a more optimal gestational age (23 to 36 weeks or greater) for delivery.    The patient is agreeable to receiving a course of antenatal corticosteroids today.  This was administered by our nurse.  She will return tomorrow for the second dose of steroids.   She will return to our office later this week for another BPP and measurement of the peak systolic velocity of the middle cerebral artery.    Should the peak systolic velocity normalize, we will continue to follow her with weekly fetal testing and weekly MCA Doppler studies until delivery.    The patient stated that all of her questions were answered today.  A total of 30 minutes was spent counseling and coordinating the care for this patient.  Greater than 50% of the time was spent in direct face-to-face contact.

## 2023-10-11 NOTE — Procedures (Signed)
 Brandy [name] 25-May-1996 [redacted]w[redacted]d  Fetus A Non-Stress Test Interpretation for 10/11/23  Indication:  Isoimmunization  Fetal Heart Rate A Mode: External Baseline Rate (A): 130 bpm Variability: Moderate Accelerations: 15 x 15 Decelerations: Variable (one small variable noted) Multiple birth?: No  Uterine Activity Mode: Palpation, Toco Contraction Frequency (min): none noted Resting Tone Palpated: Relaxed  Interpretation (Fetal Testing) Nonstress Test Interpretation: Reactive Comments: Reviewed with Dr. Parke Poisson

## 2023-10-11 NOTE — Addendum Note (Signed)
 Addended by: Westland Bing on: 10/11/2023 07:30 PM   Modules accepted: Orders

## 2023-10-12 ENCOUNTER — Other Ambulatory Visit: Payer: Self-pay

## 2023-10-12 ENCOUNTER — Other Ambulatory Visit: Payer: Self-pay | Admitting: *Deleted

## 2023-10-12 ENCOUNTER — Ambulatory Visit: Payer: Medicaid Other | Admitting: Obstetrics and Gynecology

## 2023-10-12 ENCOUNTER — Ambulatory Visit: Attending: Obstetrics and Gynecology

## 2023-10-12 VITALS — BP 114/74 | HR 87 | Wt 165.8 lb

## 2023-10-12 DIAGNOSIS — O360130 Maternal care for anti-D [Rh] antibodies, third trimester, not applicable or unspecified: Secondary | ICD-10-CM

## 2023-10-12 DIAGNOSIS — O0990 Supervision of high risk pregnancy, unspecified, unspecified trimester: Secondary | ICD-10-CM

## 2023-10-12 DIAGNOSIS — Z3182 Encounter for Rh incompatibility status: Secondary | ICD-10-CM | POA: Diagnosis not present

## 2023-10-12 DIAGNOSIS — Z3A33 33 weeks gestation of pregnancy: Secondary | ICD-10-CM | POA: Diagnosis not present

## 2023-10-12 DIAGNOSIS — O26893 Other specified pregnancy related conditions, third trimester: Secondary | ICD-10-CM

## 2023-10-12 DIAGNOSIS — O0993 Supervision of high risk pregnancy, unspecified, third trimester: Secondary | ICD-10-CM

## 2023-10-12 DIAGNOSIS — B009 Herpesviral infection, unspecified: Secondary | ICD-10-CM

## 2023-10-12 DIAGNOSIS — O361930 Maternal care for other isoimmunization, third trimester, not applicable or unspecified: Secondary | ICD-10-CM

## 2023-10-12 DIAGNOSIS — O26899 Other specified pregnancy related conditions, unspecified trimester: Secondary | ICD-10-CM

## 2023-10-12 DIAGNOSIS — O360930 Maternal care for other rhesus isoimmunization, third trimester, not applicable or unspecified: Secondary | ICD-10-CM

## 2023-10-12 DIAGNOSIS — O09899 Supervision of other high risk pregnancies, unspecified trimester: Secondary | ICD-10-CM

## 2023-10-12 DIAGNOSIS — O09893 Supervision of other high risk pregnancies, third trimester: Secondary | ICD-10-CM

## 2023-10-12 DIAGNOSIS — O361920 Maternal care for other isoimmunization, second trimester, not applicable or unspecified: Secondary | ICD-10-CM

## 2023-10-12 DIAGNOSIS — Z6791 Unspecified blood type, Rh negative: Secondary | ICD-10-CM

## 2023-10-12 MED ORDER — BETAMETHASONE SOD PHOS & ACET 6 (3-3) MG/ML IJ SUSP
12.0000 mg | Freq: Once | INTRAMUSCULAR | Status: AC
  Administered 2023-10-12: 12 mg via INTRAMUSCULAR

## 2023-10-12 NOTE — Progress Notes (Signed)
   PRENATAL VISIT NOTE  Subjective:  Brandy [name] is a 28 y.o. [id number] at [redacted]w[redacted]d being seen today for ongoing prenatal care.  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  Patient doing well with no acute concerns today. She reports no complaints.  Contractions: Irregular. Vag. Bleeding: None.  Movement: Present. Denies leaking of fluid.   The following portions of the patient's history were reviewed and updated as appropriate: allergies, current medications, past family history, past medical history, past social history, past surgical history and problem list. Problem list updated.  Objective:   Vitals:   10/12/23 1519  BP: 114/74  Pulse: 87  Weight: 165 lb 12.8 oz (75.2 kg)    Fetal Status: Fetal Heart Rate (bpm): 131 Fundal Height: 33 cm Movement: Present     General:  Alert, oriented and cooperative. Patient is in no acute distress.  Skin: Skin is warm and dry. No rash noted.   Cardiovascular: Normal heart rate noted  Respiratory: Normal respiratory effort, no problems with respiration noted  Abdomen: Soft, gravid, appropriate for gestational age.  Pain/Pressure: Present     Pelvic: Cervical exam deferred        Extremities: Normal range of motion.  Edema: None  Mental Status:  Normal mood and affect. Normal behavior. Normal judgment and thought content.   Assessment and Plan:  Pregnancy: [id number] at [redacted]w[redacted]d  1. [redacted] weeks gestation of pregnancy (Primary)   2. Supervision of high risk pregnancy, antepartum Continue routine prenatal care  3. Rh negative state in  antepartum period   4. 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  Pt has now received a course of betamethasone x 2 that may aid in treating the MCA velocity. If it normalizes, MFM may look at delivery at 35-36 weeks.  She will continue weekly dopplers  5. Maternal red cell alloimmunization in second trimester, single or unspecified fetus   6. History of preterm delivery, currently pregnant SSE performed, cervix visually closed. No other s/sx of preterm labor  7. HSV-2 (herpes simplex virus 2) infection Prophylaxis is 34-35 weeks  Preterm labor symptoms and general obstetric precautions including but not limited to vaginal bleeding, contractions, leaking of fluid and fetal movement were reviewed in detail with the patient.  Please refer to After Visit Summary for other counseling recommendations.   Return in about 1 week (around 10/19/2023) for [employer], in person.   Mariel Aloe, MD Faculty Attending Center for [facility]

## 2023-10-13 LAB — SPECIMEN STATUS REPORT

## 2023-10-13 LAB — METHYLMALONIC ACID, SERUM: Methylmalonic Acid: 158 nmol/L (ref 0–378)

## 2023-10-14 ENCOUNTER — Telehealth: Payer: Self-pay | Admitting: Lactation Services

## 2023-10-14 ENCOUNTER — Ambulatory Visit: Attending: Obstetrics | Admitting: Obstetrics

## 2023-10-14 ENCOUNTER — Ambulatory Visit: Attending: Obstetrics

## 2023-10-14 ENCOUNTER — Other Ambulatory Visit: Payer: Self-pay | Admitting: *Deleted

## 2023-10-14 DIAGNOSIS — Z3A33 33 weeks gestation of pregnancy: Secondary | ICD-10-CM | POA: Diagnosis not present

## 2023-10-14 DIAGNOSIS — O09899 Supervision of other high risk pregnancies, unspecified trimester: Secondary | ICD-10-CM | POA: Diagnosis present

## 2023-10-14 DIAGNOSIS — Z3182 Encounter for Rh incompatibility status: Secondary | ICD-10-CM

## 2023-10-14 DIAGNOSIS — O360930 Maternal care for other rhesus isoimmunization, third trimester, not applicable or unspecified: Secondary | ICD-10-CM | POA: Insufficient documentation

## 2023-10-14 DIAGNOSIS — O360130 Maternal care for anti-D [Rh] antibodies, third trimester, not applicable or unspecified: Secondary | ICD-10-CM | POA: Insufficient documentation

## 2023-10-14 DIAGNOSIS — O09293 Supervision of pregnancy with other poor reproductive or obstetric history, third trimester: Secondary | ICD-10-CM | POA: Diagnosis not present

## 2023-10-14 DIAGNOSIS — O361130 Maternal care for Anti-A sensitization, third trimester, not applicable or unspecified: Secondary | ICD-10-CM | POA: Diagnosis not present

## 2023-10-14 NOTE — Telephone Encounter (Signed)
 Called to Clarify Clindesse prescription dosage and timing. Approved to give 1 dose of 5 grams x 1 per standard dosing.

## 2023-10-14 NOTE — Progress Notes (Signed)
 MFM Consult Note  Brandy [name] is currently at 33 weeks and 5 days.  Brandy [name] has been followed due to RH-D isoimmunization.  The peak systolic velocity of the middle cerebral arteries were borderline elevated (1.51 MoM) 3 days ago.  Due to this finding, Brandy [name] was given a complete course of antenatal corticosteroids with the hope that the MCA Doppler studies will normalize following the administration of steroids.  Brandy [name] denies any problems since her last exam and reports feeling fetal movements throughout the day.  There was normal amniotic fluid noted on today's exam.  The fetus was in the vertex presentation.  A BPP performed today was 8/8.  The peak systolic velocity of the middle cerebral artery was less than 1.5 multiple of the median for her gestational age, indicating that perhaps the steroids worked.  There were no signs of fetal hydrops noted on today's exam.   We will continue to follow her with weekly MCA Doppler studies and weekly fetal testing until delivery.    The patient understands that delivery will be recommended after 34 weeks should the peak systolic velocity of the middle cerebral arteries measure greater than 1.5 MoM's again.  Should the peak systolic velocity of the middle cerebral arteries be normal each week, due to isoimmunization, delivery is recommended at around 37 weeks.    Brandy [name] will return next week for another growth scan, BPP, and MCA Doppler study.  The patient stated that all of her questions were answered today.  A total of 20 minutes was spent counseling and coordinating the care for this patient.  Greater than 50% of the time was spent in direct face-to-face contact.

## 2023-10-18 ENCOUNTER — Ambulatory Visit: Payer: Medicaid Other | Attending: Obstetrics and Gynecology

## 2023-10-18 DIAGNOSIS — O360930 Maternal care for other rhesus isoimmunization, third trimester, not applicable or unspecified: Secondary | ICD-10-CM | POA: Diagnosis present

## 2023-10-18 DIAGNOSIS — O09293 Supervision of pregnancy with other poor reproductive or obstetric history, third trimester: Secondary | ICD-10-CM

## 2023-10-18 DIAGNOSIS — O360130 Maternal care for anti-D [Rh] antibodies, third trimester, not applicable or unspecified: Secondary | ICD-10-CM | POA: Insufficient documentation

## 2023-10-18 DIAGNOSIS — O09899 Supervision of other high risk pregnancies, unspecified trimester: Secondary | ICD-10-CM | POA: Insufficient documentation

## 2023-10-18 DIAGNOSIS — Z3A34 34 weeks gestation of pregnancy: Secondary | ICD-10-CM

## 2023-10-18 DIAGNOSIS — O09213 Supervision of pregnancy with history of pre-term labor, third trimester: Secondary | ICD-10-CM

## 2023-10-19 ENCOUNTER — Encounter (HOSPITAL_COMMUNITY): Payer: Self-pay | Admitting: Obstetrics and Gynecology

## 2023-10-19 ENCOUNTER — Inpatient Hospital Stay (HOSPITAL_COMMUNITY)
Admission: AD | Admit: 2023-10-19 | Discharge: 2023-10-19 | Disposition: A | Discharge status: Home / Self Care | Attending: Obstetrics and Gynecology | Admitting: Obstetrics and Gynecology

## 2023-10-19 DIAGNOSIS — Z3A34 34 weeks gestation of pregnancy: Secondary | ICD-10-CM | POA: Insufficient documentation

## 2023-10-19 DIAGNOSIS — O98813 Other maternal infectious and parasitic diseases complicating pregnancy, third trimester: Secondary | ICD-10-CM | POA: Diagnosis not present

## 2023-10-19 DIAGNOSIS — O26893 Other specified pregnancy related conditions, third trimester: Secondary | ICD-10-CM | POA: Diagnosis not present

## 2023-10-19 DIAGNOSIS — R109 Unspecified abdominal pain: Secondary | ICD-10-CM

## 2023-10-19 DIAGNOSIS — M549 Dorsalgia, unspecified: Secondary | ICD-10-CM | POA: Diagnosis not present

## 2023-10-19 DIAGNOSIS — R1030 Lower abdominal pain, unspecified: Secondary | ICD-10-CM | POA: Diagnosis not present

## 2023-10-19 DIAGNOSIS — B379 Candidiasis, unspecified: Secondary | ICD-10-CM | POA: Insufficient documentation

## 2023-10-19 LAB — WET PREP, GENITAL
Clue Cells Wet Prep HPF POC: NONE SEEN
Sperm: NONE SEEN
Trich, Wet Prep: NONE SEEN
WBC, Wet Prep HPF POC: >=10 — AB (ref <10)

## 2023-10-19 LAB — URINALYSIS, ROUTINE W REFLEX MICROSCOPIC
Bilirubin Urine: NEGATIVE
Glucose, UA: NEGATIVE mg/dL
Hgb urine dipstick: NEGATIVE
Ketones, ur: NEGATIVE mg/dL
Nitrite: NEGATIVE
Protein, ur: NEGATIVE mg/dL
Specific Gravity, Urine: 1.012 (ref 1.005–1.030)
pH: 6 (ref 5.0–8.0)

## 2023-10-19 MED ORDER — ACETAMINOPHEN 500 MG PO TABS
1000.0000 mg | ORAL_TABLET | Freq: Once | ORAL | Status: AC
  Administered 2023-10-19: 1000 mg via ORAL
  Filled 2023-10-19: qty 2

## 2023-10-19 MED ORDER — TERCONAZOLE 0.4 % VA CREA: 1.0000 | TOPICAL_CREAM | Freq: Every day | VAGINAL | 0 refills | Status: DC

## 2023-10-19 NOTE — MAU Note (Signed)
.  Brandy [name] is a 28 y.o. at [redacted]w[redacted]d here in MAU reporting: abdominal and back cramping that started three days ago. Pain is intermittent. She reports taking Tylenol this AM and sleeping all day, but reports that she still feels the cramps in her sleep. Denies VB or LOF. Reports +FM.  LMP: N/A Onset of complaint: 3 days ago Pain score: 8/10 Vitals:   10/19/23 1609  BP: 115/68  Pulse: 97  Resp: 18  Temp: 98.6 F (37 C)  SpO2: 100%     FHT: bypass triage, pt taken directly to room  Lab orders placed from triage: UA

## 2023-10-19 NOTE — MAU Provider Note (Signed)
 History     CSN: [id number]  Arrival date and time: 10/19/23 1551   Event Date/Time   First Provider Initiated Contact with Patient 10/19/23 1617      Chief Complaint  Patient presents with   Abdominal Pain   Back Pain   Brandy [name] , a  28 y.o. [id number] at [redacted]w[redacted]d presents to MAU with complaints of lower abdominal cramping and back pain. Patient states that for the last 3 days she has had intermittent period like cramping that has been unrelieved with PO Tylenol. She states that the pain woke her up out of her sleep and currently rates as a 7/10. She reports also losing her mucus plug 3 days ago. Denies abnormal vaginal discharge, vaginal bleeding, recent intercourse and endorses positive fetal movement.   Hx of PTB x3 at 36 weeks.       OB History     Gravida  4   Para  3   Term  0   Preterm  3   AB      Living  3      SAB      IAB      Ectopic      Multiple  0   Live Births  3           Past Medical History:  Diagnosis Date   Chlamydia    Heart murmur    Diagnosed as a child   Migraine     Past Surgical History:  Procedure Laterality Date   NO PAST SURGERIES      Family History  Problem Relation Age of Onset   Asthma Mother    Cancer Neg Hx    Diabetes Neg Hx    Heart disease Neg Hx    Hypertension Neg Hx     Social History   Tobacco Use   Smoking status: Never   Smokeless tobacco: Never  Vaping Use   Vaping status: Never Used  Substance Use Topics   Alcohol use: Not Currently   Drug use: No    Allergies:  Allergies  Allergen Reactions   Kiwi Extract Swelling   Metronidazole And Related Itching   Penicillins Hives and Other (See Comments)    Has patient had a PCN reaction causing immediate rash, facial/tongue/throat swelling, SOB or lightheadedness with hypotension: Yes Has patient had a PCN reaction causing severe rash involving mucus membranes or skin necrosis: No Has patient had a PCN reaction that required  hospitalization No Has patient had a PCN reaction occurring within the last 10 years: No If all of the above answers are "NO", then may proceed with Cephalosporin use.    Lobster [Shellfish Allergy] Rash   Reglan [Metoclopramide] Diarrhea    Medications Prior to Admission  Medication Sig Dispense Refill Last Dose/Taking   acetaminophen (TYLENOL) 500 MG tablet Take 500 mg by mouth every 6 (six) hours as needed.   10/19/2023 at 10:00 AM   Cholecalciferol (VITAMIN D-3) 125 MCG (5000 UT) TABS Take 1 tablet by mouth daily. 60 tablet 1 10/19/2023   cyanocobalamin (VITAMIN B12) 1000 MCG tablet Take 1 tablet (1,000 mcg total) by mouth daily. 42 tablet 0 10/19/2023   cyclobenzaprine (FLEXERIL) 5 MG tablet Take 1 tablet (5 mg total) by mouth 3 (three) times daily as needed for muscle spasms. 30 tablet 0 Past Week   ondansetron (ZOFRAN-ODT) 4 MG disintegrating tablet Take 1 tablet (4 mg total) by mouth every 8 (eight) hours as needed for nausea or vomiting.  15 tablet 1 10/19/2023   pantoprazole (PROTONIX) 20 MG tablet Take 1 tablet (20 mg total) by mouth daily. 30 tablet 3 10/19/2023   Prenatal MV & Min w/FA-DHA (PRENATAL GUMMIES) 0.18-25 MG CHEW Chew by mouth.   10/19/2023   Blood Pressure Monitoring DEVI 1 each by Does not apply route once a week. (Patient not taking: Reported on 08/24/2023) 1 each 0     Review of Systems  Constitutional:  Negative for chills, fatigue and fever.  Eyes:  Negative for pain and visual disturbance.  Respiratory:  Negative for apnea, shortness of breath and wheezing.   Cardiovascular:  Negative for chest pain and palpitations.  Gastrointestinal:  Positive for abdominal pain. Negative for constipation, diarrhea, nausea and vomiting.  Genitourinary:  Positive for pelvic pain. Negative for difficulty urinating, dysuria, vaginal bleeding, vaginal discharge and vaginal pain.  Musculoskeletal:  Positive for back pain.  Neurological:  Negative for seizures, weakness and headaches.   Psychiatric/Behavioral:  Negative for suicidal ideas.    Physical Exam   Blood pressure 117/61, pulse (!) 113, temperature 98.6 F (37 C), temperature source Oral, resp. rate 18, height 5\' 4"  (1.626 m), weight 73.9 kg, last menstrual period 02/20/2023, SpO2 99%.  Physical Exam Vitals and nursing note reviewed. Exam conducted with a chaperone present.  Constitutional:      General: She is not in acute distress.    Appearance: Normal appearance.  HENT:     Head: Normocephalic.  Pulmonary:     Effort: Pulmonary effort is normal.  Abdominal:     Palpations: Abdomen is soft.     Tenderness: There is no abdominal tenderness.     Comments: Gravid uterus   Genitourinary:    Vagina: Vaginal discharge present.     Comments: Dilation: Fingertip Exam by:: Dorathy Daft, CNM  Musculoskeletal:     Cervical back: Normal range of motion.  Skin:    General: Skin is warm and dry.  Neurological:     Mental Status: She is alert and oriented to person, place, and time.  Psychiatric:        Mood and Affect: Mood normal.   FHT: 135bpm with moderate variability. 15x15 aacels present no decels.  Toco: Irritability with occasional contractions.  MAU Course  Procedures Orders Placed This Encounter  Procedures   Wet prep, genital   Urinalysis, Routine w reflex microscopic -Urine, Clean Catch    MDM - Dilation: Fingertip Exam by:: Dorathy Daft, CNM - low suspicion for Preterm labor.  - WEt prep positive for yeast  - plan for discharge.    Assessment and Plan   1. Yeast infection   2. [redacted] weeks gestation of pregnancy   3. Abdominal cramping    - Reviewed that certain vaginal infections can cause vaginal irritation and uterine irritability.  - Rx for Terazole sent to outpatient pharmacy.  - Reviewed worsening signs and return precautions.  - FHT appropriate for gestational age at time of discharge.  - Patient discharged home in stable condition and may return to MAU as needed.   Claudette Head, MSN CNM  10/19/2023, 4:17 PM

## 2023-10-21 ENCOUNTER — Ambulatory Visit: Admitting: Obstetrics & Gynecology

## 2023-10-21 ENCOUNTER — Other Ambulatory Visit: Payer: Self-pay

## 2023-10-21 ENCOUNTER — Encounter: Payer: Self-pay | Admitting: Obstetrics & Gynecology

## 2023-10-21 VITALS — BP 117/77 | HR 87 | Wt 168.0 lb

## 2023-10-21 DIAGNOSIS — O09893 Supervision of other high risk pregnancies, third trimester: Secondary | ICD-10-CM | POA: Diagnosis not present

## 2023-10-21 DIAGNOSIS — Z3A34 34 weeks gestation of pregnancy: Secondary | ICD-10-CM

## 2023-10-21 DIAGNOSIS — Z3182 Encounter for Rh incompatibility status: Secondary | ICD-10-CM

## 2023-10-21 DIAGNOSIS — O0993 Supervision of high risk pregnancy, unspecified, third trimester: Secondary | ICD-10-CM

## 2023-10-21 DIAGNOSIS — B009 Herpesviral infection, unspecified: Secondary | ICD-10-CM

## 2023-10-21 DIAGNOSIS — O0990 Supervision of high risk pregnancy, unspecified, unspecified trimester: Secondary | ICD-10-CM

## 2023-10-21 DIAGNOSIS — O09899 Supervision of other high risk pregnancies, unspecified trimester: Secondary | ICD-10-CM

## 2023-10-21 MED ORDER — VALACYCLOVIR HCL 500 MG PO TABS: 500.0000 mg | ORAL_TABLET | Freq: Two times a day (BID) | ORAL | 6 refills | Status: DC

## 2023-10-21 NOTE — Progress Notes (Signed)
   PRENATAL VISIT NOTE  Subjective:  Brandy [name] is a 28 y.o. [id number] at [redacted]w[redacted]d being seen today for ongoing prenatal care.  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  Patient reports occasional contractions.  Contractions: Not present. Vag. Bleeding: None.  Movement: Present. Denies leaking of fluid.   The following portions of the patient's history were reviewed and updated as appropriate: allergies, current medications, past family history, past medical history, past social history, past surgical history and problem list.   Objective:   Vitals:   10/21/23 1328  BP: 117/77  Pulse: 87  Weight: 168 lb (76.2 kg)    Fetal Status: Fetal Heart Rate (bpm): 143   Movement: Present  Presentation: Vertex  General:  Alert, oriented and cooperative. Patient is in no acute distress.  Skin: Skin is warm and dry. No rash noted.   Cardiovascular: Normal heart rate noted  Respiratory: Normal respiratory effort, no problems with respiration noted  Abdomen: Soft, gravid, appropriate for gestational age.  Pain/Pressure: Present     Pelvic: Cervical exam performed in the presence of a chaperone Dilation: Fingertip Effacement (%): 30 Station: Ballotable  Extremities: Normal range of motion.  Edema: None  Mental Status: Normal mood and affect. Normal behavior. Normal judgment and thought content.   Assessment and Plan:  Pregnancy: [id number] at [redacted]w[redacted]d 1. Supervision of high risk pregnancy, antepartum (Primary) - Continue HSV2 monitoring. Initiate valacyclovir prophylaxis. - Continue  routine preterm monitoring.  - Continue alloimmunization monitoring. Check antibody titers and MCA dopplers as indicated.   Preterm labor symptoms and general obstetric precautions including but not limited to vaginal bleeding, contractions, leaking of fluid and fetal movement were reviewed in detail with the patient. Please refer to After Visit Summary for other counseling recommendations.   Return in about 1 week (around 10/28/2023).  Future Appointments  Date Time Provider Department Center  10/28/2023  1:15 PM Adam Phenix, MD [facility]  10/29/2023  7:30 AM [facility] US2 [facility]  11/05/2023  7:30 AM [facility] US4 [facility]  11/05/2023 11:15 AM Adam Phenix, MD [address] [facility]  11/11/2023  2:35 PM Warden Fillers, MD [facility]    Scheryl Darter, MD

## 2023-10-28 ENCOUNTER — Other Ambulatory Visit: Payer: Self-pay

## 2023-10-28 ENCOUNTER — Ambulatory Visit (INDEPENDENT_AMBULATORY_CARE_PROVIDER_SITE_OTHER): Admitting: Obstetrics & Gynecology

## 2023-10-28 ENCOUNTER — Other Ambulatory Visit (HOSPITAL_COMMUNITY)
Admission: RE | Admit: 2023-10-28 | Discharge: 2023-10-28 | Disposition: A | Discharge status: Home / Self Care | Source: Ambulatory Visit | Attending: Obstetrics & Gynecology | Admitting: Obstetrics & Gynecology

## 2023-10-28 DIAGNOSIS — O09893 Supervision of other high risk pregnancies, third trimester: Secondary | ICD-10-CM

## 2023-10-28 DIAGNOSIS — O0990 Supervision of high risk pregnancy, unspecified, unspecified trimester: Secondary | ICD-10-CM

## 2023-10-28 DIAGNOSIS — Z3A35 35 weeks gestation of pregnancy: Secondary | ICD-10-CM

## 2023-10-28 DIAGNOSIS — O09899 Supervision of other high risk pregnancies, unspecified trimester: Secondary | ICD-10-CM

## 2023-10-28 DIAGNOSIS — O0993 Supervision of high risk pregnancy, unspecified, third trimester: Secondary | ICD-10-CM

## 2023-10-28 DIAGNOSIS — Z6791 Unspecified blood type, Rh negative: Secondary | ICD-10-CM

## 2023-10-28 DIAGNOSIS — O26893 Other specified pregnancy related conditions, third trimester: Secondary | ICD-10-CM | POA: Diagnosis not present

## 2023-10-28 NOTE — Progress Notes (Addendum)
   PRENATAL VISIT NOTE  Subjective:  Brandy [name] is a 28 y.o. [id number] at [redacted]w[redacted]d being seen today for ongoing prenatal care.  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  Patient reports braxton-hicks contractions. Contractions: Irritability. Vag. Bleeding: None.  Movement: Present. Denies leaking of fluid.   The following portions of the patient's history were reviewed and updated as appropriate: allergies, current medications, past family history, past medical history, past social history, past surgical history and problem list.   Objective:   Vitals:   10/28/23 1316  BP: 112/71  Pulse: 73  Weight: 169 lb (76.7 kg)    Fetal Status: Fetal Heart Rate (bpm): 152   Movement: Present     General:  Alert, oriented and cooperative. Patient is in no acute distress.  Skin: Skin is warm and dry. No rash noted.   Cardiovascular: Normal heart rate noted  Respiratory: Normal respiratory effort, no problems with respiration noted  Abdomen: Soft, gravid, appropriate for gestational age.  Pain/Pressure: Present     Pelvic: Cervical exam performed in the presence of a chaperone        Extremities: Normal range of motion.     Mental Status: Normal mood and affect. Normal behavior. Normal judgment and thought content.   Assessment and Plan:  Pregnancy: [id number] at [redacted]w[redacted]d 1. Supervision of high risk pregnancy, antepartum (Primary) - Continue HSV2 monitoring. - Continue routine preterm monitoring.  2. History of preterm delivery, currently pregnant - Continue to monitor for signs  of preterm delivery.  3. Rh negative state in antepartum period - Continue alloimmunization monitoring. Check antibody titers and MCA dopplers as indicated.   Preterm labor symptoms and general obstetric precautions including but not limited to vaginal bleeding, contractions, leaking of fluid and fetal movement were reviewed in detail with the patient. Please refer to After Visit Summary for other counseling recommendations.   No follow-ups on file.  Future Appointments  Date Time Provider Department Center  10/29/2023  7:30 AM [facility] US[facility]  11/05/2023  7:30 AM [facility] US4 [facility]  11/05/2023 11:15 AM Adam Phenix, MD [facility]  11/11/2023  2:35 PM Warden Fillers, MD [facility]    Kerrin Mo, Medical Student Attestation of Attending Supervision of Medical Student: Evaluation and management procedures were performed by the medical student under my supervision and collaboration.  I have reviewed the student's note and chart, and I agree with the management and plan.  Scheryl Darter, MD, FACOG Attending Obstetrician & Gynecologist Faculty Practice, [facility]

## 2023-10-29 ENCOUNTER — Ambulatory Visit: Attending: Obstetrics

## 2023-10-29 ENCOUNTER — Inpatient Hospital Stay (HOSPITAL_COMMUNITY)
Admission: AD | Admit: 2023-10-29 | Discharge: 2023-10-30 | Disposition: A | Discharge status: Home / Self Care | Payer: Self-pay | Attending: Obstetrics and Gynecology | Admitting: Obstetrics and Gynecology

## 2023-10-29 ENCOUNTER — Encounter (HOSPITAL_COMMUNITY): Payer: Self-pay | Admitting: Obstetrics and Gynecology

## 2023-10-29 DIAGNOSIS — Z3A35 35 weeks gestation of pregnancy: Secondary | ICD-10-CM | POA: Diagnosis not present

## 2023-10-29 DIAGNOSIS — O09213 Supervision of pregnancy with history of pre-term labor, third trimester: Secondary | ICD-10-CM

## 2023-10-29 DIAGNOSIS — Z3182 Encounter for Rh incompatibility status: Secondary | ICD-10-CM | POA: Diagnosis present

## 2023-10-29 DIAGNOSIS — O360130 Maternal care for anti-D [Rh] antibodies, third trimester, not applicable or unspecified: Secondary | ICD-10-CM | POA: Diagnosis not present

## 2023-10-29 DIAGNOSIS — B3731 Acute candidiasis of vulva and vagina: Secondary | ICD-10-CM | POA: Insufficient documentation

## 2023-10-29 DIAGNOSIS — Z3A36 36 weeks gestation of pregnancy: Secondary | ICD-10-CM

## 2023-10-29 DIAGNOSIS — O4700 False labor before 37 completed weeks of gestation, unspecified trimester: Secondary | ICD-10-CM

## 2023-10-29 DIAGNOSIS — O09293 Supervision of pregnancy with other poor reproductive or obstetric history, third trimester: Secondary | ICD-10-CM

## 2023-10-29 DIAGNOSIS — O219 Vomiting of pregnancy, unspecified: Secondary | ICD-10-CM | POA: Diagnosis present

## 2023-10-29 DIAGNOSIS — B009 Herpesviral infection, unspecified: Secondary | ICD-10-CM | POA: Insufficient documentation

## 2023-10-29 DIAGNOSIS — O98513 Other viral diseases complicating pregnancy, third trimester: Secondary | ICD-10-CM | POA: Insufficient documentation

## 2023-10-29 DIAGNOSIS — O4703 False labor before 37 completed weeks of gestation, third trimester: Secondary | ICD-10-CM | POA: Diagnosis not present

## 2023-10-29 DIAGNOSIS — Z0371 Encounter for suspected problem with amniotic cavity and membrane ruled out: Secondary | ICD-10-CM | POA: Diagnosis present

## 2023-10-29 LAB — URINALYSIS, ROUTINE W REFLEX MICROSCOPIC
Bilirubin Urine: NEGATIVE
Glucose, UA: NEGATIVE mg/dL
Hgb urine dipstick: NEGATIVE
Ketones, ur: 20 mg/dL — AB
Leukocytes,Ua: NEGATIVE
Nitrite: NEGATIVE
Protein, ur: NEGATIVE mg/dL
Specific Gravity, Urine: 1.016 (ref 1.005–1.030)
pH: 6 (ref 5.0–8.0)

## 2023-10-29 MED ORDER — LACTATED RINGERS IV BOLUS
1000.0000 mL | Freq: Once | INTRAVENOUS | Status: AC
  Administered 2023-10-29: 1000 mL via INTRAVENOUS

## 2023-10-29 MED ORDER — NIFEDIPINE 10 MG PO CAPS
10.0000 mg | ORAL_CAPSULE | ORAL | Status: AC | PRN
  Administered 2023-10-29 (×3): 10 mg via ORAL
  Filled 2023-10-29 (×3): qty 1

## 2023-10-29 NOTE — MAU Note (Addendum)
 Pt says she was here last Friday - FT. Went to Dr - clinic. VE 2 cm Has hx HSV- takes Valtrex- daily. Denies any S/S now . No outbreak during preg. Feels UC- strong since - 5pm.  GBS- not collected  Says at 3pm- she vomited - pants wet.. Nauseated- took took Zofran- at 3pm - not help

## 2023-10-29 NOTE — MAU Provider Note (Addendum)
 History     CSN: [id number]  Arrival date and time: 10/29/23 2036   None     Chief Complaint  Patient presents with   Contractions   HPI  Brandy [name] is a 28 y.o. [phone] at [redacted]w[redacted]d who reports nausea for the last couple of days she reports taking her zofran with little relief. Patient report 1-2 emesis episodes for the last 4 days. Patient reports contraction 8/10 sharp shooting pain. It started at 1712. Patient has not been timing them. Patient reports a headache at 1710 that was 10/10 she took 650 mg of tylenol and it reduced to 5/10. She reports that when she vomited today she soaked her pants and now has vaginal moisture ever since. Patient reports she was here on 10/19/2023 and was told she had a yeast infection reports she took the medicine, but symptoms are still present like increased vaginal discharge.  Patient rates the pain as a 8/10 and has tried tylenol with no relief.     She denies any vaginal bleeding. Denies any constipation, diarrhea or any urinary complaints. Reports normal fetal movement.   OB History     Gravida  4   Para  3   Term  0   Preterm  3   AB  0   Living  3      SAB  0   IAB  0   Ectopic  0   Multiple  0   Live Births  3           Past Medical History:  Diagnosis Date   Chlamydia    Heart murmur    Diagnosed as a child   Migraine     Past Surgical History:  Procedure Laterality Date   NO PAST SURGERIES      Family History  Problem Relation Age of Onset   Asthma Mother    Cancer Neg Hx    Diabetes Neg Hx    Heart disease Neg Hx    Hypertension Neg Hx     Social History   Tobacco Use   Smoking status: Never   Smokeless tobacco: Never  Vaping Use   Vaping status: Never Used  Substance Use Topics   Alcohol use: Not Currently   Drug use: No    Allergies:  Allergies  Allergen Reactions   Kiwi Extract Swelling   Metronidazole And Related Itching   Penicillins Hives and Other (See Comments)    Has  patient had a PCN reaction causing immediate rash, facial/tongue/throat swelling, SOB or lightheadedness with hypotension: Yes Has patient had a PCN reaction causing severe rash involving mucus membranes or skin necrosis: No Has patient had a PCN reaction that required hospitalization No Has patient had a PCN reaction occurring within the last 10 years: No If all of the above answers are "NO", then may proceed with Cephalosporin use.    Lobster [Shellfish Allergy] Rash   Reglan [Metoclopramide] Diarrhea    Medications Prior to Admission  Medication Sig Dispense Refill Last Dose/Taking   Cholecalciferol (VITAMIN D-3) 125 MCG (5000 UT) TABS Take 1 tablet by mouth daily. 60 tablet 1 10/29/2023   cyanocobalamin (VITAMIN B12) 1000 MCG tablet Take 1 tablet (1,000 mcg total) by mouth daily. 42 tablet 0 10/29/2023   ondansetron (ZOFRAN-ODT) 4 MG disintegrating tablet Take 1 tablet (4 mg total) by mouth every 8 (eight) hours as needed for nausea or vomiting. 15 tablet 1 10/29/2023   pantoprazole (PROTONIX) 20 MG tablet Take 1 tablet (  20 mg total) by mouth daily. 30 tablet 3 10/29/2023   Prenatal MV & Min w/FA-DHA (PRENATAL GUMMIES) 0.18-25 MG CHEW Chew by mouth.   10/29/2023   terconazole (TERAZOL 7) 0.4 % vaginal cream Place 1 applicator vaginally at bedtime. Use for seven days 45 g 0 10/28/2023   valACYclovir (VALTREX) 500 MG tablet Take 1 tablet (500 mg total) by mouth 2 (two) times daily. 60 tablet 6 10/29/2023   acetaminophen (TYLENOL) 500 MG tablet Take 500 mg by mouth every 6 (six) hours as needed.      cyclobenzaprine (FLEXERIL) 5 MG tablet Take 1 tablet (5 mg total) by mouth 3 (three) times daily as needed for muscle spasms. 30 tablet 0     Review of Systems  Constitutional:  Negative for chills and fever.  Respiratory:  Negative for shortness of breath.   Cardiovascular:  Negative for chest pain.  Gastrointestinal:  Positive for abdominal pain and diarrhea.  Genitourinary:  Positive for  frequency and vaginal discharge. Negative for difficulty urinating and vaginal bleeding.  Musculoskeletal:  Positive for back pain.  All other systems reviewed and are negative.  Physical Exam   Blood pressure 102/63, pulse 97, temperature 98.7 F (37.1 C), resp. rate 16, weight 76.7 kg, last menstrual period 02/20/2023.  Patient Vitals for the past 24 hrs:  BP Temp Pulse Resp Weight  10/29/23 2048 102/63 98.7 F (37.1 C) 97 16 76.7 kg    Physical Exam VS reviewed, nursing note reviewed,  Constitutional: well developed, well nourished, no distress HEENT: normocephalic CV: normal rate Pulm/chest wall: normal effort Abdomen: soft Neuro: alert and oriented x 3 Skin: warm, dry Psych: affect normal   SSE: cervix visually closed, no pooling with valsalva  Dilation: 1 Effacement (%): Thick Cervical Position: Posterior Exam by:: Sharen Counter, CNM     Fetal Tracing:  FHR baseline 135, moderate variability, positive accelerations, no decelerations. TOCO: contractions every 1-6 minutes, mild to moderate to palpation.       MAU Course  Procedures  Results for orders placed or performed during the hospital encounter of 10/29/23 (from the past 24 hours)  Urinalysis, Routine w reflex microscopic -Urine, Clean Catch     Status: Abnormal   Collection Time: 10/29/23 10:14 PM  Result Value Ref Range   Color, Urine YELLOW YELLOW   APPearance CLEAR CLEAR   Specific Gravity, Urine 1.016 1.005 - 1.030   pH 6.0 5.0 - 8.0   Glucose, UA NEGATIVE NEGATIVE mg/dL   Hgb urine dipstick NEGATIVE NEGATIVE   Bilirubin Urine NEGATIVE NEGATIVE   Ketones, ur 20 (A) NEGATIVE mg/dL   Protein, ur NEGATIVE NEGATIVE mg/dL   Nitrite NEGATIVE NEGATIVE   Leukocytes,Ua NEGATIVE NEGATIVE  Fern Test     Status: None   Collection Time: 10/30/23  1:59 AM  Result Value Ref Range   POCT Fern Test Negative = intact amniotic membranes        Procardia and IV fluids ordered.  Sharen Counter,  CNM, assumed care of pt.  Zenia Resides, RN 10/29/2023, 9:15 PM    CNM attestation:  I have seen and examined this patient; I agree with above documentation in the NP student's note.   Brandy [name] is a 28 y.o. [id number] reporting cramping/contractions, n/v, and leaking fluid when vomiting.    PE: BP 113/67 (BP Location: Right Arm)   Pulse 74   Temp 98.7 F (37.1 C)   Resp 16   Wt 76.7 kg   LMP 02/20/2023   BMI  29.01 kg/m  Gen: calm comfortable, NAD Resp: normal effort, no distress Abd: gravid  ROS, labs, PMH reviewed NST reactive   MDM: Cervix 1/long/high, no evidence of preterm labor. After Procardia and IV fluids, pt reports no change in contractions so Stadol 2 mg IV given. Pt reported dizziness and vomiting after stadol.  Cervix unchanged, no evidence of ROM with negative pooling and ferning.    Plan: D/C home Fetal kick counts reinforced, preterm labor precautions Rx for Diflucan for preexisting yeast infection with symptoms not improving Continue routine follow up in OB clinic Return to MAU as needed for emergencies  Sharen Counter, CNM 1:51 AM

## 2023-10-30 DIAGNOSIS — O4703 False labor before 37 completed weeks of gestation, third trimester: Secondary | ICD-10-CM | POA: Diagnosis not present

## 2023-10-30 DIAGNOSIS — Z3A36 36 weeks gestation of pregnancy: Secondary | ICD-10-CM | POA: Diagnosis not present

## 2023-10-30 LAB — POCT FERN TEST: POCT Fern Test: NEGATIVE

## 2023-10-30 MED ORDER — BUTORPHANOL TARTRATE 2 MG/ML IJ SOLN
2.0000 mg | Freq: Once | INTRAMUSCULAR | Status: AC
  Administered 2023-10-30: 2 mg via INTRAVENOUS
  Filled 2023-10-30 (×2): qty 1

## 2023-10-30 MED ORDER — FLUCONAZOLE 150 MG PO TABS: 150.0000 mg | ORAL_TABLET | Freq: Once | ORAL | 0 refills | Status: AC

## 2023-10-30 MED ORDER — ONDANSETRON 4 MG PO TBDP
8.0000 mg | ORAL_TABLET | Freq: Once | ORAL | Status: AC
  Administered 2023-10-30: 8 mg via ORAL
  Filled 2023-10-30: qty 2

## 2023-10-30 MED ORDER — ONDANSETRON 4 MG PO TBDP: 4.0000 mg | ORAL_TABLET | Freq: Four times a day (QID) | ORAL | 0 refills | Status: DC | PRN

## 2023-10-30 NOTE — Discharge Instructions (Signed)
 Reasons to return to MAU if you are before 37 weeks:  1.  Contractions are  10-15 minutes apart or less, each last longer than 30 seconds, these have been going on for 1-2 hours 2.  You have a large gush of fluid, or a trickle of fluid that will not stop and you have to wear a pad 3.  You have bleeding that is bright red, heavier than spotting--like menstrual bleeding (spotting can be normal in early labor or after a check of your cervix) 4.  You do not feel the baby moving like he/she normally does

## 2023-10-30 NOTE — MAU Note (Signed)
 Medicated pt with stadol for pain from  contractions.  About 5 min after she was medicated pt c/o feeling hot and the room spining. Told her it was a side effect from the medication and it would go away in a little bit. Tole her to try to lay back and rest and relax. Gave her a fan and cool rag to help cool her down.

## 2023-11-01 LAB — CERVICOVAGINAL ANCILLARY ONLY
Chlamydia: NEGATIVE
Comment: NEGATIVE
Comment: NEGATIVE
Comment: NORMAL
Neisseria Gonorrhea: NEGATIVE
Trichomonas: NEGATIVE

## 2023-11-01 LAB — CULTURE, BETA STREP (GROUP B ONLY): Strep Gp B Culture: NEGATIVE

## 2023-11-03 ENCOUNTER — Other Ambulatory Visit: Payer: Self-pay | Admitting: *Deleted

## 2023-11-03 DIAGNOSIS — O360930 Maternal care for other rhesus isoimmunization, third trimester, not applicable or unspecified: Secondary | ICD-10-CM

## 2023-11-05 ENCOUNTER — Inpatient Hospital Stay (HOSPITAL_COMMUNITY): Admitting: Anesthesiology

## 2023-11-05 ENCOUNTER — Inpatient Hospital Stay (HOSPITAL_COMMUNITY)
Admission: AD | Admit: 2023-11-05 | Discharge: 2023-11-07 | DRG: 806 | Disposition: A | Discharge status: Home / Self Care | Attending: Obstetrics and Gynecology | Admitting: Obstetrics and Gynecology

## 2023-11-05 ENCOUNTER — Ambulatory Visit (HOSPITAL_BASED_OUTPATIENT_CLINIC_OR_DEPARTMENT_OTHER): Admitting: Obstetrics and Gynecology

## 2023-11-05 ENCOUNTER — Encounter (HOSPITAL_COMMUNITY): Payer: Self-pay | Admitting: Obstetrics and Gynecology

## 2023-11-05 ENCOUNTER — Encounter: Payer: Self-pay | Admitting: Obstetrics & Gynecology

## 2023-11-05 ENCOUNTER — Ambulatory Visit

## 2023-11-05 VITALS — BP 119/74 | HR 67

## 2023-11-05 DIAGNOSIS — O99213 Obesity complicating pregnancy, third trimester: Secondary | ICD-10-CM | POA: Diagnosis not present

## 2023-11-05 DIAGNOSIS — O9962 Diseases of the digestive system complicating childbirth: Secondary | ICD-10-CM | POA: Diagnosis not present

## 2023-11-05 DIAGNOSIS — R531 Weakness: Secondary | ICD-10-CM | POA: Diagnosis not present

## 2023-11-05 DIAGNOSIS — Z3182 Encounter for Rh incompatibility status: Secondary | ICD-10-CM | POA: Insufficient documentation

## 2023-11-05 DIAGNOSIS — Z3490 Encounter for supervision of normal pregnancy, unspecified, unspecified trimester: Secondary | ICD-10-CM

## 2023-11-05 DIAGNOSIS — Z6791 Unspecified blood type, Rh negative: Secondary | ICD-10-CM | POA: Diagnosis not present

## 2023-11-05 DIAGNOSIS — A6000 Herpesviral infection of urogenital system, unspecified: Secondary | ICD-10-CM | POA: Diagnosis present

## 2023-11-05 DIAGNOSIS — O26893 Other specified pregnancy related conditions, third trimester: Secondary | ICD-10-CM | POA: Diagnosis present

## 2023-11-05 DIAGNOSIS — M62830 Muscle spasm of back: Secondary | ICD-10-CM | POA: Diagnosis not present

## 2023-11-05 DIAGNOSIS — E669 Obesity, unspecified: Secondary | ICD-10-CM | POA: Diagnosis not present

## 2023-11-05 DIAGNOSIS — R202 Paresthesia of skin: Secondary | ICD-10-CM | POA: Diagnosis not present

## 2023-11-05 DIAGNOSIS — O09293 Supervision of pregnancy with other poor reproductive or obstetric history, third trimester: Secondary | ICD-10-CM

## 2023-11-05 DIAGNOSIS — Z3A36 36 weeks gestation of pregnancy: Secondary | ICD-10-CM

## 2023-11-05 DIAGNOSIS — O360930 Maternal care for other rhesus isoimmunization, third trimester, not applicable or unspecified: Secondary | ICD-10-CM | POA: Diagnosis not present

## 2023-11-05 DIAGNOSIS — O9832 Other infections with a predominantly sexual mode of transmission complicating childbirth: Secondary | ICD-10-CM | POA: Diagnosis present

## 2023-11-05 DIAGNOSIS — O0990 Supervision of high risk pregnancy, unspecified, unspecified trimester: Secondary | ICD-10-CM

## 2023-11-05 DIAGNOSIS — O4103X0 Oligohydramnios, third trimester, not applicable or unspecified: Secondary | ICD-10-CM

## 2023-11-05 DIAGNOSIS — K219 Gastro-esophageal reflux disease without esophagitis: Secondary | ICD-10-CM | POA: Diagnosis not present

## 2023-11-05 DIAGNOSIS — O360130 Maternal care for anti-D [Rh] antibodies, third trimester, not applicable or unspecified: Secondary | ICD-10-CM | POA: Diagnosis not present

## 2023-11-05 DIAGNOSIS — O09899 Supervision of other high risk pregnancies, unspecified trimester: Secondary | ICD-10-CM

## 2023-11-05 DIAGNOSIS — Z3A37 37 weeks gestation of pregnancy: Secondary | ICD-10-CM | POA: Diagnosis not present

## 2023-11-05 DIAGNOSIS — Z91013 Allergy to seafood: Secondary | ICD-10-CM | POA: Diagnosis not present

## 2023-11-05 DIAGNOSIS — O9902 Anemia complicating childbirth: Secondary | ICD-10-CM | POA: Diagnosis present

## 2023-11-05 DIAGNOSIS — Z880 Allergy status to penicillin: Secondary | ICD-10-CM | POA: Diagnosis not present

## 2023-11-05 LAB — CBC
HCT: 34.4 % — ABNORMAL LOW (ref 36.0–46.0)
Hemoglobin: 11.6 g/dL — ABNORMAL LOW (ref 12.0–15.0)
MCH: 33.9 pg (ref 26.0–34.0)
MCHC: 33.7 g/dL (ref 30.0–36.0)
MCV: 100.6 fL — ABNORMAL HIGH (ref 80.0–100.0)
Platelets: 198 10*3/uL (ref 150–400)
RBC: 3.42 MIL/uL — ABNORMAL LOW (ref 3.87–5.11)
RDW: 12.4 % (ref 11.5–15.5)
WBC: 6.1 10*3/uL (ref 4.0–10.5)
nRBC: 0 % (ref 0.0–0.2)

## 2023-11-05 LAB — TYPE AND SCREEN
ABO/RH(D): B NEG
Antibody Screen: POSITIVE

## 2023-11-05 MED ORDER — PHENYLEPHRINE 80 MCG/ML (10ML) SYRINGE FOR IV PUSH (FOR BLOOD PRESSURE SUPPORT): 80.0000 ug | PREFILLED_SYRINGE | INTRAVENOUS | Status: DC | PRN

## 2023-11-05 MED ORDER — OXYCODONE-ACETAMINOPHEN 5-325 MG PO TABS: 1.0000 | ORAL_TABLET | ORAL | Status: DC | PRN

## 2023-11-05 MED ORDER — LACTATED RINGERS IV SOLN: INTRAVENOUS | Status: DC

## 2023-11-05 MED ORDER — EPHEDRINE 5 MG/ML INJ: 10.0000 mg | INTRAVENOUS | Status: DC | PRN

## 2023-11-05 MED ORDER — FENTANYL-BUPIVACAINE-NACL 0.5-0.125-0.9 MG/250ML-% EP SOLN
EPIDURAL | Status: AC
  Filled 2023-11-05: qty 250

## 2023-11-05 MED ORDER — LACTATED RINGERS IV SOLN
500.0000 mL | Freq: Once | INTRAVENOUS | Status: DC
Start: 2023-11-05 — End: 2023-11-06

## 2023-11-05 MED ORDER — LIDOCAINE HCL (PF) 1 % IJ SOLN: 30.0000 mL | INTRAMUSCULAR | Status: DC | PRN

## 2023-11-05 MED ORDER — FLEET ENEMA RE ENEM: 1.0000 | ENEMA | RECTAL | Status: DC | PRN

## 2023-11-05 MED ORDER — MISOPROSTOL 25 MCG QUARTER TABLET
25.0000 ug | ORAL_TABLET | ORAL | Status: DC | PRN
  Administered 2023-11-05 (×2): 25 ug via VAGINAL
  Filled 2023-11-05 (×2): qty 1

## 2023-11-05 MED ORDER — TERBUTALINE SULFATE 1 MG/ML IJ SOLN: 0.2500 mg | Freq: Once | INTRAMUSCULAR | Status: DC | PRN

## 2023-11-05 MED ORDER — FENTANYL-BUPIVACAINE-NACL 0.5-0.125-0.9 MG/250ML-% EP SOLN
12.0000 mL/h | EPIDURAL | Status: DC | PRN
  Administered 2023-11-05: 12 mL/h via EPIDURAL

## 2023-11-05 MED ORDER — OXYTOCIN BOLUS FROM INFUSION
333.0000 mL | Freq: Once | INTRAVENOUS | Status: AC
  Administered 2023-11-06: 333 mL via INTRAVENOUS

## 2023-11-05 MED ORDER — OXYCODONE-ACETAMINOPHEN 5-325 MG PO TABS: 2.0000 | ORAL_TABLET | ORAL | Status: DC | PRN

## 2023-11-05 MED ORDER — DIPHENHYDRAMINE HCL 50 MG/ML IJ SOLN: 12.5000 mg | INTRAMUSCULAR | Status: DC | PRN

## 2023-11-05 MED ORDER — LACTATED RINGERS IV SOLN
500.0000 mL | INTRAVENOUS | Status: DC | PRN
  Administered 2023-11-05: 1000 mL via INTRAVENOUS

## 2023-11-05 MED ORDER — SOD CITRATE-CITRIC ACID 500-334 MG/5ML PO SOLN: 30.0000 mL | ORAL | Status: DC | PRN

## 2023-11-05 MED ORDER — LIDOCAINE HCL (PF) 1 % IJ SOLN
INTRAMUSCULAR | Status: DC | PRN
Start: 2023-11-05 — End: 2023-11-06
  Administered 2023-11-05 (×2): 4 mL via EPIDURAL

## 2023-11-05 MED ORDER — ACETAMINOPHEN 325 MG PO TABS: 650.0000 mg | ORAL_TABLET | ORAL | Status: DC | PRN

## 2023-11-05 MED ORDER — ONDANSETRON HCL 4 MG/2ML IJ SOLN
4.0000 mg | Freq: Four times a day (QID) | INTRAMUSCULAR | Status: DC | PRN
  Administered 2023-11-05: 4 mg via INTRAVENOUS
  Filled 2023-11-05: qty 2

## 2023-11-05 MED ORDER — OXYTOCIN-SODIUM CHLORIDE 30-0.9 UT/500ML-% IV SOLN
2.5000 [IU]/h | INTRAVENOUS | Status: DC
  Administered 2023-11-06: 2.5 [IU]/h via INTRAVENOUS
  Filled 2023-11-05: qty 500

## 2023-11-05 NOTE — Progress Notes (Signed)
  Maternal-Fetal Medicine Consultation Name: Brandy [name] MRN: [id number]  G4 P0103 at 36w 6d gestation. RHD isoimmunization.  Anti-D antibodies were too weak to titer.  Patient has been having MCA Dopplers every week. She was evaluated at the MAU last week for complaints of leakage of amniotic fluid.  Fern test was negative, and the patient was discharged home. Ultrasound Amniotic fluid is decreased.  A single vertical pocket of 2 cm was not reproducible.  Oligohydramnios is seen.  Cephalic presentation.  Middle cerebral artery Doppler showed normal peak systolic velocity measurements (no evidence of fetal anemia).  Antenatal testing is reassuring.  BPP 8/8.  I explained the finding of oligohydramnios that this is associated with an increased risk of perinatal mortality and morbidity.  I have recommended delivery now. Patient 3 vaginal deliveries around [redacted] weeks gestation. I reassured the patient of normal MCA Doppler study.  Recommendations -Patient to go over to L&D for delivery. -NICU to be informed because of history of jaundice in all her previous children.  Consultation including face-to-face (more than 50%) counseling 10 minutes.

## 2023-11-05 NOTE — H&P (Addendum)
 OBSTETRIC ADMISSION HISTORY AND PHYSICAL  Brandy [name] is a 28 y.o. female [phone] with IUP at [redacted]w[redacted]d by LMP presenting for oligohydramnios. Was evaluated at MAU last week with complaints of leakage of fluids and discharged with negative fern test. Today [place] with decreased amniotic fluid and a single pocket of 2 cm was not reproducible. Dopplers showed normal peak systolic velocity. BPP 8/8. She reports +FMs, No LOF, no VB, no blurry vision, headaches or peripheral edema, and RUQ pain.  She plans on bottle feeding. She request depo for birth control. 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  #Rh negative  #HSV positive #Hx preterm delivery  #Low B12  #Low vitamin D   #Elevated MCV  Past Medical History: Past Medical History:  Diagnosis Date   Chlamydia    Heart murmur    Diagnosed as a child   Migraine     Past Surgical History: Past Surgical History:  Procedure Laterality Date   NO PAST SURGERIES      Obstetrical History: OB History     Gravida  4   Para  3   Term  0   Preterm  3   AB  0   Living  3      SAB  0   IAB  0   Ectopic  0   Multiple  0   Live Births  3           Social History Social History   Socioeconomic History   Marital status: Single    Spouse name: Not on file   Number of children: Not on file   Years of education: Not on file   Highest education level: Not on file  Occupational History   Not on file  Tobacco Use   Smoking status: Never   Smokeless tobacco: Never  Vaping Use   Vaping status: Never Used  Substance and Sexual Activity   Alcohol use: Not Currently   Drug use: No   Sexual activity: Not Currently    Birth control/protection: None  Other Topics Concern   Not on file  Social History Narrative    Not on file   Social Drivers of Health   Financial Resource Strain: Not at Risk (03/24/2023)   Received from [employer]    Financial Resource Strain: 1  Food Insecurity: Not at Risk (03/24/2023)   Received from [facility] Insecurity    Food: 1  Transportation Needs: Not at Risk (03/24/2023)   Received from [employer] Needs    Transportation: 1  Physical Activity: Not on File (02/03/2023)   Received from [facility]   Physical Activity    Physical Activity: 0  Stress: Not on File (02/03/2023)   Received from [facility]   Stress    Stress: 0  Social Connections: Not on File (04/06/2023)   Received from [employer]   Social Connections    Connectedness: 0    Family History: Family History  Problem Relation Age of Onset   Asthma Mother    Cancer Neg Hx    Diabetes Neg Hx    Heart disease Neg Hx    Hypertension Neg Hx     Allergies: Allergies  Allergen Reactions   Kiwi Extract Swelling   Metronidazole  And Related Itching   Penicillins Hives and Other (See Comments)    Has patient had a PCN reaction causing immediate rash, facial/tongue/throat swelling, SOB or lightheadedness with hypotension: Yes Has patient had a PCN reaction causing severe rash involving mucus membranes or skin necrosis: No Has patient had a PCN reaction that required hospitalization No Has patient had a PCN reaction occurring within the last 10 years: No If all of the above answers are "NO", then may proceed with Cephalosporin use.    Lobster [Shellfish Allergy] Rash   Reglan [Metoclopramide] Diarrhea    Medications Prior to Admission  Medication Sig Dispense Refill Last Dose/Taking   acetaminophen (TYLENOL) 500 MG tablet Take 500 mg by mouth every 6 (six) hours as needed.      Cholecalciferol (VITAMIN D-3) 125 MCG (5000 UT) TABS Take 1 tablet by mouth daily. 60 tablet 1    cyanocobalamin (VITAMIN B12) 1000 MCG tablet Take 1 tablet (1,000 mcg total) by mouth daily. 42 tablet 0     cyclobenzaprine (FLEXERIL) 5 MG tablet Take 1 tablet (5 mg total) by mouth 3 (three) times daily as needed for muscle spasms. 30 tablet 0    ondansetron (ZOFRAN-ODT) 4 MG disintegrating tablet Take 1 tablet (4 mg total) by mouth every 8 (eight) hours as needed for nausea or vomiting. 15 tablet 1    ondansetron (ZOFRAN-ODT) 4 MG disintegrating tablet Take 1 tablet (4 mg total) by mouth every 6 (six) hours as needed for nausea. 20 tablet 0    pantoprazole (PROTONIX) 20 MG tablet Take 1 tablet (20 mg total) by mouth daily. 30 tablet 3    Prenatal MV & Min w/FA-DHA (PRENATAL GUMMIES) 0.18-25 MG CHEW Chew by mouth.      terconazole (TERAZOL 7) 0.4 % vaginal cream Place 1 applicator vaginally at bedtime. Use for seven days 45 g 0    valACYclovir (VALTREX) 500 MG tablet Take 1 tablet (500 mg total) by mouth 2 (two) times daily. 60 tablet 6      Review of Systems   All systems reviewed and negative except as stated in HPI  Height 5\' 4"  (1.626 m), weight 77.1 kg, last menstrual period 02/20/2023. General appearance: alert, cooperative, appears stated age, and no distress Lungs: clear to auscultation bilaterally Heart: regular rate and rhythm Abdomen: soft, non-tender; bowel sounds normal Pelvic: see cervical exam  Extremities: Homans sign is negative, no sign of DVT Presentation: cephalic Fetal monitoringBaseline: 135  bpm; moderate variability; positive accelerations   Uterine activityFrequency: Every 3-4 minutes    Prenatal labs: ABO, Rh: --/--/B NEG (11/30 1851) Antibody: Positive, See Final Results (01/27 0844) Rubella: 2.45 (10/01 1011) RPR: Non Reactive (01/27 0844)  HBsAg: Negative (10/01 1011)  HIV: Non Reactive (01/27 0844)  GBS: Negative/-- (03/27 1500)    Lab Results  Component Value Date   GBS Negative 10/28/2023   GTT WNL Genetic screening  LR F Anatomy US WNL  Immunization History  Administered Date(s) Administered   Influenza, Seasonal, Injecte, Preservative Fre  06/03/2023   Influenza,inj,Quad PF,6+ Mos 09/28/2016   Rho (D) Immune Globulin 11/23/2016   Tdap 04/18/2015, 11/04/2016, 08/30/2023    Prenatal Transfer Tool  Maternal Diabetes: No Genetic Screening: Normal Maternal Ultrasounds/Referrals: Normal Fetal Ultrasounds or other Referrals:  Referred to Materal Fetal Medicine  Maternal Substance Abuse:  No Significant Maternal Medications:  Meds include: Other: valtrex Significant Maternal Lab Results: Group B Strep negative and Rh negative Number of Prenatal Visits:greater than 3 verified prenatal visits Maternal Vaccinations:TDap  and Flu Other Comments:  None   No results found for this or any previous visit (from the past 24 hours).  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 [id number] at [redacted]w[redacted]d here for IOL for oligohydramnios. Pt with alloimmunization and Rh negative with sig jaundice in prior pregnancies. Has had weekly MCA dopplers. Today AFI 4.9 with BPP 8/8.   #Labor:Not in active labor. Start with single vaginal cytotec and recheck in 4 hours.  #Pain: Well controlled. Would like an epidural eventually.  #FWB: Reactive  #GBS status:  negative #Feeding: Formula #Reproductive Life planning: Depo Provera #Circ:  not applicable  #HSV: last outbreak 7 years ago, compliant with Valtrex  Denton Ar, MD  11/05/2023, 12:43 PM  GME ATTESTATION:  Evaluation and management procedures were  performed by the [facility] Medicine Resident under my supervision. I was immediately available for direct supervision, assistance and direction throughout this encounter.  I also confirm that I have verified the information documented in the resident's note. I have also made any necessary editorial changes.  Wyn Forster, MD OB Fellow, Faculty Practice [facility], Center for [facility] 11/05/2023 2:20 PM

## 2023-11-05 NOTE — Progress Notes (Signed)
 OB/GYN Faculty Practice: Labor Progress Note  Subjective: Resting comfortably  Objective: BP 102/62   Pulse 70   Temp 98.1 F (36.7 C) (Oral)   Resp 16   Ht 5\' 4"  (1.626 m)   Wt 77.1 kg   LMP 02/20/2023   BMI 29.18 kg/m   Gen: no acute distress FHT: 125bpm, moderate variability, +accels no decels Toco: q 3-4 min SVE deferred- last exam per RN  Dilation: Fingertip Effacement (%): 50 Cervical Position: Posterior Station: -3 Presentation: Vertex Exam by:: Goodnight,RN  Assessment and Plan: 28 y.o. [id number] [redacted]w[redacted]d for IOL due to oligohydramnios  Labor: cytotec #2 placed -- pain control: IV or epidural upon request  Fetal Well-Being:  -- Category I - continuous fetal monitoring  -- GBS (neg)    Myna Hidalgo, DO Attending Obstetrician & Gynecologist, Faculty Practice Center for [employer], [facility]

## 2023-11-05 NOTE — Progress Notes (Signed)
 LABOR PROGRESS NOTE  Brandy [name] is a 28 y.o. [id number] at [redacted]w[redacted]d  admitted for IOL for oligohydramnios  Subjective: More uncomfortable at this time, feeling more pressure.  Objective: BP 114/69   Pulse 62   Temp 98.1 F (36.7 C) (Oral)   Resp 15   Ht 5\' 4"  (1.626 m)   Wt 77.1 kg   LMP 02/20/2023   SpO2 100%   BMI 29.18 kg/m  or  Vitals:   11/05/23 2230 11/05/23 2231 11/05/23 2235 11/05/23 2302  BP:  (!) 87/42  114/69  Pulse:  (!) 57  62  Resp:    15  Temp:    98.1 F (36.7 C)  TempSrc:    Oral  SpO2: 100%  100%   Weight:      Height:        FHT: 130/mod/+a/+early  Labs: Lab Results  Component Value Date   WBC 6.1 11/05/2023   HGB 11.6 (L) 11/05/2023   HCT 34.4 (L) 11/05/2023   MCV 100.6 (H) 11/05/2023   PLT 198 11/05/2023     Assessment / Plan: 28 y.o. [id number] at [redacted]w[redacted]d here for IOL for oligohydramnios.  Labor: FB now out, cx 4cm and thin  offered AROM and pt provided verbal consent, AROM performed w moderate amount of clear fluid.  Fetal wellbeing: Cat I Pain control: Epidural GBS status: Negative  Brandy Aland, MD OB Fellow, Faculty Practice [facility], Center for [facility]

## 2023-11-05 NOTE — Anesthesia Procedure Notes (Signed)
 Epidural Patient location during procedure: OB Start time: 11/05/2023 7:38 PM End time: 11/05/2023 7:43 PM  Staffing Anesthesiologist: Linton Rump, MD Performed: anesthesiologist   Preanesthetic Checklist Completed: patient identified, IV checked, site marked, risks and benefits discussed, surgical consent, monitors and equipment checked, pre-op evaluation and timeout performed  Epidural Patient position: sitting Prep: DuraPrep and site prepped and draped Patient monitoring: continuous pulse ox and blood pressure Approach: midline Location: L3-L4 Injection technique: LOR saline  Needle:  Needle type: Tuohy  Needle gauge: 17 G Needle length: 9 cm and 9 Needle insertion depth: 5.5 cm Catheter type: closed end flexible Catheter size: 19 Gauge Catheter at skin depth: 10.5 cm Test dose: negative  Assessment Events: blood not aspirated, no cerebrospinal fluid, injection not painful, no injection resistance, no paresthesia and negative IV test  Additional Notes The patient has requested an epidural for labor pain management. Risks and benefits including, but not limited to, infection, bleeding, local anesthetic toxicity, headache, hypotension, back pain, block failure, etc. were discussed with the patient. The patient expressed understanding and consented to the procedure. I confirmed that the patient has no bleeding disorders and is not taking blood thinners. I confirmed the patient's last platelet count with the nurse. A time-out was performed immediately prior to the procedure. Please see nursing documentation for vital signs. Sterile technique was used throughout the whole procedure. Once LOR achieved, the epidural catheter threaded easily without resistance. Aspiration of the catheter was negative for blood and CSF. The epidural was dosed slowly and an infusion was started.  1 attempt(s)Reason for block:procedure for pain

## 2023-11-05 NOTE — Anesthesia Preprocedure Evaluation (Signed)
 Anesthesia Evaluation  Patient identified by MRN, date of birth, ID band Patient awake    Reviewed: Allergy & Precautions, NPO status , Patient's Chart, lab work & pertinent test results  History of Anesthesia Complications Negative for: history of anesthetic complications  Airway Mallampati: III   Neck ROM: Full    Dental   Pulmonary neg pulmonary ROS   Pulmonary exam normal breath sounds clear to auscultation       Cardiovascular (-) hypertension+ Valvular Problems/Murmurs  Rhythm:Regular Rate:Normal     Neuro/Psych  Headaches    GI/Hepatic ,GERD  ,,  Endo/Other  negative endocrine ROS    Renal/GU      Musculoskeletal   Abdominal   Peds  Hematology  (+) Blood dyscrasia, anemia Lab Results      Component                Value               Date                      WBC                      6.1                 11/05/2023                HGB                      11.6 (L)            11/05/2023                HCT                      34.4 (L)            11/05/2023                MCV                      100.6 (H)           11/05/2023                PLT                      198                 11/05/2023              Anesthesia Other Findings   Reproductive/Obstetrics (+) Pregnancy                             Anesthesia Physical Anesthesia Plan  ASA: 2  Anesthesia Plan: Epidural   Post-op Pain Management:    Induction:   PONV Risk Score and Plan:   Airway Management Planned: Natural Airway  Additional Equipment:   Intra-op Plan:   Post-operative Plan:   Informed Consent: I have reviewed the patients History and Physical, chart, labs and discussed the procedure including the risks, benefits and alternatives for the proposed anesthesia with the patient or authorized representative who has indicated his/her understanding and acceptance.       Plan Discussed with:  Anesthesiologist  Anesthesia Plan Comments: (I have discussed risks of neuraxial anesthesia including but not limited to infection, bleeding,  nerve injury, back pain, headache, seizures, and failure of block. Patient denies bleeding disorders and is not currently anticoagulated. Labs have been reviewed. Risks and benefits discussed. All patient's questions answered.  )       Anesthesia Quick Evaluation

## 2023-11-05 NOTE — Progress Notes (Signed)
 LABOR PROGRESS NOTE  Brandy [name] is a 28 y.o. [phone] at [redacted]w[redacted]d  admitted for IOL for oligohydramnios  Subjective: Resting comfortably s/p epidural  Objective: BP 123/75   Pulse 65   Temp 98.1 F (36.7 C) (Oral)   Resp 15   Ht 5\' 4"  (1.626 m)   Wt 77.1 kg   LMP 02/20/2023   SpO2 100%   BMI 29.18 kg/m  or  Vitals:   11/05/23 2010 11/05/23 2011 11/05/23 2015 11/05/23 2016  BP:  112/71  123/75  Pulse:  67  65  Resp:      Temp:      TempSrc:      SpO2: 100%  100%   Weight:      Height:        FHT: baseline rate 130, moderate varibility, +accels, nodecel Toco: irregular SVE: 1.5/50/-2, Cook placed without difficulty-40cc  Labs: Lab Results  Component Value Date   WBC 6.1 11/05/2023   HGB 11.6 (L) 11/05/2023   HCT 34.4 (L) 11/05/2023   MCV 100.6 (H) 11/05/2023   PLT 198 11/05/2023     Assessment / Plan: Brandy [name] at [redacted]w[redacted]d here for IOL  Labor: s/p cytotec #2 around 1740,Cook placed, plan to start Pit around 2140 Fetal Wellbeing:  Cat. I Pain Control:  epidural in place GBS status: neg  Brandy Hidalgo, DO Attending Obstetrician & Gynecologist, Faculty Practice Center for [employer], [facility]

## 2023-11-06 ENCOUNTER — Encounter (HOSPITAL_COMMUNITY): Payer: Self-pay | Admitting: Obstetrics and Gynecology

## 2023-11-06 DIAGNOSIS — O0990 Supervision of high risk pregnancy, unspecified, unspecified trimester: Secondary | ICD-10-CM | POA: Diagnosis not present

## 2023-11-06 LAB — RPR: RPR Ser Ql: NONREACTIVE

## 2023-11-06 MED ORDER — SODIUM CHLORIDE 0.9% FLUSH: 3.0000 mL | INTRAVENOUS | Status: DC | PRN

## 2023-11-06 MED ORDER — SODIUM CHLORIDE 0.9 % IV SOLN: 250.0000 mL | INTRAVENOUS | Status: DC | PRN

## 2023-11-06 MED ORDER — ONDANSETRON HCL 4 MG/2ML IJ SOLN
4.0000 mg | INTRAMUSCULAR | Status: DC | PRN
  Administered 2023-11-07: 4 mg via INTRAVENOUS
  Filled 2023-11-06: qty 2

## 2023-11-06 MED ORDER — SODIUM CHLORIDE 0.9% FLUSH
3.0000 mL | Freq: Two times a day (BID) | INTRAVENOUS | Status: DC
  Administered 2023-11-06 (×2): 3 mL via INTRAVENOUS

## 2023-11-06 MED ORDER — ACETAMINOPHEN 325 MG PO TABS
650.0000 mg | ORAL_TABLET | ORAL | Status: DC | PRN
  Administered 2023-11-06 – 2023-11-07 (×3): 650 mg via ORAL
  Filled 2023-11-06 (×3): qty 2

## 2023-11-06 MED ORDER — SIMETHICONE 80 MG PO CHEW: 80.0000 mg | CHEWABLE_TABLET | ORAL | Status: DC | PRN

## 2023-11-06 MED ORDER — PRENATAL MULTIVITAMIN CH
1.0000 | ORAL_TABLET | Freq: Every day | ORAL | Status: DC
  Administered 2023-11-06 – 2023-11-07 (×2): 1 via ORAL
  Filled 2023-11-06 (×2): qty 1

## 2023-11-06 MED ORDER — MEDROXYPROGESTERONE ACETATE 150 MG/ML IM SUSP: 150.0000 mg | INTRAMUSCULAR | Status: DC | PRN

## 2023-11-06 MED ORDER — WITCH HAZEL-GLYCERIN EX PADS: 1.0000 | MEDICATED_PAD | CUTANEOUS | Status: DC | PRN

## 2023-11-06 MED ORDER — OXYCODONE HCL 5 MG PO TABS
5.0000 mg | ORAL_TABLET | ORAL | Status: DC | PRN
  Administered 2023-11-06 (×2): 5 mg via ORAL
  Filled 2023-11-06 (×2): qty 1

## 2023-11-06 MED ORDER — RHO D IMMUNE GLOBULIN 1500 UNIT/2ML IJ SOSY
300.0000 ug | PREFILLED_SYRINGE | Freq: Once | INTRAMUSCULAR | Status: AC
  Administered 2023-11-06: 300 ug via INTRAMUSCULAR
  Filled 2023-11-06: qty 2

## 2023-11-06 MED ORDER — IBUPROFEN 800 MG PO TABS
800.0000 mg | ORAL_TABLET | Freq: Three times a day (TID) | ORAL | Status: DC
  Administered 2023-11-06 – 2023-11-07 (×5): 800 mg via ORAL
  Filled 2023-11-06 (×6): qty 1

## 2023-11-06 MED ORDER — SENNOSIDES-DOCUSATE SODIUM 8.6-50 MG PO TABS
2.0000 | ORAL_TABLET | ORAL | Status: DC
  Administered 2023-11-06 – 2023-11-07 (×2): 2 via ORAL
  Filled 2023-11-06 (×2): qty 2

## 2023-11-06 MED ORDER — OXYCODONE HCL 5 MG PO TABS
10.0000 mg | ORAL_TABLET | ORAL | Status: DC | PRN
  Administered 2023-11-07 (×2): 10 mg via ORAL
  Filled 2023-11-06 (×2): qty 2

## 2023-11-06 MED ORDER — ONDANSETRON HCL 4 MG PO TABS: 4.0000 mg | ORAL_TABLET | ORAL | Status: DC | PRN

## 2023-11-06 MED ORDER — DIBUCAINE (PERIANAL) 1 % EX OINT: 1.0000 | TOPICAL_OINTMENT | CUTANEOUS | Status: DC | PRN

## 2023-11-06 MED ORDER — BENZOCAINE-MENTHOL 20-0.5 % EX AERO: 1.0000 | INHALATION_SPRAY | CUTANEOUS | Status: DC | PRN

## 2023-11-06 MED ORDER — COCONUT OIL OIL: 1.0000 | TOPICAL_OIL | Status: DC | PRN

## 2023-11-06 MED ORDER — DIPHENHYDRAMINE HCL 25 MG PO CAPS: 25.0000 mg | ORAL_CAPSULE | Freq: Four times a day (QID) | ORAL | Status: DC | PRN

## 2023-11-06 NOTE — Anesthesia Postprocedure Evaluation (Signed)
 Anesthesia Post Note  Patient: Brandy [name]  Procedure(s) Performed: AN AD HOC LABOR EPIDURAL     Patient location during evaluation: Mother Baby Anesthesia Type: Epidural Level of consciousness: awake, oriented and awake and alert Pain management: pain level controlled Vital Signs Assessment: post-procedure vital signs reviewed and stable Respiratory status: spontaneous breathing, respiratory function stable and nonlabored ventilation Cardiovascular status: stable Postop Assessment: no headache, adequate PO intake, patient able to bend at knees and no apparent nausea or vomiting (Patient hasn't walked yet but can bend legs easily.) Anesthetic complications: no   No notable events documented.  Last Vitals:  Vitals:   11/06/23 0333 11/06/23 0830  BP: 116/69 116/66  Pulse: 76 68  Resp: 16 18  Temp:  36.7 C  SpO2: 100% 99%    Last Pain:  Vitals:   11/06/23 0837  TempSrc:   PainSc: 8    Pain Goal:                   [name]

## 2023-11-06 NOTE — Anesthesia Postprocedure Evaluation (Signed)
 Anesthesia Post Note  Patient: Brandy [name]  Procedure(s) Performed: AN AD HOC LABOR EPIDURAL     Patient location during evaluation: Mother Baby Anesthesia Type: Epidural Level of consciousness: awake, oriented and awake and alert Pain management: pain level controlled Vital Signs Assessment: post-procedure vital signs reviewed and stable Respiratory status: spontaneous breathing, respiratory function stable and nonlabored ventilation Cardiovascular status: stable Postop Assessment: no headache, adequate PO intake, able to ambulate, patient able to bend at knees and no apparent nausea or vomiting Anesthetic complications: no   No notable events documented.  Last Vitals:  Vitals:   11/06/23 0333 11/06/23 0830  BP: 116/69 116/66  Pulse: 76 68  Resp: 16 18  Temp:  36.7 C  SpO2: 100% 99%    Last Pain:  Vitals:   11/06/23 0837  TempSrc:   PainSc: 8    Pain Goal:                   Hazim Treadway

## 2023-11-06 NOTE — Discharge Summary (Signed)
 Postpartum Discharge Summary  Date of Service updated***     Patient Name: Brandy [name] DOB: [birth year] MRN: [id number]  Date of admission: 11/05/2023 Delivery date:11/05/2023 Delivering provider: [name] Date of discharge: 11/06/2023  Admitting diagnosis: Pregnancy [Z34.90] Intrauterine pregnancy: [redacted]w[redacted]d     Secondary diagnosis:  Principal Problem:   SVD (spontaneous vaginal delivery) Active Problems:   Pregnancy  Additional problems: ***    Discharge diagnosis: Preterm Pregnancy Delivered and Rh negative status, oligohydramnios                                               Post partum procedures:{Postpartum procedures:23558} Augmentation: AROM, Cytotec, and IP Foley Complications: {OB Labor/Delivery Complications:20784}  Hospital course: Induction of Labor With Vaginal Delivery   28 y.o. yo [phone] at [redacted]w[redacted]d was admitted to the hospital 11/05/2023 for induction of labor.  Indication for induction:  oligohydramnios .  Patient had an labor course complicated by nothing Membrane Rupture Time/Date: 10:58 PM,11/05/2023  Delivery Method:Vaginal, Spontaneous Operative Delivery:N/A Episiotomy: None Lacerations:  None Details of delivery can be found in separate delivery note.  Patient had a postpartum course complicated by***. Patient is discharged home 11/06/23.  Newborn Data: Birth date:11/05/2023 Birth time:11:58 PM Gender:Female Living status:Living Apgars:9 ,9  Weight:   Magnesium Sulfate received: {Mag received:30440022} BMZ received: No Rhophylac:{Rhophylac received:30440032} MMR:N/A T-DaP:Given prenatally Flu: Yes RSV Vaccine received: No Transfusion:{Transfusion received:30440034}  Immunizations received: Immunization History  Administered Date(s) Administered   Influenza, Seasonal, Injecte, Preservative Fre 06/03/2023   Influenza,inj,Quad PF,6+ Mos 09/28/2016   Rho (D) Immune Globulin 11/23/2016   Tdap 04/18/2015, 11/04/2016, 08/30/2023    Physical  exam  Vitals:   11/05/23 2330 11/05/23 2331 11/05/23 2355 11/06/23 0001  BP:  126/69  105/89  Pulse:  (!) 122  80  Resp:  16    Temp:      TempSrc:      SpO2: 100%  100%   Weight:      Height:       General: {Exam; general:21111117} Lochia: {Desc; appropriate/inappropriate:30686::"appropriate"} Uterine Fundus: {Desc; firm/soft:30687} Incision: {Exam; incision:21111123} DVT Evaluation: {Exam; dvt:2111122} Labs: Lab Results  Component Value Date   WBC 6.1 11/05/2023   HGB 11.6 (L) 11/05/2023   HCT 34.4 (L) 11/05/2023   MCV 100.6 (H) 11/05/2023   PLT 198 11/05/2023      Latest Ref Rng & Units 07/30/2023   12:35 PM  CMP  Glucose 70 - 99 mg/dL 90   BUN 6 - 20 mg/dL 9   Creatinine 1.47 - 8.29 mg/dL 5.62   Sodium 130 - 865 mmol/L 135   Potassium 3.5 - 5.1 mmol/L 3.6   Chloride 98 - 111 mmol/L 106   CO2 22 - 32 mmol/L 21   Calcium 8.9 - 10.3 mg/dL 8.5   Total Protein 6.5 - 8.1 g/dL 5.7   Total Bilirubin <7.8 mg/dL 0.3   Alkaline Phos 38 - 126 U/L 57   AST 15 - 41 U/L 21   ALT 0 - 44 U/L 14    Edinburgh Score:     No data to display         No data recorded  After visit meds:  Allergies as of 11/06/2023       Reactions   Kiwi Extract Swelling   Metronidazole And Related Itching   Penicillins Hives, Other (See Comments)  Has patient had a PCN reaction causing immediate rash, facial/tongue/throat swelling, SOB or lightheadedness with hypotension: Yes Has patient had a PCN reaction causing severe rash involving mucus membranes or skin necrosis: No Has patient had a PCN reaction that required hospitalization No Has patient had a PCN reaction occurring within the last 10 years: No If all of the above answers are "NO", then may proceed with Cephalosporin use.   Lobster [shellfish Allergy] Rash   Reglan [metoclopramide] Diarrhea     Med Rec must be completed prior to using this SMARTLINK***        Discharge home in stable condition Infant Feeding: {Baby  feeding:23562} Infant Disposition:{CHL IP OB HOME WITH ZOXWRU:04540} Discharge instruction: per After Visit Summary and Postpartum booklet. Activity: Advance as tolerated. Pelvic rest for 6 weeks.  Diet: {OB JWJX:[id number]} Future Appointments: Future Appointments  Date Time Provider Department Center  11/11/2023  2:35 PM Warden Fillers, MD [facility]   Follow up Visit: Message sent [facility] 4/4  Please schedule this patient for a In person postpartum visit in 6 weeks with the following provider: Any provider. Additional Postpartum F/U: none   High risk pregnancy complicated by:  oligo, Rh neg status Delivery mode:  Vaginal, Spontaneous Anticipated Birth Control:  Depo   11/06/2023 [name], MD

## 2023-11-06 NOTE — Progress Notes (Addendum)
 Post Partum Day 1 Subjective:  Brandy [name] is a 28 y.o. [id number] [redacted]w[redacted]d s/p SVD on 11/05/23.  No acute events overnight.  Pt reports problems with ambulating as her left leg is still partially numb from her anesthesia and denies issues with voiding or po intake. She endorses minimal swelling in bilateral feet unchanged from pregnancy. She denies nausea or vomiting.  Pain is well controlled on Tylenol.  She has had flatus.  Lochia Small.  Plan for birth control is Depo-Provera.  Method of Feeding: Bottle  Objective: Blood pressure 116/69, pulse 76, temperature 98.8 F (37.1 C), temperature source Oral, resp. rate 16, height 5\' 4"  (1.626 m), weight 77.1 kg, last menstrual period 02/20/2023, SpO2 100%, unknown if currently breastfeeding.  Physical Exam:  General: alert, cooperative and no distress Lochia:normal flow Chest: normal WOB Heart: Regular rate Abdomen: +BS, soft, mild TTP (appropriate) Uterine Fundus: firm, below umbilicus  DVT Evaluation: No evidence of DVT seen on physical exam. Extremities: no LE edema appreciated, moderate left leg weakness and paresthesia  Recent Labs    11/05/23 1244  HGB 11.6*  HCT 34.4*    Assessment/Plan:  ASSESSMENT: Brandy [name] is a 28 y.o. [id number] [redacted]w[redacted]d s/p SVD on 11/05/23 doing well. She says her pain is minimal (3/10) and well controlled with her q6 tylenol. She does have residual numbness in her left leg with associated weakness causing her difficulty with ambulation. Generalized  back soreness with associated paraspinal muscle spasm. She was informed to keep an eye on this and let her team know if it is not improving over the next several hours.  Plan for discharge tomorrow, Circumcision prior to discharge, and Contraception Depo Rhogam ordered; Babe B positive Trial flexeril for back spasms Continue routine PP care Breastfeeding support PRN  LOS: 1 day   Elwin Mocha 11/06/2023, 7:14 AM   GME ATTESTATION:  Evaluation and  management procedures were performed by the [facility] Medicine Resident under my supervision. I was immediately available for direct supervision, assistance and direction throughout this encounter.  I also confirm that I have verified the information documented in the resident's note, and that I have also personally reperformed the pertinent components of the physical exam and all of the medical decision making activities.  I have also made any necessary editorial changes.  Wyn Forster, MD OB Fellow, Faculty Practice [facility], Center for [facility] 11/06/2023 10:55 AM

## 2023-11-07 LAB — RH IG WORKUP (INCLUDES ABO/RH)
Gestational Age(Wks): 37
Unit division: 0

## 2023-11-07 MED ORDER — MEDROXYPROGESTERONE ACETATE 150 MG/ML IM SUSP
150.0000 mg | Freq: Once | INTRAMUSCULAR | Status: AC
  Administered 2023-11-07: 150 mg via INTRAMUSCULAR
  Filled 2023-11-07: qty 1

## 2023-11-07 MED ORDER — IBUPROFEN 800 MG PO TABS: 800.0000 mg | ORAL_TABLET | Freq: Three times a day (TID) | ORAL | 0 refills | Status: DC

## 2023-11-07 MED ORDER — SENNOSIDES-DOCUSATE SODIUM 8.6-50 MG PO TABS: 2.0000 | ORAL_TABLET | ORAL | 0 refills | Status: DC

## 2023-11-09 LAB — SURGICAL PATHOLOGY

## 2023-11-11 ENCOUNTER — Ambulatory Visit

## 2023-11-11 ENCOUNTER — Other Ambulatory Visit

## 2023-11-11 ENCOUNTER — Encounter: Payer: Self-pay | Admitting: Obstetrics and Gynecology

## 2023-11-15 ENCOUNTER — Telehealth (HOSPITAL_COMMUNITY): Payer: Self-pay | Admitting: *Deleted

## 2023-11-15 NOTE — Telephone Encounter (Signed)
 11/15/2023  Name: Brandy [name] MRN: [id number] DOB: [birth year]  Reason for Call:  Transition of Care Hospital Discharge Call  Contact Status: Patient Contact Status: Complete  Language assistant needed:          Follow-Up Questions: Do You Have Any Concerns About Your Health As You Heal From Delivery?: No Do You Have Any Concerns About Your Infants Health?: No  Edinburgh Postnatal Depression Scale:  In the Past 7 Days:   Patient declined. Stated, "It's the same. I'm doing good." PHQ2-9 Depression Scale:     Discharge Follow-up: Edinburgh score requires follow up?: N/A Patient was advised of the following resources:: Breastfeeding Support Group, Support Group  Post-discharge interventions: Reviewed Newborn Safe Sleep Practices  Signature Julien Odor, RN, 11/15/23, [phone]

## 2023-12-20 ENCOUNTER — Encounter: Payer: Self-pay | Admitting: Obstetrics and Gynecology

## 2023-12-20 ENCOUNTER — Ambulatory Visit: Admitting: Obstetrics and Gynecology

## 2023-12-20 ENCOUNTER — Other Ambulatory Visit: Payer: Self-pay

## 2023-12-20 DIAGNOSIS — M259 Joint disorder, unspecified: Secondary | ICD-10-CM

## 2023-12-20 DIAGNOSIS — R300 Dysuria: Secondary | ICD-10-CM | POA: Diagnosis not present

## 2023-12-20 NOTE — Progress Notes (Signed)
 Post Partum Visit Note  Brandy [name] is a 28 y.o. [phone] female who presents for a postpartum visit. She is 6 weeks postpartum following a normal spontaneous vaginal delivery.  I have fully reviewed the prenatal and intrapartum course. The delivery was at [redacted]w[redacted]d gestational weeks.  Anesthesia: epidural. Postpartum course has been good. Baby is doing well. Baby is feeding by bottle - Enfamil. Bleeding staining only. Bowel function is normal. Bladder function is normal. Patient is sexually active. Contraception method is Depo-Provera  injections. Postpartum depression screening: negative.  Has been having some burning with urination that started last week, seems to be improving but has not completely resolved.  Reports that after receiving Depo shot in her right arm in the hospital, she has been unable to AB duct the arm well.  She notes that when she tries to pick up her baby from the bedside bassinet, she has difficulty and feels like her arm gets stuck in a position.  She has previously received Depo injection without this issue.  She notes having continued pain since the injection, which is about 1 month.  The pregnancy intention screening data noted above was reviewed. Potential methods of contraception were discussed. The patient elected to proceed with No data recorded.    Health Maintenance Due  Topic Date Due   COVID-19 Vaccine (1 - 2024-25 season) Never done    The following portions of the patient's history were reviewed and updated as appropriate: allergies, current medications, past family history, past medical history, past social history, past surgical history, and problem list.  Review of Systems Pertinent items are noted in HPI.  Objective:  BP 111/68   Pulse 76   Wt 157 lb (71.2 kg)   LMP 02/20/2023   Breastfeeding No   BMI 26.95 kg/m    General:  alert, cooperative, and no distress   Breasts:  not indicated  Lungs: clear to auscultation bilaterally  Heart:   regular rate and rhythm, S1, S2 normal, no murmur, click, rub or gallop  Abdomen: soft, non-tender; bowel sounds normal; no masses,  no organomegaly   Upper extremity Able to fully abduct left arm, demonstrates limited abduction of right arm, able to externally rotate right arm, nontender periscapular area, nontender AC joint, limited abduction  GU exam:  not indicated       Assessment:   1. Postpartum state (Primary) Doing well  2. Shoulder joint dysfunction No obvious findings on exam, referral to sports medicine for further evaluation - AMB referral to sports medicine  3. Dysuria UA with protein and ketones, culture sent given prolonged nature of symptoms - Urine Culture   Routine postpartum exam.   Plan:   Essential components of care per ACOG recommendations:  1.  Mood and well being: Patient with negative depression screening today. Reviewed local resources for support.  - Patient tobacco use? No.   - hx of drug use? No.    2. Infant care and feeding:  -Patient currently breastmilk feeding? No. Singular leaking, no pain -Social determinants of health (SDOH) reviewed in EPIC. No concerns  3. Sexuality, contraception and birth spacing - Patient does not want a pregnancy in the next year.  Desired family size is 4 children.  - Reviewed reproductive life planning. Reviewed contraceptive methods based on pt preferences and effectiveness.  Patient desired Hormonal Injection today.   - Discussed birth spacing of 18 months  4. Sleep and fatigue -Encouraged family/partner/community support of 4 hrs of uninterrupted sleep to help with  mood and fatigue  5. Physical Recovery  - Discussed patients delivery and complications. She describes her labor as good. - Patient had a Vaginal, no problems at delivery. Patient had a none laceration. Perineal healing reviewed. Patient expressed understanding - Patient has urinary incontinence? No. - Patient is safe to resume physical and  sexual activity - 2 days ago, had a little bit of pain/discomfort  6.  Health Maintenance - HM due items addressed Yes - Last pap smear  Diagnosis  Date Value Ref Range Status  05/04/2023   Final   - Negative for intraepithelial lesion or malignancy (NILM)   Pap smear not done at today's visit.  -Breast Cancer screening indicated? No.   7. Chronic Disease/Pregnancy Condition follow up: None  - PCP follow up  Clarence Croak, CMA Center for [employer], [facility]   Kiki Pelton, MD, FACOG Minimally Invasive Gynecologic Surgery  Obstetrics and Gynecology, [facility] for [facility], [facility] 12/20/2023

## 2023-12-21 LAB — POCT URINALYSIS DIP (DEVICE)
Glucose, UA: NEGATIVE mg/dL
Leukocytes,Ua: NEGATIVE
Nitrite: NEGATIVE
Protein, ur: 100 mg/dL — AB
Specific Gravity, Urine: 1.025 (ref 1.005–1.030)
Urobilinogen, UA: 4 mg/dL — ABNORMAL HIGH (ref 0.0–1.0)
pH: 6.5 (ref 5.0–8.0)

## 2023-12-22 ENCOUNTER — Ambulatory Visit: Payer: Self-pay | Admitting: Obstetrics and Gynecology

## 2023-12-22 LAB — URINE CULTURE

## 2023-12-29 NOTE — Progress Notes (Deleted)
   Joanna Muck, PhD, LAT, ATC acting as a scribe for Garlan Juniper, MD.  Brandy [name] is a 27 y.o. female who presents to [facility] at [facility] today for R shoulder pain ongoing since early April. Pt relates pain to starting after her Depo-Prevera injection was administered post-partum. Pt locates pain to ***  Radiates: Aggravates: Treatments tried:  Pertinent review of systems: ***  Relevant historical information: ***   Exam:  There were no vitals taken for this visit. General: Well Developed, well nourished, and in no acute distress.   MSK: ***    Lab and Radiology Results No results found for this or any previous visit (from the past 72 hours). No results found.     Assessment and Plan: 28 y.o. female with ***   PDMP not reviewed this encounter. No orders of the defined types were placed in this encounter.  No orders of the defined types were placed in this encounter.    Discussed warning signs or symptoms. Please see discharge instructions. Patient expresses understanding.   ***

## 2023-12-30 ENCOUNTER — Ambulatory Visit: Admitting: Family Medicine

## 2023-12-30 ENCOUNTER — Encounter (HOSPITAL_COMMUNITY): Payer: Self-pay

## 2023-12-30 ENCOUNTER — Ambulatory Visit (HOSPITAL_COMMUNITY)
Admission: EM | Admit: 2023-12-30 | Discharge: 2023-12-30 | Disposition: A | Discharge status: Home / Self Care | Attending: Emergency Medicine | Admitting: Emergency Medicine

## 2023-12-30 DIAGNOSIS — K0889 Other specified disorders of teeth and supporting structures: Secondary | ICD-10-CM | POA: Diagnosis not present

## 2023-12-30 DIAGNOSIS — K047 Periapical abscess without sinus: Secondary | ICD-10-CM

## 2023-12-30 MED ORDER — CHLORHEXIDINE GLUCONATE 0.12 % MT SOLN: 15.0000 mL | Freq: Two times a day (BID) | OROMUCOSAL | 0 refills | Status: DC

## 2023-12-30 MED ORDER — NYSTATIN 100000 UNIT/ML MT SUSP: 5.0000 mL | Freq: Four times a day (QID) | OROMUCOSAL | 0 refills | Status: DC | PRN

## 2023-12-30 MED ORDER — IBUPROFEN 800 MG PO TABS: 800.0000 mg | ORAL_TABLET | Freq: Three times a day (TID) | ORAL | 0 refills | Status: AC

## 2023-12-30 MED ORDER — CLINDAMYCIN HCL 300 MG PO CAPS: 300.0000 mg | ORAL_CAPSULE | Freq: Three times a day (TID) | ORAL | 0 refills | Status: AC

## 2023-12-30 NOTE — ED Provider Notes (Signed)
 [facility]    CSN: [id number] Arrival date & time: 12/30/23  1258      History   Chief Complaint Chief Complaint  Patient presents with   Dental Pain    HPI Brandy [name] is a 28 y.o. female.   Patient presents with right upper and lower tooth ache that began yesterday.  Patient reports that she does have a dentist appointment on Monday, but states that she has been taking ibuprofen  with minimal relief. Patient reports that her wisdom tooth has come in and is pushing on her tooth causing her to have pain.  Patient is unsure of her LMP, but states that she just had a baby in April and received a Depo injection shortly after that.   The history is provided by the patient and medical records.  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 Authorizing Provider  chlorhexidine (PERIDEX) 0.12 % solution Use as directed 15 mLs in the mouth or throat 2 (two) times daily. 12/30/23  Yes Rosevelt Constable, Amari Burnsworth A, NP   clindamycin  (CLEOCIN ) 300 MG capsule Take 1 capsule (300 mg total) by mouth 3 (three) times daily for 7 days. 12/30/23 01/06/24 Yes Rosevelt Constable, Farris Blash A, NP  ibuprofen  (ADVIL ) 800 MG tablet Take 1 tablet (800 mg total) by mouth 3 (three) times daily. 12/30/23  Yes Levora Reas A, NP  magic mouthwash (nystatin, lidocaine , diphenhydrAMINE ) suspension Take 5 mLs by mouth 4 (four) times daily as needed for mouth pain. 12/30/23  Yes Rosevelt Constable, Dyanne Yorks A, NP  Cholecalciferol (VITAMIN D -3) 125 MCG (5000 UT) TABS Take 1 tablet by mouth daily. Patient not taking: Reported on 12/20/2023 09/20/23 01/18/24  Raynell Caller, MD  cyclobenzaprine  (FLEXERIL ) 5 MG tablet Take 1 tablet (5 mg total) by mouth 3 (three) times daily as needed for muscle spasms. Patient not taking: Reported on 12/20/2023 09/24/23   Salomon Cree, CNM  ondansetron  (ZOFRAN -ODT) 4 MG disintegrating tablet Take 1 tablet (4 mg total) by mouth every 8 (eight) hours as needed for nausea or vomiting. Patient not taking: Reported on 12/20/2023 06/29/23   Zelma Hidden, FNP  ondansetron  (ZOFRAN -ODT) 4 MG disintegrating tablet Take 1 tablet (4 mg total) by mouth every  6 (six) hours as needed for nausea. Patient not taking: Reported on 12/20/2023 10/30/23   Asher Lawn, CNM  pantoprazole  (PROTONIX ) 20 MG tablet Take 1 tablet (20 mg total) by mouth daily. Patient not taking: Reported on 12/20/2023 08/30/23   Granville Layer, MD  Prenatal MV & Min w/FA-DHA (PRENATAL GUMMIES) 0.18-25 MG CHEW Chew by mouth. Patient not taking: Reported on 12/20/2023    [provider]  senna-docusate (SENOKOT-S) 8.6-50 MG tablet Take 2 tablets by mouth daily. Patient not taking: Reported on 12/20/2023 11/07/23   Melanie Spires, MD    Family History Family History  Problem Relation Age of Onset   Asthma Mother    Cancer Neg Hx    Diabetes Neg Hx    Heart disease Neg Hx    Hypertension Neg Hx     Social History Social History   Tobacco Use    Smoking status: Never   Smokeless tobacco: Never  Vaping Use   Vaping status: Never Used  Substance Use Topics   Alcohol use: Not Currently   Drug use: No     Allergies   Kiwi extract, Metronidazole  and other nitroimidazoles, Penicillins, Lobster [shellfish allergy], and Reglan  [metoclopramide ]   Review of Systems Review of Systems  Per HPI  Physical Exam Triage Vital Signs ED Triage Vitals [12/30/23 1351]  Encounter Vitals Group     BP 105/74     Systolic BP Percentile      Diastolic BP Percentile      Pulse Rate 71     Resp 18     Temp 98.7 F (37.1 C)     Temp Source Oral     SpO2 97 %     Weight      Height      Head Circumference      Peak Flow      Pain Score 10     Pain Loc      Pain Education      Exclude from Growth Chart    No data found.  Updated Vital Signs BP 105/74 (BP Location: Left Arm)   Pulse 71   Temp 98.7 F (37.1 C) (Oral)   Resp 18   SpO2 97%   Breastfeeding No   Visual Acuity Right Eye Distance:   Left Eye Distance:   Bilateral Distance:    Right Eye Near:   Left Eye Near:    Bilateral Near:     Physical Exam Vitals and nursing note reviewed.  Constitutional:      General: She is awake. She is not in acute distress.    Appearance: Normal appearance. She is well-developed and well-groomed. She is not ill-appearing.  HENT:     Mouth/Throat:     Dentition: Abnormal dentition. Dental tenderness and gingival swelling present.     Comments: Wisdom tooth has erupted and is pushing against her top right third molar.  There is surrounding swelling and tenderness noted. Skin:    General: Skin is warm and dry.  Neurological:     Mental Status: She is alert.  Psychiatric:        Behavior: Behavior is cooperative.      UC Treatments / Results  Labs (all labs ordered are listed, but only abnormal results are displayed) Labs Reviewed - No data to display  EKG   Radiology No results found.  Procedures Procedures  (including critical care time)  Medications Ordered in UC Medications - No data to display  Initial Impression /  Assessment and Plan / UC Course  I have reviewed the triage vital signs and the nursing notes.  Pertinent labs & imaging results that were available during my care of the patient were reviewed by me and considered in my medical decision making (see chart for details).     Patient is well-appearing.  Vitals are stable.  Upon assessment her wisdom tooth has erupted and is pushing against her top right third molar.  There is surrounding swelling and tenderness noted.  Prescribed clindamycin  for dental infection coverage.  Prescribed ibuprofen  as needed for pain.  Recommended taking Tylenol  as needed for breakthrough pain.  Prescribed Peridex for additional infection prevention.  Prescribed Magic mouthwash for additional mouth pain relief.  Discussed importance of keeping appointment with dentist on Monday.  Discussed return precautions. Final Clinical Impressions(s) / UC Diagnoses   Final diagnoses:  Pain, dental  Dental infection   Discharge Instructions      Start taking clindamycin  3 times daily for 7 days for dental infection coverage. Take one 800 mg of ibuprofen  every 8 hours as needed for pain. You can take 650 mg of Tylenol  every 6-8 hours as needed for breakthrough pain. Use Peridex mouthwash twice daily for additional infection prevention. Use Magic mouthwash 4 times daily as needed for mouth pain. Keep your appointment with your dentist on Monday for further evaluation of your dental pain. Return here as needed.  ED Prescriptions     Medication Sig Dispense Auth. Provider   ibuprofen  (ADVIL ) 800 MG tablet Take 1 tablet (800 mg total) by mouth 3 (three) times daily. 21 tablet Levora Reas A, NP   clindamycin  (CLEOCIN ) 300 MG capsule Take 1 capsule (300 mg total) by mouth 3 (three) times daily for 7 days. 21 capsule Rosevelt Constable, Jeffrey Voth A, NP   chlorhexidine  (PERIDEX) 0.12 % solution Use as directed 15 mLs in the mouth or throat 2 (two) times daily. 120 mL Levora Reas A, NP   magic mouthwash (nystatin, lidocaine , diphenhydrAMINE ) suspension Take 5 mLs by mouth 4 (four) times daily as needed for mouth pain. 180 mL Levora Reas A, NP      PDMP not reviewed this encounter.   Levora Reas A, NP 12/30/23 1420

## 2023-12-30 NOTE — ED Triage Notes (Signed)
 Pt c/o rt upper/lower toothache since yesterday. Has dentist appt on Monday. Took ibuprofen  with no relief.

## 2023-12-30 NOTE — Discharge Instructions (Signed)
 Start taking clindamycin  3 times daily for 7 days for dental infection coverage. Take one 800 mg of ibuprofen  every 8 hours as needed for pain. You can take 650 mg of Tylenol  every 6-8 hours as needed for breakthrough pain. Use Peridex mouthwash twice daily for additional infection prevention. Use Magic mouthwash 4 times daily as needed for mouth pain. Keep your appointment with your dentist on Monday for further evaluation of your dental pain. Return here as needed.

## 2024-01-05 DIAGNOSIS — R262 Difficulty in walking, not elsewhere classified: Secondary | ICD-10-CM | POA: Diagnosis not present

## 2024-01-05 DIAGNOSIS — Z3483 Encounter for supervision of other normal pregnancy, third trimester: Secondary | ICD-10-CM | POA: Diagnosis not present

## 2024-01-06 ENCOUNTER — Ambulatory Visit: Admitting: Family Medicine

## 2024-01-06 NOTE — Progress Notes (Deleted)
   Joanna Muck, PhD, LAT, ATC acting as a scribe for Garlan Juniper, MD.  Brandy [name] is a 27 y.o. female who presents to [facility] at [facility] today for R shoulder pain ongoing since early April. Pt relates pain to starting after her Depo-Prevera injection was administered post-partum. Pt locates pain to ***  Radiates: Aggravates: Treatments tried:  Pertinent review of systems: ***  Relevant historical information: ***   Exam:  There were no vitals taken for this visit. General: Well Developed, well nourished, and in no acute distress.   MSK: ***    Lab and Radiology Results No results found for this or any previous visit (from the past 72 hours). No results found.     Assessment and Plan: 28 y.o. female with ***   PDMP not reviewed this encounter. No orders of the defined types were placed in this encounter.  No orders of the defined types were placed in this encounter.    Discussed warning signs or symptoms. Please see discharge instructions. Patient expresses understanding.   ***

## 2024-01-23 ENCOUNTER — Ambulatory Visit (HOSPITAL_COMMUNITY): Admission: EM | Admit: 2024-01-23 | Discharge: 2024-01-23 | Disposition: A | Discharge status: Home / Self Care

## 2024-01-23 ENCOUNTER — Ambulatory Visit (INDEPENDENT_AMBULATORY_CARE_PROVIDER_SITE_OTHER)

## 2024-01-23 ENCOUNTER — Encounter (HOSPITAL_COMMUNITY): Payer: Self-pay

## 2024-01-23 DIAGNOSIS — S0992XA Unspecified injury of nose, initial encounter: Secondary | ICD-10-CM | POA: Diagnosis not present

## 2024-01-23 DIAGNOSIS — S0033XA Contusion of nose, initial encounter: Secondary | ICD-10-CM

## 2024-01-23 NOTE — ED Triage Notes (Signed)
 Patient presenting with nose pain and swelling onset yesterday. States she accidentally got elbowed in the nose by one of her kids while playing.   Prescriptions or OTC medications tried: Yes- Ibuprofen  800 2-4 hours ago   with no relief

## 2024-01-23 NOTE — ED Provider Notes (Signed)
 [facility]  Note:  This document was prepared using Dragon voice recognition software and may include unintentional dictation errors.  MRN: [id number] DOB: Apr 12, 1996  Subjective:   Brandy [name] is a 28 y.o. female presenting for nasal pain, swelling, right facial pain since yesterday.  Patient reports that she was accidentally elbowed in the face by one of her kids while they were playing.  Patient states that she noticed immediate swelling to the bridge of the nose, mild erythema to the right cheek.  Patient reports taking ibuprofen  800 mg 2 to 4 hours prior to arrival in urgent care with minimal to no relief.  Patient concern for nasal fracture.  Patient denies any previous fracture or injury to the nose.  No current facility-administered medications for this encounter.  Current Outpatient Medications:    ibuprofen  (ADVIL ) 800 MG tablet, Take 1 tablet (800 mg total) by mouth 3 (three) times daily., Disp: 21 tablet, Rfl: 0   medroxyPROGESTERone  (DEPO-PROVERA ) 150 MG/ML injection, Inject 150 mg into the muscle every 3 (three) months., Disp: , Rfl:    Allergies  Allergen Reactions   Kiwi Extract Swelling   Metronidazole  And Other Nitroimidazoles Itching   Penicillins Hives and Other (See Comments)    Has patient had a PCN reaction causing immediate rash, facial/tongue/throat swelling, SOB or lightheadedness with hypotension: Yes Has patient had a PCN reaction causing severe rash involving mucus membranes or skin necrosis: No Has patient had a PCN reaction that required hospitalization No Has patient had a PCN reaction occurring within the last 10 years: No If all of the above answers are NO, then may proceed with Cephalosporin use.    Lobster [Shellfish Allergy] Rash   Reglan  [Metoclopramide ] Diarrhea    Past Medical History:  Diagnosis Date   Chlamydia    Heart murmur    Diagnosed as a child   Migraine      Past Surgical History:  Procedure Laterality Date    NO PAST SURGERIES      Family History  Problem Relation Age of Onset   Asthma Mother    Cancer Neg Hx    Diabetes Neg Hx    Heart disease Neg Hx    Hypertension Neg Hx     Social History   Tobacco Use   Smoking status: Never   Smokeless tobacco: Never  Vaping Use   Vaping status: Never Used  Substance Use Topics   Alcohol use: Not Currently   Drug use: No    ROS Refer to HPI for ROS details.  Objective:   Vitals: BP 121/89 (BP Location: Left Arm)   Pulse 80   Temp 99.1 F (37.3 C) (Oral)   Resp 18   Ht 5' 4 (1.626 m)   LMP  (LMP Unknown)   SpO2 97%   Breastfeeding No   BMI 26.95 kg/m   Physical Exam Vitals and nursing note reviewed.  Constitutional:      General: She is not in acute distress.    Appearance: Normal appearance. She is well-developed. She is not ill-appearing or toxic-appearing.  HENT:     Head: Normocephalic and atraumatic.     Nose: Signs of injury (Moderate swelling noted to the bridge of the nose, no significant erythema or laceration.), nasal tenderness and rhinorrhea present. No congestion.     Right Nostril: No epistaxis.     Left Nostril: No epistaxis.     Mouth/Throat:     Mouth: Mucous membranes are moist.   Eyes:  General:        Right eye: No discharge.        Left eye: No discharge.     Extraocular Movements: Extraocular movements intact.     Conjunctiva/sclera: Conjunctivae normal.    Cardiovascular:     Rate and Rhythm: Normal rate.  Pulmonary:     Effort: Pulmonary effort is normal. No respiratory distress.   Musculoskeletal:        General: Normal range of motion.   Skin:    General: Skin is warm and dry.   Neurological:     General: No focal deficit present.     Mental Status: She is alert and oriented to person, place, and time.   Psychiatric:        Mood and Affect: Mood normal.        Behavior: Behavior normal.     Procedures  No results found for this or any previous visit (from the past  24 hours).  DG Nasal Bones Result Date: 01/23/2024 CLINICAL DATA:  Nasal injury EXAM: NASAL BONES - 3+ VIEW COMPARISON:  None Available. FINDINGS: There is no evidence of fracture or other bone abnormality. IMPRESSION: Negative. Electronically Signed   By: JONETTA Faes M.D.   On: 01/23/2024 15:54     Assessment and Plan :     Discharge Instructions       1. Contusion of nose, initial encounter (Primary) - DG Nasal Bones x-ray completed in UC shows no acute fracture to the nasal bone, most likely soft tissue contusion. - Continue taking ibuprofen  800 mg every 6-8 hours as needed for pain and inflammation to the nose. - Apply ice or cool compress over the bridge of the nose 2-3 times a day for 10 to 15 minutes at a time. - Swelling should begin to subside over the next 2 to 3 days.  Do not be surprised if you encounter some bruising around the nose and underneath the eyes. -Continue to monitor symptoms for any change in severity if there is any escalation of current symptoms or development of new symptoms follow-up in ER for further evaluation and management.      Kamauri Denardo B Ash Mcelwain   [place] Winslow, Gordon B, [place] 01/23/24 [phone]

## 2024-01-23 NOTE — Discharge Instructions (Signed)
  1. Contusion of nose, initial encounter (Primary) - DG Nasal Bones x-ray completed in UC shows no acute fracture to the nasal bone, most likely soft tissue contusion. - Continue taking ibuprofen  800 mg every 6-8 hours as needed for pain and inflammation to the nose. - Apply ice or cool compress over the bridge of the nose 2-3 times a day for 10 to 15 minutes at a time. - Swelling should begin to subside over the next 2 to 3 days.  Do not be surprised if you encounter some bruising around the nose and underneath the eyes. -Continue to monitor symptoms for any change in severity if there is any escalation of current symptoms or development of new symptoms follow-up in ER for further evaluation and management.

## 2024-01-27 ENCOUNTER — Ambulatory Visit (INDEPENDENT_AMBULATORY_CARE_PROVIDER_SITE_OTHER)

## 2024-01-27 ENCOUNTER — Other Ambulatory Visit: Payer: Self-pay

## 2024-01-27 VITALS — BP 110/78 | HR 76 | Ht 64.0 in | Wt 158.2 lb

## 2024-01-27 DIAGNOSIS — Z3042 Encounter for surveillance of injectable contraceptive: Secondary | ICD-10-CM

## 2024-01-27 MED ORDER — MEDROXYPROGESTERONE ACETATE 150 MG/ML IM SUSY
150.0000 mg | PREFILLED_SYRINGE | Freq: Once | INTRAMUSCULAR | Status: AC
  Administered 2024-01-27: 150 mg via INTRAMUSCULAR

## 2024-01-27 NOTE — Progress Notes (Signed)
 Brandy [name] here for Depo-Provera  Injection. Injection administered without complication. Patient will return in 3 months for next injection between Sept 11 and Sept 25. Next annual visit due 05/2024.   Yvanna Vidas, RN 01/27/2024  10:30 AM

## 2024-02-08 ENCOUNTER — Encounter: Payer: Self-pay | Admitting: Family Medicine

## 2024-03-12 ENCOUNTER — Other Ambulatory Visit: Payer: Self-pay

## 2024-03-12 ENCOUNTER — Encounter (HOSPITAL_COMMUNITY): Payer: Self-pay | Admitting: *Deleted

## 2024-03-12 ENCOUNTER — Emergency Department (HOSPITAL_COMMUNITY)

## 2024-03-12 ENCOUNTER — Emergency Department (HOSPITAL_COMMUNITY)
Admission: EM | Admit: 2024-03-12 | Discharge: 2024-03-12 | Disposition: A | Discharge status: Home / Self Care | Attending: Emergency Medicine | Admitting: Emergency Medicine

## 2024-03-12 DIAGNOSIS — S6992XA Unspecified injury of left wrist, hand and finger(s), initial encounter: Secondary | ICD-10-CM | POA: Diagnosis present

## 2024-03-12 DIAGNOSIS — S62307A Unspecified fracture of fifth metacarpal bone, left hand, initial encounter for closed fracture: Secondary | ICD-10-CM | POA: Insufficient documentation

## 2024-03-12 DIAGNOSIS — W228XXA Striking against or struck by other objects, initial encounter: Secondary | ICD-10-CM | POA: Insufficient documentation

## 2024-03-12 DIAGNOSIS — S52615A Nondisplaced fracture of left ulna styloid process, initial encounter for closed fracture: Secondary | ICD-10-CM | POA: Insufficient documentation

## 2024-03-12 DIAGNOSIS — S62309A Unspecified fracture of unspecified metacarpal bone, initial encounter for closed fracture: Secondary | ICD-10-CM

## 2024-03-12 DIAGNOSIS — S52612A Displaced fracture of left ulna styloid process, initial encounter for closed fracture: Secondary | ICD-10-CM | POA: Diagnosis not present

## 2024-03-12 DIAGNOSIS — Y92009 Unspecified place in unspecified non-institutional (private) residence as the place of occurrence of the external cause: Secondary | ICD-10-CM | POA: Insufficient documentation

## 2024-03-12 MED ORDER — OXYCODONE-ACETAMINOPHEN 5-325 MG PO TABS: 1.0000 | ORAL_TABLET | ORAL | 0 refills | Status: DC | PRN

## 2024-03-12 MED ORDER — OXYCODONE-ACETAMINOPHEN 5-325 MG PO TABS
ORAL_TABLET | ORAL | Status: AC
  Filled 2024-03-12: qty 1

## 2024-03-12 MED ORDER — OXYCODONE-ACETAMINOPHEN 5-325 MG PO TABS
1.0000 | ORAL_TABLET | Freq: Once | ORAL | Status: AC
  Administered 2024-03-12: 1 via ORAL

## 2024-03-12 NOTE — ED Provider Notes (Signed)
 [place] EMERGENCY DEPARTMENT AT [facility] Provider Note   CSN: [id number] Arrival date & time: 03/12/24  0201     Patient presents with: Wrist Pain   Brandy [name] is a 28 y.o. female.   The history is provided by the patient and medical records.  Wrist Pain   28 year old female presenting to the ED with left hand/wrist injury.  States she slipped on something on the floor and smacked her hand against the metal door frame.  States immediate onset of pain and swelling.  She is right-hand dominant.  No intervention tried prior to arrival.  She was given Percocet in triage.  Has 22 mo old but not breastfeeding.  Prior to Admission medications   Medication Sig Start Date End Date Taking? Authorizing Provider  oxyCODONE -acetaminophen  (PERCOCET) 5-325 MG tablet Take 1 tablet by mouth every 4 (four) hours as needed. 03/12/24  Yes Jarold Olam HERO, PA-C  ibuprofen  (ADVIL ) 800 MG tablet Take 1 tablet (800 mg total) by mouth 3 (three) times daily. 12/30/23   Johnie Flaming A, NP  medroxyPROGESTERone  (DEPO-PROVERA ) 150 MG/ML injection Inject 150 mg into the muscle every 3 (three) months.    [provider]    Allergies: Kiwi extract, Metronidazole  and other nitroimidazoles, Penicillins, Lobster [shellfish allergy], and Reglan  [metoclopramide ]    Review of Systems  Musculoskeletal:  Positive for arthralgias.  All other systems reviewed and are negative.   Updated Vital Signs BP 130/88 (BP Location: Right Arm)   Pulse (!) 126   Temp 98.7 F (37.1 C)   Resp (!) 22   Ht 5' 4 (1.626 m)   Wt 71.8 kg   SpO2 99%   BMI 27.17 kg/m   Physical Exam Vitals and nursing note reviewed.  Constitutional:      Appearance: She is well-developed.  HENT:     Head: Normocephalic and atraumatic.  Eyes:     Conjunctiva/sclera: Conjunctivae normal.     Pupils: Pupils are equal, round, and reactive to light.  Cardiovascular:     Rate and Rhythm: Normal rate and regular  rhythm.     Heart sounds: Normal heart sounds.  Pulmonary:     Effort: Pulmonary effort is normal.     Breath sounds: Normal breath sounds.  Musculoskeletal:        General: Normal range of motion.     Cervical back: Normal range of motion.     Comments: Left hand/wrist with swelling along left 4th/5th proximal metacarpals, swelling over distal ulnar styloid area, radial pulse intact  Skin:    General: Skin is warm and dry.  Neurological:     Mental Status: She is alert and oriented to person, place, and time.     (all labs ordered are listed, but only abnormal results are displayed) Labs Reviewed - No data to display  EKG: None  Radiology: DG Wrist Complete Left Result Date: 03/12/2024 CLINICAL DATA:  Blunt trauma to the left wrist with pain and swelling, initial encounter EXAM: LEFT WRIST - COMPLETE 3+ VIEW COMPARISON:  None Available. FINDINGS: Ulnar styloid fracture is noted with mild displacement. Fractures also noted through the base of the fourth and fifth metatarsals. No other focal abnormality is noted. IMPRESSION: Multiple fractures as described. Electronically Signed   By: Oneil Devonshire M.D.   On: 03/12/2024 03:26     Procedures   Medications Ordered in the ED  oxyCODONE -acetaminophen  (PERCOCET/ROXICET) 5-325 MG per tablet 1 tablet (1 tablet Oral Given 03/12/24 0227)  Medical Decision Making Risk Prescription drug management.   28 year old female presenting to the ED with left hand and wrist injury after striking it against a metal door frame.  Has swelling along the left 4th and 5th proximal metacarpals and the ulnar styloid.  Hand is neurovascularly intact.  X-ray with several nondisplaced fractures.  She is placed in a sugar-tong splint extended down to cover the metacarpal fractures.  Will refer to hand surgery for follow-up.  Short supply pain medication given.  Can return here for new concerns.  Final diagnoses:  Closed  nondisplaced fracture of styloid process of left ulna, initial encounter  Multiple closed fractures of multiple metacarpal bones, initial encounter    ED Discharge Orders          Ordered    oxyCODONE -acetaminophen  (PERCOCET) 5-325 MG tablet  Every 4 hours PRN        03/12/24 0358               Jarold Olam HERO, PA-C 03/12/24 [id number]    Theadore Ozell HERO, MD 03/12/24 0700

## 2024-03-12 NOTE — Progress Notes (Signed)
 Orthopedic Tech Progress Note Patient Details:  Brandy [name] 11-Feb-1996 [id number]  Ortho Devices Type of Ortho Device: Sling immobilizer, Sugartong splint Ortho Device/Splint Interventions: Ordered, Application, Adjustment   Post Interventions Patient Tolerated: Fair Instructions Provided: Care of device, Adjustment of device Long sugartong applied to provide support for fractures of metacarpals. [name] 03/12/2024, 4:56 AM

## 2024-03-12 NOTE — Discharge Instructions (Addendum)
 As we discussed, you have a few fractures in the left hand/wrist. Leave splint in place for now. Follow-up with hand surgery--- I would call the office on Monday to get appointment scheduled. Take the prescribed medication as directed.  Can take motrin  with this if needed (up to 800mg  3x daily). Return to the ED for new or worsening symptoms.

## 2024-03-12 NOTE — ED Triage Notes (Signed)
 The pt fell approx one hour ago at home and when she tried to catch her balance she struck her lt wrist on the door frame painful

## 2024-03-15 DIAGNOSIS — S62317A Displaced fracture of base of fifth metacarpal bone. left hand, initial encounter for closed fracture: Secondary | ICD-10-CM | POA: Diagnosis not present

## 2024-03-15 DIAGNOSIS — S52615A Nondisplaced fracture of left ulna styloid process, initial encounter for closed fracture: Secondary | ICD-10-CM | POA: Diagnosis not present

## 2024-03-15 DIAGNOSIS — M25642 Stiffness of left hand, not elsewhere classified: Secondary | ICD-10-CM | POA: Diagnosis not present

## 2024-03-15 DIAGNOSIS — M79645 Pain in left finger(s): Secondary | ICD-10-CM | POA: Diagnosis not present

## 2024-03-15 DIAGNOSIS — S62345A Nondisplaced fracture of base of fourth metacarpal bone, left hand, initial encounter for closed fracture: Secondary | ICD-10-CM | POA: Diagnosis not present

## 2024-03-17 ENCOUNTER — Ambulatory Visit (HOSPITAL_COMMUNITY)

## 2024-03-22 DIAGNOSIS — S62317D Displaced fracture of base of fifth metacarpal bone. left hand, subsequent encounter for fracture with routine healing: Secondary | ICD-10-CM | POA: Diagnosis not present

## 2024-03-22 DIAGNOSIS — S62345D Nondisplaced fracture of base of fourth metacarpal bone, left hand, subsequent encounter for fracture with routine healing: Secondary | ICD-10-CM | POA: Diagnosis not present

## 2024-03-22 DIAGNOSIS — S52615D Nondisplaced fracture of left ulna styloid process, subsequent encounter for closed fracture with routine healing: Secondary | ICD-10-CM | POA: Diagnosis not present

## 2024-04-18 ENCOUNTER — Ambulatory Visit

## 2024-04-24 ENCOUNTER — Ambulatory Visit (INDEPENDENT_AMBULATORY_CARE_PROVIDER_SITE_OTHER)

## 2024-04-24 ENCOUNTER — Other Ambulatory Visit (HOSPITAL_COMMUNITY)
Admission: RE | Admit: 2024-04-24 | Discharge: 2024-04-24 | Disposition: A | Discharge status: Home / Self Care | Source: Ambulatory Visit | Attending: Family Medicine | Admitting: Family Medicine

## 2024-04-24 ENCOUNTER — Other Ambulatory Visit: Payer: Self-pay

## 2024-04-24 VITALS — BP 99/66 | HR 75 | Ht 64.0 in | Wt 161.0 lb

## 2024-04-24 DIAGNOSIS — Z3042 Encounter for surveillance of injectable contraceptive: Secondary | ICD-10-CM | POA: Diagnosis not present

## 2024-04-24 DIAGNOSIS — N760 Acute vaginitis: Secondary | ICD-10-CM | POA: Diagnosis present

## 2024-04-24 DIAGNOSIS — R8290 Unspecified abnormal findings in urine: Secondary | ICD-10-CM

## 2024-04-24 LAB — CERVICOVAGINAL ANCILLARY ONLY
Bacterial Vaginitis (gardnerella): POSITIVE — AB
Candida Glabrata: NEGATIVE
Candida Vaginitis: NEGATIVE
Chlamydia: NEGATIVE
Comment: NEGATIVE
Comment: NEGATIVE
Comment: NEGATIVE
Comment: NEGATIVE
Comment: NEGATIVE
Comment: NORMAL
Neisseria Gonorrhea: NEGATIVE
Trichomonas: NEGATIVE

## 2024-04-24 MED ORDER — PHENAZOPYRIDINE HCL 200 MG PO TABS: 200.0000 mg | ORAL_TABLET | Freq: Three times a day (TID) | ORAL | 0 refills | Status: AC

## 2024-04-24 MED ORDER — MEDROXYPROGESTERONE ACETATE 150 MG/ML IM SUSY
150.0000 mg | PREFILLED_SYRINGE | Freq: Once | INTRAMUSCULAR | Status: AC
  Administered 2024-04-24: 150 mg via INTRAMUSCULAR

## 2024-04-24 MED ORDER — NITROFURANTOIN MONOHYD MACRO 100 MG PO CAPS: 100.0000 mg | ORAL_CAPSULE | Freq: Two times a day (BID) | ORAL | 0 refills | Status: AC

## 2024-04-24 NOTE — Progress Notes (Signed)
 Brandy [name] here for Depo-Provera  Injection. Last dose given 01/27/24. Injection administered without complication. Patient will return in 3 months for next injection between December 8th and December 22nd. Next annual visit due May 2026. Patient to get scheduled at checkout.    Patient also has concerns of vaginal itchiness vaginal odor and urinary frequency for the past 3 days. Patient request to do a urinalysis and vaginal swab with STD screening. UA showed moderate blood, trace protein, urobilinogen 40 E.U/dL, and small leukocytes. Urine cultures sent. Sent prescription Macrobid  and Pyridium  per protocol. Notified patient once urine cultures results, change of antibiotics course may alter and urine may turn red/orange with medication Pyridium . Patient voiced understanding.   Self swab instructions given and specimen obtained. Explained patient will be contacted with any abnormal results.    Rosaline Pendleton, RN 04/24/2024  9:56 AM

## 2024-04-25 DIAGNOSIS — S52615D Nondisplaced fracture of left ulna styloid process, subsequent encounter for closed fracture with routine healing: Secondary | ICD-10-CM | POA: Diagnosis not present

## 2024-04-25 DIAGNOSIS — S62317D Displaced fracture of base of fifth metacarpal bone. left hand, subsequent encounter for fracture with routine healing: Secondary | ICD-10-CM | POA: Diagnosis not present

## 2024-04-25 DIAGNOSIS — S62345D Nondisplaced fracture of base of fourth metacarpal bone, left hand, subsequent encounter for fracture with routine healing: Secondary | ICD-10-CM | POA: Diagnosis not present

## 2024-04-25 LAB — POCT URINALYSIS DIP (DEVICE)
Bilirubin Urine: NEGATIVE
Glucose, UA: NEGATIVE mg/dL
Ketones, ur: NEGATIVE mg/dL
Nitrite: NEGATIVE
Protein, ur: NEGATIVE mg/dL
Specific Gravity, Urine: 1.02 (ref 1.005–1.030)
Urobilinogen, UA: 4 mg/dL — ABNORMAL HIGH (ref 0.0–1.0)
pH: 7 (ref 5.0–8.0)

## 2024-04-26 ENCOUNTER — Ambulatory Visit: Payer: Self-pay | Admitting: Obstetrics and Gynecology

## 2024-04-26 DIAGNOSIS — N760 Acute vaginitis: Secondary | ICD-10-CM

## 2024-04-26 LAB — URINE CULTURE

## 2024-04-26 MED ORDER — CLINDAMYCIN PHOSPHATE 2 % VA CREA: 1.0000 | TOPICAL_CREAM | Freq: Every day | VAGINAL | 0 refills | Status: DC

## 2024-04-27 ENCOUNTER — Other Ambulatory Visit: Payer: Self-pay | Admitting: Obstetrics and Gynecology

## 2024-04-27 DIAGNOSIS — B9689 Other specified bacterial agents as the cause of diseases classified elsewhere: Secondary | ICD-10-CM

## 2024-04-28 ENCOUNTER — Telehealth: Payer: Self-pay | Admitting: Lactation Services

## 2024-04-28 NOTE — Telephone Encounter (Signed)
 PA for Clindamycin  sent through Covermymeds, awaiting determination.    Elvenia Muller (Key: [id number]) PA Case ID #: [id number] Rx #: [id number] Need Help? Call us  at [phone] Status sent iconSent to Plan today Drug Clindamycin  Phosphate 2% cream ePA cloud logo Form [employer] Electronic Prior Authorization Form (2017 NCPDP) Original Claim Info [phone] PA Req, Dr submit ePA at [facility].comOr MD Call [phone], Possible 3 DSSUBMIT 03 Lvl of Service PA# 72 PA TYPE8*[id number]*Please visit [email or web address] for assistance resolvingthe most common patient rejections

## 2024-05-01 MED ORDER — SULFAMETHOXAZOLE-TRIMETHOPRIM 800-160 MG PO TABS: 1.0000 | ORAL_TABLET | Freq: Two times a day (BID) | ORAL | 0 refills | Status: AC

## 2024-05-25 NOTE — Telephone Encounter (Signed)
 Patient notified and agrees to medication. Pharmacy verified [facility] [address].

## 2024-06-16 ENCOUNTER — Other Ambulatory Visit: Payer: Self-pay

## 2024-06-16 ENCOUNTER — Emergency Department (HOSPITAL_COMMUNITY)
Admission: EM | Admit: 2024-06-16 | Discharge: 2024-06-16 | Disposition: A | Discharge status: Home / Self Care | Attending: Emergency Medicine | Admitting: Emergency Medicine

## 2024-06-16 ENCOUNTER — Encounter (HOSPITAL_COMMUNITY): Payer: Self-pay | Admitting: Emergency Medicine

## 2024-06-16 DIAGNOSIS — J329 Chronic sinusitis, unspecified: Secondary | ICD-10-CM | POA: Insufficient documentation

## 2024-06-16 DIAGNOSIS — R059 Cough, unspecified: Secondary | ICD-10-CM | POA: Diagnosis present

## 2024-06-16 DIAGNOSIS — R519 Headache, unspecified: Secondary | ICD-10-CM

## 2024-06-16 LAB — RESP PANEL BY RT-PCR (RSV, FLU A&B, COVID)  RVPGX2
Influenza A by PCR: NEGATIVE
Influenza B by PCR: NEGATIVE
Resp Syncytial Virus by PCR: NEGATIVE
SARS Coronavirus 2 by RT PCR: NEGATIVE

## 2024-06-16 MED ORDER — KETOROLAC TROMETHAMINE 30 MG/ML IJ SOLN
30.0000 mg | Freq: Once | INTRAMUSCULAR | Status: AC
  Administered 2024-06-16: 30 mg via INTRAMUSCULAR
  Filled 2024-06-16: qty 1

## 2024-06-16 MED ORDER — DEXAMETHASONE SOD PHOSPHATE PF 10 MG/ML IJ SOLN
10.0000 mg | Freq: Once | INTRAMUSCULAR | Status: AC
  Administered 2024-06-16: 10 mg via INTRAMUSCULAR

## 2024-06-16 MED ORDER — DOXYCYCLINE HYCLATE 100 MG PO CAPS: 100.0000 mg | ORAL_CAPSULE | Freq: Two times a day (BID) | ORAL | 0 refills | Status: AC

## 2024-06-16 MED ORDER — DOXYCYCLINE HYCLATE 100 MG PO TABS
100.0000 mg | ORAL_TABLET | Freq: Once | ORAL | Status: AC
  Administered 2024-06-16: 100 mg via ORAL
  Filled 2024-06-16: qty 1

## 2024-06-16 NOTE — ED Triage Notes (Signed)
 Pt here for posterior/anterior HA 9/10 x 3-4 days. She feels very congested in her sinuses.  She also endorses a cough and post nasal drainage and some blood when she blows her nose periodically.,

## 2024-06-16 NOTE — ED Provider Notes (Signed)
 [place] [facility]ERGENCY DEPARTMENT AT [facility] Provider Note   CSN: [id number] Arrival date & time: 06/16/24  9141     Patient presents with: No chief complaint on file.   Brandy [name] is a 28 y.o. female presenting to ED with a few days of frontal headache, nasal congestion, dry cough.  Patient reports she is having some thick mucus coming out of her nose, sometimes streaked with blood.  She has a headache mostly in the front of her nose.  She has had a dry cough as well.  Denies sick contacts in the house.  Reports penicillin allergies   HPI     Prior to Admission medications   Medication Sig Start Date End Date Taking? Authorizing Provider  doxycycline  (VIBRAMYCIN ) 100 MG capsule Take 1 capsule (100 mg total) by mouth 2 (two) times daily for 7 days. 06/17/24 06/24/24 Yes Jahara Dail, Donnice PARAS, MD  ibuprofen  (ADVIL ) 800 MG tablet Take 1 tablet (800 mg total) by mouth 3 (three) times daily. 12/30/23   Johnie Flaming A, NP  medroxyPROGESTERone  (DEPO-PROVERA ) 150 MG/ML injection Inject 150 mg into the muscle every 3 (three) months.    [provider]  nitrofurantoin , macrocrystal-monohydrate, (MACROBID ) 100 MG capsule Take 1 capsule (100 mg total) by mouth 2 (two) times daily. 04/24/24   Ajewole, Christana, MD  phenazopyridine  (PYRIDIUM ) 200 MG tablet Take 1 tablet (200 mg total) by mouth 3 (three) times daily with meals. 04/24/24   Jeralyn Crutch, MD  sulfamethoxazole-trimethoprim  (BACTRIM DS) 800-160 MG tablet Take 1 tablet by mouth 2 (two) times daily. 05/01/24   Cleotilde Ronal RAMAN, MD    Allergies: Kiwi extract, Metronidazole  and other nitroimidazoles, Penicillins, Lobster [shellfish allergy], and Reglan  [metoclopramide ]    Review of Systems  Updated Vital Signs BP 118/76 (BP Location: Right Arm)   Pulse 75   Temp 98.1 F (36.7 C) (Oral)   Resp 15   SpO2 95%   Physical Exam Constitutional:      General: She is not in acute distress. HENT:     Head:  Normocephalic and atraumatic.     Comments: Bilateral frontal maxillary sinus tenderness on exam    Mouth/Throat:     Pharynx: No oropharyngeal exudate.  Eyes:     Conjunctiva/sclera: Conjunctivae normal.     Pupils: Pupils are equal, round, and reactive to light.  Cardiovascular:     Rate and Rhythm: Normal rate and regular rhythm.  Pulmonary:     Effort: Pulmonary effort is normal. No respiratory distress.  Abdominal:     General: There is no distension.     Tenderness: There is no abdominal tenderness.  Skin:    General: Skin is warm and dry.  Neurological:     General: No focal deficit present.     Mental Status: She is alert. Mental status is at baseline.  Psychiatric:        Mood and Affect: Mood normal.        Behavior: Behavior normal.     (all labs ordered are listed, but only abnormal results are displayed) Labs Reviewed  RESP PANEL BY RT-PCR (RSV, FLU A&B, COVID)  [id number]    EKG: None  Radiology: No results found.   Procedures   Medications Ordered in the ED  dexamethasone  (DECADRON ) injection 10 mg (has no administration in time range)  ketorolac  (TORADOL ) 30 MG/ML injection 30 mg (has no administration in time range)  doxycycline  (VIBRA -TABS) tablet 100 mg (has no administration in time range)  Medical Decision Making Risk Prescription drug management.   Patient presenting with complaint of frontal headache and congestion.  Most consistent with sinusitis clinically.  No red flags for subarachnoid hemorrhage, meningitis, or other intracranial emergency or CNS infection.  No indication for neuroimaging or lumbar puncture at this time.  Patient does not have evidence of peritonsillar abscess or strep pharyngitis or exudates on oral pharyngeal exam.  Given her purulent reported sinus drainage with blood, I think antibiotics be reasonable, will treat with doxycycline  for 7 days.  She is already using Tylenol  and  ibuprofen .  I recommended Mucinex at home to help thin the secretions.  She verbalized understanding.  Stable for discharge     Final diagnoses:  Sinusitis, unspecified chronicity, unspecified location  Nonintractable headache, unspecified chronicity pattern, unspecified headache type    ED Discharge Orders          Ordered    doxycycline  (VIBRAMYCIN ) 100 MG capsule  2 times daily        06/16/24 0948               Cottie Donnice PARAS, MD 06/16/24 [phone]

## 2024-06-21 DIAGNOSIS — S52615D Nondisplaced fracture of left ulna styloid process, subsequent encounter for closed fracture with routine healing: Secondary | ICD-10-CM | POA: Diagnosis not present

## 2024-06-21 DIAGNOSIS — S62317D Displaced fracture of base of fifth metacarpal bone. left hand, subsequent encounter for fracture with routine healing: Secondary | ICD-10-CM | POA: Diagnosis not present

## 2024-06-21 DIAGNOSIS — S62345D Nondisplaced fracture of base of fourth metacarpal bone, left hand, subsequent encounter for fracture with routine healing: Secondary | ICD-10-CM | POA: Diagnosis not present

## 2024-06-22 ENCOUNTER — Other Ambulatory Visit: Payer: Self-pay | Admitting: Orthopedic Surgery

## 2024-06-23 ENCOUNTER — Other Ambulatory Visit: Payer: Self-pay

## 2024-06-23 ENCOUNTER — Encounter (HOSPITAL_BASED_OUTPATIENT_CLINIC_OR_DEPARTMENT_OTHER): Payer: Self-pay | Admitting: Orthopedic Surgery

## 2024-07-04 ENCOUNTER — Ambulatory Visit (HOSPITAL_BASED_OUTPATIENT_CLINIC_OR_DEPARTMENT_OTHER)
Admission: RE | Admit: 2024-07-04 | Discharge: 2024-07-04 | Disposition: A | Discharge status: Home / Self Care | Attending: Orthopedic Surgery | Admitting: Orthopedic Surgery

## 2024-07-04 ENCOUNTER — Ambulatory Visit (HOSPITAL_BASED_OUTPATIENT_CLINIC_OR_DEPARTMENT_OTHER)

## 2024-07-04 ENCOUNTER — Ambulatory Visit (HOSPITAL_BASED_OUTPATIENT_CLINIC_OR_DEPARTMENT_OTHER): Admitting: Anesthesiology

## 2024-07-04 ENCOUNTER — Other Ambulatory Visit: Payer: Self-pay

## 2024-07-04 ENCOUNTER — Encounter (HOSPITAL_BASED_OUTPATIENT_CLINIC_OR_DEPARTMENT_OTHER): Payer: Self-pay | Admitting: Orthopedic Surgery

## 2024-07-04 ENCOUNTER — Encounter (HOSPITAL_BASED_OUTPATIENT_CLINIC_OR_DEPARTMENT_OTHER)
Admission: RE | Disposition: A | Discharge status: Home / Self Care | Payer: Self-pay | Source: Home / Self Care | Attending: Orthopedic Surgery

## 2024-07-04 DIAGNOSIS — Z01818 Encounter for other preprocedural examination: Secondary | ICD-10-CM

## 2024-07-04 DIAGNOSIS — S52612A Displaced fracture of left ulna styloid process, initial encounter for closed fracture: Secondary | ICD-10-CM | POA: Diagnosis not present

## 2024-07-04 DIAGNOSIS — S52612K Displaced fracture of left ulna styloid process, subsequent encounter for closed fracture with nonunion: Secondary | ICD-10-CM | POA: Diagnosis not present

## 2024-07-04 HISTORY — PX: ORIF ULNAR FRACTURE: SHX5417

## 2024-07-04 LAB — POCT PREGNANCY, URINE: Preg Test, Ur: NEGATIVE

## 2024-07-04 SURGERY — OPEN REDUCTION INTERNAL FIXATION (ORIF) ULNAR FRACTURE
Anesthesia: Regional | Site: Wrist | Laterality: Left

## 2024-07-04 MED ORDER — OXYCODONE HCL 5 MG PO TABS: 5.0000 mg | ORAL_TABLET | Freq: Once | ORAL | Status: DC | PRN

## 2024-07-04 MED ORDER — FENTANYL CITRATE (PF) 100 MCG/2ML IJ SOLN
INTRAMUSCULAR | Status: AC
  Filled 2024-07-04: qty 2

## 2024-07-04 MED ORDER — FENTANYL CITRATE (PF) 100 MCG/2ML IJ SOLN
100.0000 ug | Freq: Once | INTRAMUSCULAR | Status: AC
  Administered 2024-07-04: 50 ug via INTRAVENOUS

## 2024-07-04 MED ORDER — MIDAZOLAM HCL 2 MG/2ML IJ SOLN
INTRAMUSCULAR | Status: AC
  Filled 2024-07-04: qty 2

## 2024-07-04 MED ORDER — MIDAZOLAM HCL (PF) 2 MG/2ML IJ SOLN
INTRAMUSCULAR | Status: DC | PRN
  Administered 2024-07-04: 1 mg via INTRAVENOUS

## 2024-07-04 MED ORDER — KETOROLAC TROMETHAMINE 30 MG/ML IJ SOLN
INTRAMUSCULAR | Status: DC | PRN
  Administered 2024-07-04: 30 mg via INTRAVENOUS

## 2024-07-04 MED ORDER — OXYCODONE HCL 5 MG/5ML PO SOLN: 5.0000 mg | Freq: Once | ORAL | Status: DC | PRN

## 2024-07-04 MED ORDER — ONDANSETRON HCL 4 MG/2ML IJ SOLN
INTRAMUSCULAR | Status: DC | PRN
  Administered 2024-07-04: 4 mg via INTRAVENOUS

## 2024-07-04 MED ORDER — CEFAZOLIN SODIUM-DEXTROSE 2-4 GM/100ML-% IV SOLN
INTRAVENOUS | Status: AC
  Filled 2024-07-04: qty 100

## 2024-07-04 MED ORDER — FENTANYL CITRATE (PF) 100 MCG/2ML IJ SOLN: 25.0000 ug | INTRAMUSCULAR | Status: DC | PRN

## 2024-07-04 MED ORDER — CEFAZOLIN SODIUM-DEXTROSE 2-3 GM-%(50ML) IV SOLR
INTRAVENOUS | Status: DC | PRN
  Administered 2024-07-04: 2 g via INTRAVENOUS

## 2024-07-04 MED ORDER — LIDOCAINE HCL (CARDIAC) PF 100 MG/5ML IV SOSY
PREFILLED_SYRINGE | INTRAVENOUS | Status: DC | PRN
  Administered 2024-07-04: 20 mg via INTRAVENOUS

## 2024-07-04 MED ORDER — DEXMEDETOMIDINE HCL IN NACL 80 MCG/20ML IV SOLN
INTRAVENOUS | Status: DC | PRN
  Administered 2024-07-04: 4 ug via INTRAVENOUS

## 2024-07-04 MED ORDER — AMISULPRIDE (ANTIEMETIC) 5 MG/2ML IV SOLN: 10.0000 mg | Freq: Once | INTRAVENOUS | Status: DC | PRN

## 2024-07-04 MED ORDER — PROPOFOL 500 MG/50ML IV EMUL
INTRAVENOUS | Status: DC | PRN
  Administered 2024-07-04: 100 ug/kg/min via INTRAVENOUS

## 2024-07-04 MED ORDER — MIDAZOLAM HCL (PF) 2 MG/2ML IJ SOLN
2.0000 mg | Freq: Once | INTRAMUSCULAR | Status: AC
  Administered 2024-07-04: 2 mg via INTRAVENOUS

## 2024-07-04 MED ORDER — HYDROCODONE-ACETAMINOPHEN 5-325 MG PO TABS: 1.0000 | ORAL_TABLET | Freq: Four times a day (QID) | ORAL | 0 refills | Status: AC | PRN

## 2024-07-04 MED ORDER — ACETAMINOPHEN 500 MG PO TABS
ORAL_TABLET | ORAL | Status: AC
  Filled 2024-07-04: qty 2

## 2024-07-04 MED ORDER — SODIUM CHLORIDE 0.9 % IV SOLN: 12.5000 mg | INTRAVENOUS | Status: DC | PRN

## 2024-07-04 MED ORDER — BUPIVACAINE-EPINEPHRINE (PF) 0.5% -1:200000 IJ SOLN
INTRAMUSCULAR | Status: DC | PRN
  Administered 2024-07-04: 30 mL via PERINEURAL

## 2024-07-04 MED ORDER — ACETAMINOPHEN 500 MG PO TABS
1000.0000 mg | ORAL_TABLET | Freq: Once | ORAL | Status: AC
  Administered 2024-07-04: 1000 mg via ORAL

## 2024-07-04 MED ORDER — 0.9 % SODIUM CHLORIDE (POUR BTL) OPTIME
TOPICAL | Status: DC | PRN
  Administered 2024-07-04: 300 mL

## 2024-07-04 MED ORDER — LACTATED RINGERS IV SOLN: INTRAVENOUS | Status: DC

## 2024-07-04 SURGICAL SUPPLY — 55 items
BLADE MINI RND TIP GREEN BEAV (BLADE) IMPLANT
BLADE SURG 15 STRL LF DISP TIS (BLADE) ×2 IMPLANT
BNDG COMPR ESMARK 4X3 LF (GAUZE/BANDAGES/DRESSINGS) ×1 IMPLANT
BNDG ELASTIC 2INX 5YD STR LF (GAUZE/BANDAGES/DRESSINGS) ×1 IMPLANT
BNDG ELASTIC 3INX 5YD STR LF (GAUZE/BANDAGES/DRESSINGS) ×1 IMPLANT
BNDG GAUZE DERMACEA FLUFF 4 (GAUZE/BANDAGES/DRESSINGS) ×1 IMPLANT
BNDG STRETCH GAUZE 3IN X12FT (GAUZE/BANDAGES/DRESSINGS) IMPLANT
CHLORAPREP W/TINT 26 (MISCELLANEOUS) ×1 IMPLANT
CORD BIPOLAR FORCEPS 12FT (ELECTRODE) ×1 IMPLANT
COVER BACK TABLE 60X90IN (DRAPES) ×1 IMPLANT
COVER MAYO STAND STRL (DRAPES) ×1 IMPLANT
CUFF TOURN SGL QUICK 18X4 (TOURNIQUET CUFF) ×1 IMPLANT
DRAPE EXTREMITY T 121X128X90 (DISPOSABLE) ×1 IMPLANT
DRAPE OEC MINIVIEW 54X84 (DRAPES) ×1 IMPLANT
DRAPE SURG 17X23 STRL (DRAPES) ×1 IMPLANT
GAUZE SPONGE 4X4 12PLY STRL (GAUZE/BANDAGES/DRESSINGS) ×1 IMPLANT
GAUZE XEROFORM 1X8 LF (GAUZE/BANDAGES/DRESSINGS) ×1 IMPLANT
GLOVE BIO SURGEON STRL SZ7.5 (GLOVE) ×1 IMPLANT
GLOVE BIOGEL PI IND STRL 6.5 (GLOVE) IMPLANT
GLOVE BIOGEL PI IND STRL 7.0 (GLOVE) IMPLANT
GLOVE BIOGEL PI IND STRL 7.5 (GLOVE) IMPLANT
GLOVE BIOGEL PI IND STRL 8 (GLOVE) ×1 IMPLANT
GLOVE BIOGEL PI IND STRL 8.5 (GLOVE) IMPLANT
GLOVE SURG ORTHO 8.0 STRL STRW (GLOVE) IMPLANT
GLOVE SURG SS PI 7.0 STRL IVOR (GLOVE) IMPLANT
GLOVE SURG SYN 7.5 PF PI (GLOVE) IMPLANT
GLOVE SURG SYN 8.0 PF PI (GLOVE) IMPLANT
GOWN STRL REUS W/ TWL LRG LVL3 (GOWN DISPOSABLE) ×1 IMPLANT
GOWN STRL REUS W/ TWL XL LVL3 (GOWN DISPOSABLE) IMPLANT
GOWN STRL REUS W/TWL XL LVL3 (GOWN DISPOSABLE) ×1 IMPLANT
KWIRE DBL TROCAR .035X4 (WIRE) IMPLANT
NDL HYPO 22X1.5 SAFETY MO (MISCELLANEOUS) IMPLANT
NDL HYPO 25X1 1.5 SAFETY (NEEDLE) IMPLANT
PACK BASIN DAY SURGERY FS (CUSTOM PROCEDURE TRAY) ×1 IMPLANT
PAD CAST 3X4 CTTN HI CHSV (CAST SUPPLIES) ×1 IMPLANT
PAD CAST 4YDX4 CTTN HI CHSV (CAST SUPPLIES) IMPLANT
PADDING CAST ABS COTTON 4X4 ST (CAST SUPPLIES) ×1 IMPLANT
SLEEVE SCD COMPRESS KNEE MED (STOCKING) IMPLANT
SLING ARM FOAM STRAP LRG (SOFTGOODS) IMPLANT
SOLN 0.9% NACL POUR BTL 1000ML (IV SOLUTION) ×1 IMPLANT
SPLINT PLASTER CAST XFAST 3X15 (CAST SUPPLIES) IMPLANT
SPLINT PLASTER CAST XFAST 4X15 (CAST SUPPLIES) IMPLANT
STAPLER VISISTAT (STAPLE) IMPLANT
STOCKINETTE 4X48 STRL (DRAPES) ×1 IMPLANT
SUCTION TUBE FRAZIER 10FR DISP (SUCTIONS) ×1 IMPLANT
SUT ETHILON 3 0 PS 1 (SUTURE) IMPLANT
SUT ETHILON 4 0 PS 2 18 (SUTURE) IMPLANT
SUT VIC AB 3-0 PS1 18XBRD (SUTURE) IMPLANT
SUT VIC AB 4-0 PS2 18 (SUTURE) IMPLANT
SUTURE FIBERWR 3-0 18 TAPR NDL (SUTURE) IMPLANT
SYR BULB EAR ULCER 3OZ GRN STR (SYRINGE) ×1 IMPLANT
SYR CONTROL 10ML LL (SYRINGE) IMPLANT
TOWEL GREEN STERILE FF (TOWEL DISPOSABLE) ×2 IMPLANT
TUBE CONNECTING 20X1/4 (TUBING) ×1 IMPLANT
UNDERPAD 30X36 HEAVY ABSORB (UNDERPADS AND DIAPERS) ×1 IMPLANT

## 2024-07-04 NOTE — H&P (Signed)
 Brandy [name] is an 28 y.o. female.   Chief Complaint: ulnar styloid fracture HPI: 28 yo female with left ulnar styloid fracture.  It continues to be painful to her.  She wishes to proceed with surgical fixation of the fracture.  Allergies:  Allergies  Allergen Reactions   Kiwi Extract Swelling   Metronidazole  And Other Nitroimidazoles Itching   Penicillins Hives and Other (See Comments)    Has patient had a PCN reaction causing immediate rash, facial/tongue/throat swelling, SOB or lightheadedness with hypotension: Yes Has patient had a PCN reaction causing severe rash involving mucus membranes or skin necrosis: No Has patient had a PCN reaction that required hospitalization No Has patient had a PCN reaction occurring within the last 10 years: No If all of the above answers are NO, then may proceed with Cephalosporin use.    Lobster [Shellfish Allergy] Rash   Reglan  [Metoclopramide ] Diarrhea    Past Medical History:  Diagnosis Date   Chlamydia    Heart murmur    Diagnosed as a child   Migraine     Past Surgical History:  Procedure Laterality Date   NO PAST SURGERIES      Family History: Family History  Problem Relation Age of Onset   Asthma Mother    Cancer Neg Hx    Diabetes Neg Hx    Heart disease Neg Hx    Hypertension Neg Hx     Social History:   reports that she has never smoked. She has never used smokeless tobacco. She reports that she does not currently use alcohol. She reports that she does not use drugs.  Medications: Medications Prior to Admission  Medication Sig Dispense Refill   ibuprofen  (ADVIL ) 800 MG tablet Take 1 tablet (800 mg total) by mouth 3 (three) times daily. 21 tablet 0   medroxyPROGESTERone  (DEPO-PROVERA ) 150 MG/ML injection Inject 150 mg into the muscle every 3 (three) months.     nitrofurantoin , macrocrystal-monohydrate, (MACROBID ) 100 MG capsule Take 1 capsule (100 mg total) by mouth 2 (two) times daily. 10 capsule 0    phenazopyridine  (PYRIDIUM ) 200 MG tablet Take 1 tablet (200 mg total) by mouth 3 (three) times daily with meals. 6 tablet 0   sulfamethoxazole -trimethoprim  (BACTRIM  DS) 800-160 MG tablet Take 1 tablet by mouth 2 (two) times daily. 10 tablet 0    Results for orders placed or performed during the hospital encounter of 07/04/24 (from the past 48 hours)  Pregnancy, urine POC     Status: None   Collection Time: 07/04/24 11:46 AM  Result Value Ref Range   Preg Test, Ur NEGATIVE NEGATIVE    Comment:        THE SENSITIVITY OF THIS METHODOLOGY IS >20 mIU/mL.     No results found.    Blood pressure 113/70, pulse 77, temperature 98.1 F (36.7 C), temperature source Temporal, resp. rate 13, height 5' 4 (1.626 m), weight 74 kg, SpO2 98%, not currently breastfeeding.  General appearance: alert, cooperative, and appears stated age Head: Normocephalic, without obvious abnormality, atraumatic Neck: supple, symmetrical, trachea midline Extremities: Intact sensation and capillary refill all digits.  +epl/fpl/io.  No wounds.  Skin: Skin color, texture, turgor normal. No rashes or lesions Neurologic: Grossly normal Incision/Wound: none  Assessment/Plan Left ulnar styloid fracture.  Non operative and operative treatment options have been discussed with the patient and patient wishes to proceed with operative treatment. Risks, benefits, and alternatives of surgery have been discussed and the patient agrees with the plan of care.  Samuell Knoble 07/04/2024, 12:44 PM

## 2024-07-04 NOTE — Op Note (Signed)
 NAME: Brandy [name] MEDICAL RECORD NO: [id number] DATE OF BIRTH: 1995/11/21 FACILITY: [facility] LOCATION: [facility] PHYSICIAN: Basilia Stuckert R. Makenze Ellett, MD   OPERATIVE REPORT   DATE OF PROCEDURE: 07/04/24    PREOPERATIVE DIAGNOSIS: Left ulnar styloid nonunion   POSTOPERATIVE DIAGNOSIS: Left ulnar styloid nonunion   PROCEDURE: Open reduction internal fixation left ulnar styloid nonunion   SURGEON:  Franky Curia, M.D.   ASSISTANT: Arley Curia, MD   ANESTHESIA:  Regional with sedation   INTRAVENOUS FLUIDS:  Per anesthesia flow sheet.   ESTIMATED BLOOD LOSS:  Minimal.   COMPLICATIONS:  None.   SPECIMENS:  none   TOURNIQUET TIME:    Total Tourniquet Time Documented: Upper Arm (Left) - 45 minutes Total: Upper Arm (Left) - 45 minutes    DISPOSITION:  Stable to PACU.   INDICATIONS: 28 year old female status post left wrist injury with ulnar styloid base fracture.  This is failed to heal.  It is painful.  She was to proceed with operative reduction and fixation.  Risks, benefits and alternatives of surgery were discussed including the risks of blood loss, infection, damage to nerves, vessels, tendons, ligaments, bone for surgery, need for additional surgery, complications with wound healing, continued pain, stiffness, , nonunion, malunion.  She voiced understanding of these risks and elected to proceed.  OPERATIVE COURSE:  After being identified preoperatively by myself,  the patient and I agreed on the procedure and site of the procedure.  The surgical site was marked.  Surgical consent had been signed. Preoperative IV antibiotic prophylaxis was given. She was transferred to the operating room and placed on the operating table in supine position with the left upper extremity on an arm board.  Sedation was induced by the anesthesiologist. A regional block had been performed by anesthesia in preoperative holding.    Left upper extremity was prepped and draped in normal sterile  orthopedic fashion.  A surgical pause was performed between the surgeons, anesthesia, and operating room staff and all were in agreement as to the patient, procedure, and site of procedure.  Tourniquet at the proximal aspect of the extremity was inflated to 250 mmHg after exsanguination of the arm with an Esmarch bandage.  Incision was made at the ulnar side of the wrist over the ulnar styloid.  This was carried in subcutaneous tissues by spreading technique.  Bipolar electrocautery was used to obtain hemostasis.  Dorsal sensory branch of the ulnar nerve was identified and protected throughout the case.  The periosteum was sharply incised and elevated with the freer elevator.  Ulnar styloid was identified.  The nonunion site was visualized.  The this was freed up and fibrous tissue removed with the curette and rongeurs.  C-arm was used in AP lateral projections to ensure appropriate localization.  A 3-0 FiberWire suture was passed around the ulnar styloid.  This was able to be used to reduce the ulnar styloid back into its footprint.  A drill hole was made through the ulna approximately 2 cm proximal to the ulnar styloid.  The FiberWire suture was passed through this.  Two 0.035 inch K wires were then advanced from the tip of the ulnar styloid across the fracture and into the metaphysis of the distal ulna.  This was confirmed with C arm.  These were placed separately.  The pin was backed up and bent in a U-shape to allow for tamping down.  The second pin was then placed in the same bend made.  The pins were then turned  around and tamped back down into the bone holding the styloid reduced.  The FiberWire was then tied in a figure-of-eight fashion.  C-arm was used in AP and lateral projections to ensure appropriate reduction position of the hardware which was the case.  The wound was irrigated with sterile saline.  Periosteum was repaired back over top of the construct with a running 4-0 Vicryl suture.  The skin was  closed with 4-0 nylon in a horizontal mattress fashion.  It was then dressed with sterile Xeroform and 4 x 4's and wrapped with a Kerlix bandage.  A volar splint was placed and wrapped with Kerlix and Ace bandage.  The the tourniquet was deflated at 45 minutes.  Fingertips were pink with brisk capillary refill after deflation of tourniquet.  The operative  drapes were broken down.  The patient was awoken from anesthesia safely.  She was transferred back to the stretcher and taken to PACU in stable condition.  I will see her back in the office in 1 week for postoperative followup.  I will give her a prescription for Norco 5/325 1 tab PO q6 hours prn pain, dispense #20.   Manal Kreutzer, MD Electronically signed, 07/04/24

## 2024-07-04 NOTE — Progress Notes (Signed)
 Assisted Dr. Lucious with left, axillary, ultrasound guided block. Side rails up, monitors on throughout procedure. See vital signs in flow sheet. Tolerated Procedure well.

## 2024-07-04 NOTE — Transfer of Care (Signed)
 Immediate Anesthesia Transfer of Care Note  Patient: Brandy [name]  Procedure(s) Performed: OPEN REDUCTION INTERNAL FIXATION LEFT ULNAR STYLOID FRACTURE (Left: Wrist)  Patient Location: PACU  Anesthesia Type:MAC combined with regional for post-op pain  Level of Consciousness: awake and patient cooperative  Airway & Oxygen Therapy: Patient Spontanous Breathing and Patient connected to face mask oxygen  Post-op Assessment: Report given to RN and Post -op Vital signs reviewed and stable  Post vital signs: Reviewed and stable  Last Vitals:  Vitals Value Taken Time  BP 120/72 07/04/24 16:39  Temp    Pulse 78 07/04/24 16:40  Resp 30 07/04/24 16:40  SpO2 98 % 07/04/24 16:40  Vitals shown include unfiled device data.  Last Pain:  Vitals:   07/04/24 1202  TempSrc: Temporal  PainSc: 7       Patients Stated Pain Goal: 5 (07/04/24 1202)  Complications: No notable events documented.

## 2024-07-04 NOTE — Anesthesia Procedure Notes (Signed)
 Anesthesia Regional Block: Axillary brachial plexus block   Pre-Anesthetic Checklist: , timeout performed,  Correct Patient, Correct Site, Correct Laterality,  Correct Procedure, Correct Position, site marked,  Risks and benefits discussed,  Surgical consent,  Pre-op evaluation,  At surgeon's request and post-op pain management  Laterality: Left  Prep: chloraprep       Needles:  Injection technique: Single-shot  Needle Type: Echogenic Needle     Needle Length: 5cm  Needle Gauge: 21     Additional Needles:   Narrative:  Start time: 07/04/2024 1:44 PM End time: 07/04/2024 1:47 PM Injection made incrementally with aspirations every 5 mL.  Performed by: Personally  Anesthesiologist: Lucious Debby BRAVO, MD  Additional Notes: No pain on injection. No increased resistance to injection. Injection made in 5cc increments. Good needle visualization. Patient tolerated the procedure well.

## 2024-07-04 NOTE — Op Note (Signed)
 I assisted Surgeons and Role:    * Murrell Drivers, MD - Primary    DEWAINE Murrell Kuba, MD - Assisting on the Procedure(s): OPEN REDUCTION INTERNAL FIXATION LEFT ULNAR STYLOID FRACTURE on 07/04/2024.  I provided assistance on this case as follows: Set up, approach, retraction and protection of the dorsal sensory related to the ulnar nerve, identification of the malunion site of the ulnar, remainder of the nonunion site, placement of pins and tension band wire, closure of the wound and application of the dressing and splint.  Electronically signed by: Kuba Murrell, MD Date: 07/04/2024 Time: 4:32 PM

## 2024-07-04 NOTE — Anesthesia Procedure Notes (Signed)
 Procedure Name: MAC Date/Time: 07/04/2024 3:59 PM  Performed by: Denton Niels CROME, CRNAPre-anesthesia Checklist: Patient identified, Emergency Drugs available, Suction available, Patient being monitored and Timeout performed Oxygen Delivery Method: Simple face mask Placement Confirmation: positive ETCO2 Dental Injury: Teeth and Oropharynx as per pre-operative assessment

## 2024-07-04 NOTE — Anesthesia Preprocedure Evaluation (Addendum)
 Anesthesia Evaluation  Patient identified by MRN, date of birth, ID band Patient awake    Reviewed: Allergy & Precautions, NPO status , Patient's Chart, lab work & pertinent test results  History of Anesthesia Complications Negative for: history of anesthetic complications  Airway Mallampati: II  TM Distance: >3 FB Neck ROM: Full    Dental  (+) Dental Advisory Given, Teeth Intact   Pulmonary neg pulmonary ROS   Pulmonary exam normal        Cardiovascular negative cardio ROS Normal cardiovascular exam   Childhood murmur, asymptomatic    Neuro/Psych  Headaches  negative psych ROS   GI/Hepatic negative GI ROS, Neg liver ROS,,,  Endo/Other  negative endocrine ROS    Renal/GU negative Renal ROS     Musculoskeletal negative musculoskeletal ROS (+)    Abdominal   Peds  Hematology negative hematology ROS (+)   Anesthesia Other Findings HSV  Reproductive/Obstetrics                              Anesthesia Physical Anesthesia Plan  ASA: 1  Anesthesia Plan: Regional   Post-op Pain Management: Regional block* and Tylenol  PO (pre-op)*   Induction:   PONV Risk Score and Plan: 2 and Propofol infusion, Treatment may vary due to age or medical condition, Ondansetron  and Midazolam  Airway Management Planned: Natural Airway and Simple Face Mask  Additional Equipment: None  Intra-op Plan:   Post-operative Plan:   Informed Consent: I have reviewed the patients History and Physical, chart, labs and discussed the procedure including the risks, benefits and alternatives for the proposed anesthesia with the patient or authorized representative who has indicated his/her understanding and acceptance.       Plan Discussed with: CRNA and Anesthesiologist  Anesthesia Plan Comments:          Anesthesia Quick Evaluation

## 2024-07-04 NOTE — Discharge Instructions (Addendum)
 [facility] Center Instructions Hand Surgery  Wound Care: Keep your hand elevated above the level of your heart.  Do not allow it to dangle by your side.  Keep the dressing dry and do not remove it unless your doctor advises you to do so.  He will usually change it at the time of your post-op visit.  Moving your fingers is advised to stimulate circulation but will depend on the site of your surgery.  If you have a splint applied, your doctor will advise you regarding movement.  Activity: Do not drive or operate machinery today.  Rest today and then you may return to your normal activity and work as indicated by your physician.  Diet:  Drink liquids today or eat a light diet.  You may resume a regular diet tomorrow.    General expectations: Pain for two to three days. Fingers may become slightly swollen.  Call your doctor if any of the following occur: Severe pain not relieved by pain medication. Elevated temperature. Dressing soaked with blood. Inability to move fingers. White or bluish color to fingers.    Post Anesthesia Home Care Instructions  Activity: Get plenty of rest for the remainder of the day. A responsible individual must stay with you for 24 hours following the procedure.  For the next 24 hours, DO NOT: -Drive a car -Advertising copywriter -Drink alcoholic beverages -Take any medication unless instructed by your physician -Make any legal decisions or sign important papers.  Meals: Start with liquid foods such as gelatin or soup. Progress to regular foods as tolerated. Avoid greasy, spicy, heavy foods. If nausea and/or vomiting occur, drink only clear liquids until the nausea and/or vomiting subsides. Call your physician if vomiting continues.  Special Instructions/Symptoms: Your throat may feel dry or sore from the anesthesia or the breathing tube placed in your throat during surgery. If this causes discomfort, gargle with warm salt water. The discomfort should disappear  within 24 hours.  If you had a scopolamine  patch placed behind your ear for the management of post- operative nausea and/or vomiting:  1. The medication in the patch is effective for 72 hours, after which it should be removed.  Wrap patch in a tissue and discard in the trash. Wash hands thoroughly with soap and water. 2. You may remove the patch earlier than 72 hours if you experience unpleasant side effects which may include dry mouth, dizziness or visual disturbances. 3. Avoid touching the patch. Wash your hands with soap and water after contact with the patch.  Regional Anesthesia Blocks  1. You may not be able to move or feel the blocked extremity after a regional anesthetic block. This may last may last from 3-48 hours after placement, but it will go away. The length of time depends on the medication injected and your individual response to the medication. As the nerves start to wake up, you may experience tingling as the movement and feeling returns to your extremity. If the numbness and inability to move your extremity has not gone away after 48 hours, please call your surgeon.   2. The extremity that is blocked will need to be protected until the numbness is gone and the strength has returned. Because you cannot feel it, you will need to take extra care to avoid injury. Because it may be weak, you may have difficulty moving it or using it. You may not know what position it is in without looking at it while the block is in effect.  3.  For blocks in the legs and feet, returning to weight bearing and walking needs to be done carefully. You will need to wait until the numbness is entirely gone and the strength has returned. You should be able to move your leg and foot normally before you try and bear weight or walk. You will need someone to be with you when you first try to ensure you do not fall and possibly risk injury.  4. Bruising and tenderness at the needle site are common side effects  and will resolve in a few days.  5. Persistent numbness or new problems with movement should be communicated to the surgeon or the [facility] [phone] [facility] [phone]).  No tylenol  until after 6:10pm today.  No ibuprofen  until after 1230am.

## 2024-07-05 ENCOUNTER — Encounter (HOSPITAL_BASED_OUTPATIENT_CLINIC_OR_DEPARTMENT_OTHER): Payer: Self-pay | Admitting: Orthopedic Surgery

## 2024-07-05 NOTE — Anesthesia Postprocedure Evaluation (Signed)
 Anesthesia Post Note  Patient: Brandy [name]  Procedure(s) Performed: OPEN REDUCTION INTERNAL FIXATION LEFT ULNAR STYLOID FRACTURE (Left: Wrist)     Patient location during evaluation: PACU Anesthesia Type: Regional Level of consciousness: awake and alert Pain management: pain level controlled Vital Signs Assessment: post-procedure vital signs reviewed and stable Respiratory status: spontaneous breathing, nonlabored ventilation and respiratory function stable Cardiovascular status: stable and blood pressure returned to baseline Anesthetic complications: no   No notable events documented.  Last Vitals:  Vitals:   07/04/24 1700 07/04/24 1711  BP: 109/64 103/64  Pulse: (!) 58 (!) 58  Resp: 18 18  Temp:  36.6 C  SpO2: 98% 98%    Last Pain:  Vitals:   07/04/24 1711  TempSrc:   PainSc: 0-No pain                 Debby FORBES Like

## 2024-07-10 ENCOUNTER — Ambulatory Visit

## 2024-07-11 DIAGNOSIS — M25642 Stiffness of left hand, not elsewhere classified: Secondary | ICD-10-CM | POA: Diagnosis not present

## 2024-07-11 DIAGNOSIS — M79645 Pain in left finger(s): Secondary | ICD-10-CM | POA: Diagnosis not present

## 2024-07-11 DIAGNOSIS — S52615D Nondisplaced fracture of left ulna styloid process, subsequent encounter for closed fracture with routine healing: Secondary | ICD-10-CM | POA: Diagnosis not present
